# Patient Record
Sex: Female | Born: 1970
Health system: Southern US, Community
[De-identification: ages and names within clinical notes are randomized; demographics above are authoritative.]

## PROBLEM LIST (undated history)

## (undated) DIAGNOSIS — C50919 Malignant neoplasm of unspecified site of unspecified female breast: Secondary | ICD-10-CM

## (undated) DIAGNOSIS — F32A Depression, unspecified: Secondary | ICD-10-CM

## (undated) DIAGNOSIS — K589 Irritable bowel syndrome without diarrhea: Secondary | ICD-10-CM

## (undated) DIAGNOSIS — K219 Gastro-esophageal reflux disease without esophagitis: Secondary | ICD-10-CM

## (undated) DIAGNOSIS — F419 Anxiety disorder, unspecified: Secondary | ICD-10-CM

## (undated) DIAGNOSIS — T7840XA Allergy, unspecified, initial encounter: Secondary | ICD-10-CM

## (undated) DIAGNOSIS — Z86010 Personal history of colonic polyps: Secondary | ICD-10-CM

## (undated) DIAGNOSIS — G4730 Sleep apnea, unspecified: Secondary | ICD-10-CM

## (undated) DIAGNOSIS — H9190 Unspecified hearing loss, unspecified ear: Secondary | ICD-10-CM

## (undated) DIAGNOSIS — E669 Obesity, unspecified: Secondary | ICD-10-CM

## (undated) DIAGNOSIS — F329 Major depressive disorder, single episode, unspecified: Secondary | ICD-10-CM

## (undated) HISTORY — DX: Anxiety disorder, unspecified: F41.9

## (undated) HISTORY — DX: Allergy, unspecified, initial encounter: T78.40XA

## (undated) HISTORY — DX: Personal history of colonic polyps: Z86.010

## (undated) HISTORY — DX: Obesity, unspecified: E66.9

## (undated) HISTORY — DX: Malignant neoplasm of unspecified site of unspecified female breast: C50.919

## (undated) HISTORY — DX: Gastro-esophageal reflux disease without esophagitis: K21.9

## (undated) HISTORY — DX: Sleep apnea, unspecified: G47.30

## (undated) HISTORY — DX: Irritable bowel syndrome, unspecified: K58.9

## (undated) HISTORY — PX: BREAST SURGERY: SHX581

## (undated) HISTORY — DX: Unspecified hearing loss, unspecified ear: H91.90

## (undated) HISTORY — DX: Major depressive disorder, single episode, unspecified: F32.9

## (undated) HISTORY — DX: Depression, unspecified: F32.A

---

## 2000-10-06 ENCOUNTER — Other Ambulatory Visit: Admission: RE | Admit: 2000-10-06 | Discharge: 2000-10-06 | Payer: Self-pay | Admitting: Family Medicine

## 2001-10-08 ENCOUNTER — Other Ambulatory Visit: Admission: RE | Admit: 2001-10-08 | Discharge: 2001-10-08 | Payer: Self-pay | Admitting: Family Medicine

## 2002-02-14 ENCOUNTER — Inpatient Hospital Stay (HOSPITAL_COMMUNITY): Admission: AD | Admit: 2002-02-14 | Discharge: 2002-02-14 | Payer: Self-pay | Admitting: Obstetrics and Gynecology

## 2002-02-14 ENCOUNTER — Encounter: Payer: Self-pay | Admitting: Obstetrics and Gynecology

## 2002-02-17 ENCOUNTER — Encounter: Payer: Self-pay | Admitting: Obstetrics & Gynecology

## 2002-02-17 ENCOUNTER — Inpatient Hospital Stay (HOSPITAL_COMMUNITY): Admission: AD | Admit: 2002-02-17 | Discharge: 2002-02-17 | Payer: Self-pay | Admitting: Obstetrics & Gynecology

## 2002-03-25 HISTORY — PX: OTHER SURGICAL HISTORY: SHX169

## 2002-06-17 ENCOUNTER — Encounter (INDEPENDENT_AMBULATORY_CARE_PROVIDER_SITE_OTHER): Payer: Self-pay | Admitting: Specialist

## 2002-06-17 ENCOUNTER — Ambulatory Visit (HOSPITAL_COMMUNITY): Admission: RE | Admit: 2002-06-17 | Discharge: 2002-06-18 | Payer: Self-pay | Admitting: *Deleted

## 2002-11-09 ENCOUNTER — Other Ambulatory Visit: Admission: RE | Admit: 2002-11-09 | Discharge: 2002-11-09 | Payer: Self-pay | Admitting: Family Medicine

## 2003-01-07 ENCOUNTER — Other Ambulatory Visit: Admission: RE | Admit: 2003-01-07 | Discharge: 2003-01-07 | Payer: Self-pay | Admitting: Obstetrics and Gynecology

## 2003-08-29 ENCOUNTER — Inpatient Hospital Stay (HOSPITAL_COMMUNITY): Admission: AD | Admit: 2003-08-29 | Discharge: 2003-09-01 | Payer: Self-pay | Admitting: Obstetrics and Gynecology

## 2003-08-29 ENCOUNTER — Encounter (INDEPENDENT_AMBULATORY_CARE_PROVIDER_SITE_OTHER): Payer: Self-pay | Admitting: Specialist

## 2003-09-02 ENCOUNTER — Encounter: Admission: RE | Admit: 2003-09-02 | Discharge: 2003-10-02 | Payer: Self-pay | Admitting: Obstetrics and Gynecology

## 2003-11-02 ENCOUNTER — Encounter: Admission: RE | Admit: 2003-11-02 | Discharge: 2003-12-02 | Payer: Self-pay | Admitting: Obstetrics and Gynecology

## 2003-12-03 ENCOUNTER — Encounter: Admission: RE | Admit: 2003-12-03 | Discharge: 2004-01-02 | Payer: Self-pay | Admitting: Obstetrics and Gynecology

## 2004-02-02 ENCOUNTER — Encounter: Admission: RE | Admit: 2004-02-02 | Discharge: 2004-03-03 | Payer: Self-pay | Admitting: Obstetrics and Gynecology

## 2004-04-03 ENCOUNTER — Encounter: Admission: RE | Admit: 2004-04-03 | Discharge: 2004-05-03 | Payer: Self-pay | Admitting: Obstetrics and Gynecology

## 2004-05-04 ENCOUNTER — Encounter: Admission: RE | Admit: 2004-05-04 | Discharge: 2004-06-03 | Payer: Self-pay | Admitting: Obstetrics and Gynecology

## 2004-07-02 ENCOUNTER — Encounter: Admission: RE | Admit: 2004-07-02 | Discharge: 2004-08-01 | Payer: Self-pay | Admitting: Obstetrics and Gynecology

## 2006-01-31 ENCOUNTER — Ambulatory Visit: Payer: Self-pay | Admitting: Internal Medicine

## 2006-02-03 ENCOUNTER — Ambulatory Visit: Payer: Self-pay | Admitting: Internal Medicine

## 2006-04-08 ENCOUNTER — Encounter (INDEPENDENT_AMBULATORY_CARE_PROVIDER_SITE_OTHER): Payer: Self-pay | Admitting: Specialist

## 2006-04-08 ENCOUNTER — Ambulatory Visit (HOSPITAL_COMMUNITY): Admission: RE | Admit: 2006-04-08 | Discharge: 2006-04-08 | Payer: Self-pay | Admitting: General Surgery

## 2007-03-26 HISTORY — PX: CHOLECYSTECTOMY: SHX55

## 2007-05-10 ENCOUNTER — Emergency Department (HOSPITAL_COMMUNITY): Admission: EM | Admit: 2007-05-10 | Discharge: 2007-05-10 | Payer: Self-pay | Admitting: Family Medicine

## 2007-05-15 ENCOUNTER — Emergency Department (HOSPITAL_COMMUNITY): Admission: EM | Admit: 2007-05-15 | Discharge: 2007-05-15 | Payer: Self-pay | Admitting: Family Medicine

## 2007-06-08 ENCOUNTER — Encounter: Payer: Self-pay | Admitting: Cardiology

## 2007-06-08 LAB — CONVERTED CEMR LAB
BUN: 14 mg/dL (ref 6–23)
CO2: 30 meq/L (ref 19–32)
Calcium: 8.8 mg/dL (ref 8.4–10.5)
Chloride: 104 meq/L (ref 96–112)
Creatinine, Ser: 0.6 mg/dL (ref 0.4–1.2)
GFR calc Af Amer: 145 mL/min
GFR calc non Af Amer: 120 mL/min
Glucose, Bld: 105 mg/dL — ABNORMAL HIGH (ref 70–99)
Magnesium: 2.1 mg/dL (ref 1.5–2.5)
Potassium: 4.1 meq/L (ref 3.5–5.1)
Sodium: 139 meq/L (ref 135–145)
TSH: 1.71 microintl units/mL (ref 0.35–5.50)

## 2008-07-05 ENCOUNTER — Inpatient Hospital Stay (HOSPITAL_COMMUNITY): Admission: AD | Admit: 2008-07-05 | Discharge: 2008-07-05 | Payer: Self-pay | Admitting: Obstetrics

## 2008-10-12 ENCOUNTER — Encounter: Admission: RE | Admit: 2008-10-12 | Discharge: 2008-10-12 | Payer: Self-pay | Admitting: Obstetrics and Gynecology

## 2008-12-06 ENCOUNTER — Inpatient Hospital Stay (HOSPITAL_COMMUNITY): Admission: RE | Admit: 2008-12-06 | Discharge: 2008-12-09 | Payer: Self-pay | Admitting: Obstetrics and Gynecology

## 2008-12-10 ENCOUNTER — Encounter: Admission: RE | Admit: 2008-12-10 | Discharge: 2009-01-08 | Payer: Self-pay | Admitting: Obstetrics and Gynecology

## 2009-01-09 ENCOUNTER — Encounter: Admission: RE | Admit: 2009-01-09 | Discharge: 2009-02-08 | Payer: Self-pay | Admitting: Obstetrics and Gynecology

## 2009-02-09 ENCOUNTER — Encounter: Admission: RE | Admit: 2009-02-09 | Discharge: 2009-03-10 | Payer: Self-pay | Admitting: Obstetrics and Gynecology

## 2009-03-11 ENCOUNTER — Encounter: Admission: RE | Admit: 2009-03-11 | Discharge: 2009-03-23 | Payer: Self-pay | Admitting: Obstetrics and Gynecology

## 2009-04-11 ENCOUNTER — Encounter: Admission: RE | Admit: 2009-04-11 | Discharge: 2009-05-11 | Payer: Self-pay | Admitting: Obstetrics and Gynecology

## 2009-04-21 ENCOUNTER — Emergency Department (HOSPITAL_COMMUNITY): Admission: EM | Admit: 2009-04-21 | Discharge: 2009-04-21 | Payer: Self-pay | Admitting: Family Medicine

## 2009-05-12 ENCOUNTER — Encounter: Admission: RE | Admit: 2009-05-12 | Discharge: 2009-06-11 | Payer: Self-pay | Admitting: Obstetrics and Gynecology

## 2009-07-10 ENCOUNTER — Encounter: Admission: RE | Admit: 2009-07-10 | Discharge: 2009-08-09 | Payer: Self-pay | Admitting: Obstetrics and Gynecology

## 2009-08-10 ENCOUNTER — Encounter: Admission: RE | Admit: 2009-08-10 | Discharge: 2009-09-09 | Payer: Self-pay | Admitting: Obstetrics and Gynecology

## 2009-09-10 ENCOUNTER — Encounter: Admission: RE | Admit: 2009-09-10 | Discharge: 2009-10-10 | Payer: Self-pay | Admitting: Obstetrics and Gynecology

## 2009-10-11 ENCOUNTER — Encounter: Admission: RE | Admit: 2009-10-11 | Discharge: 2009-11-06 | Payer: Self-pay | Admitting: Obstetrics and Gynecology

## 2010-01-23 ENCOUNTER — Encounter: Payer: Self-pay | Admitting: Internal Medicine

## 2010-01-23 LAB — CONVERTED CEMR LAB
ALT: 15 units/L
AST: 15 units/L
Albumin: 4.3 g/dL
Alkaline Phosphatase: 66 units/L
BUN: 10 mg/dL
CO2: 25 meq/L
Calcium: 9.3 mg/dL
Chloride: 101 meq/L
Cholesterol: 147 mg/dL
Creatinine, Ser: 0.6 mg/dL
Glucose, Bld: 86 mg/dL
HDL: 41 mg/dL
Hemoglobin: 13.4 g/dL
LDL Cholesterol: 84 mg/dL
Platelets: 365 10*3/uL
Potassium: 4.4 meq/L
Sodium: 137 meq/L
TSH: 1.3 microintl units/mL
Total Bilirubin: 0.5 mg/dL
Total Protein: 7 g/dL
Triglyceride fasting, serum: 110 mg/dL
WBC: 8.4 10*3/uL

## 2010-02-26 ENCOUNTER — Telehealth: Payer: Self-pay | Admitting: Internal Medicine

## 2010-03-06 ENCOUNTER — Ambulatory Visit: Payer: Self-pay | Admitting: Internal Medicine

## 2010-03-06 DIAGNOSIS — K589 Irritable bowel syndrome without diarrhea: Secondary | ICD-10-CM | POA: Insufficient documentation

## 2010-03-06 DIAGNOSIS — R109 Unspecified abdominal pain: Secondary | ICD-10-CM | POA: Insufficient documentation

## 2010-03-06 DIAGNOSIS — E669 Obesity, unspecified: Secondary | ICD-10-CM | POA: Insufficient documentation

## 2010-03-14 ENCOUNTER — Ambulatory Visit: Payer: Self-pay | Admitting: Internal Medicine

## 2010-03-14 DIAGNOSIS — H9190 Unspecified hearing loss, unspecified ear: Secondary | ICD-10-CM | POA: Insufficient documentation

## 2010-04-12 ENCOUNTER — Encounter: Payer: Self-pay | Admitting: Internal Medicine

## 2010-04-18 ENCOUNTER — Encounter: Payer: Self-pay | Admitting: Internal Medicine

## 2010-04-24 NOTE — Progress Notes (Signed)
Summary: Triage  Phone Note Call from Patient Call back at [phone] Cell in Epic training this morning   Caller: Patient Call For: Dr. Leone Payor Reason for Call: Talk to Nurse Summary of Call: having upper abd pain radiating to her back...requesting sooner appt. than her appt on 04-13-10  Initial call taken by: Karna Christmas,  February 26, 2010 9:12 AM  Follow-up for Phone Call        Spoke with patient and she is concerned because she is having pain where her "gallbladder used to be." States she is under a lot of stress right now and would like to be seen sooner than her January appointment. Offered an appointment with the NP but she states she wants to be seen by the Doctor. Gave patient an appointment to see Dr. Leone Payor 03/06/10 at 10:45am. Selinda Michaels, RN Follow-up by: Darcey Nora RN, CGRN,  February 26, 2010 9:41 AM

## 2010-04-26 NOTE — Assessment & Plan Note (Signed)
Summary: new pt/umr/#/lb   Vital Signs:  Patient profile:   40 year old female Height:      64 inches (162.56 cm) Weight:      209.8 pounds (95.36 kg) O2 Sat:      95 % on Room air Temp:     98.7 degrees F (37.06 degrees C) oral Pulse rate:   81 / minute BP sitting:   112 / 72  (left arm) Cuff size:   large  Vitals Entered By: Orlan Leavens RMA (March 14, 2010 3:05 PM)  O2 Flow:  Room air CC: New patient Is Patient Diabetic? No Pain Assessment Patient in pain? no        Primary Care Provider:  Josephine Cables, MD   CC:  New patient.  History of Present Illness: new pt to me and our division, here to est care  1) obesity - long hx same, worse since pregnancy and diet/cooking change complicated by limited time for exercise due to stress/job, etc planning diet changes and increased exercise efforts -  has never tried rx med for appetite suppression but would consider trying same +gest DM and FH DM, no OSA, OA - no thyroid hx   2) IBS symptoms - seeing GI for same - has not yet tried hycosamine, currently symptoms improved  3) anxiety - occ panic attacks - symptoms worse when under stress never on rx meds or counseling for same planning to work on healthier balance of time mgmt for better control of symptoms   Preventive Screening-Counseling & Management  Alcohol-Tobacco     Alcohol drinks/day: <1     Alcohol type: wine     Alcohol Counseling: not indicated; use of alcohol is not excessive or problematic     Smoking Status: never     Tobacco Counseling: not indicated; no tobacco use  Caffeine-Diet-Exercise     Diet Counseling: to improve diet; diet is suboptimal     Does Patient Exercise: no     Exercise Counseling: to improve exercise regimen     Depression Counseling: not indicated; screening negative for depression  Safety-Violence-Falls     Seat Belt Counseling: not indicated; patient wears seat belts     Helmet Counseling: not indicated; patient wears  helmet when riding bicycle/motocycle     Firearm Counseling: not indicated; uses recommended firearm safety measures     Violence Counseling: not indicated; no violence risk noted     Fall Risk Counseling: not indicated; no significant falls noted  Clinical Review Panels:  Immunizations   Last Tetanus Booster:  Historical (03/26/2003)   Last Flu Vaccine:  Historical (01/23/2010)  Lipid Management   Cholesterol:  147 (01/23/2010)   LDL (bad choesterol):  84 (01/23/2010)   HDL (good cholesterol):  41.0 (01/23/2010)   Triglycerides:  110 (01/23/2010)  Diabetes Management   Creatinine:  0.6 (01/23/2010)   Last Flu Vaccine:  Historical (01/23/2010)  CBC   WBC:  8.4 (01/23/2010)   Hgb:  13.4 (01/23/2010)   Platelets:  365 (01/23/2010)  Complete Metabolic Panel   Glucose:  86 (01/23/2010)   Sodium:  137 (01/23/2010)   Potassium:  4.4 (01/23/2010)   Chloride:  101 (01/23/2010)   CO2:  25 (01/23/2010)   BUN:  10 (01/23/2010)   Creatinine:  0.6 (01/23/2010)   Albumin:  4.3 (01/23/2010)   Total Protein:  7.0 (01/23/2010)   Calcium:  9.3 (01/23/2010)   Total Bili:  0.5 (01/23/2010)   Alk Phos:  66 (01/23/2010)  SGPT (ALT):  15 (01/23/2010)   SGOT (AST):  15 (01/23/2010)   -  Date:  01/23/2010    Cholesterol: 147    LDL: 84    HDL: 41.0    Triglycerides: 110    TSH: 1.3    BG Random: 86    BUN: 10    Creatinine: 0.6    Sodium: 137    Potassium: 4.4    Chloride: 101    CO2 Total: 25    SGOT (AST): 15    SGPT (ALT): 15    T. Bilirubin: 0.5    Alk Phos: 66    Calcium: 9.3    Total Protein: 7.0    Albumin: 4.3    WBC: 8.4    HGB: 13.4    PLT: 365  Current Medications (verified): 1)  Tums 500 Mg Chew (Calcium Carbonate Antacid) .... 4-6 As Needed 2)  One-A-Day Womens Formula  Tabs (Multiple Vitamins-Calcium) .... One Tablet By Mouth Once Daily 3)  Gas-X 80 Mg Chew (Simethicone) .... As Needed 4)  Tylenol 325 Mg Tabs (Acetaminophen) .... As Needed 5)  Sudafed  30 Mg Tabs (Pseudoephedrine Hcl) .... As Needed 6)  Aleve 220 Mg Tabs (Naproxen Sodium) .... As Needed 7)  Hyoscyamine Sulfate 0.125 Mg Tabs (Hyoscyamine Sulfate) .... 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  Alcohol Use - yes: rare Daily Caffeine Use: coffee daily   Illicit Drug Use - no Does Patient Exercise:  no  Review of Systems       c/o hearing loss, gradual over past 6 months - otherwise, see HPI above. I have reviewed all other systems and they were negative.   Physical Exam  General:  overweight-appearing.  alert, well-developed, well-nourished, and cooperative to examination.    Head:  Normocephalic and atraumatic without obvious abnormalities. No apparent alopecia or balding. Eyes:  wears correcetive glasses - vision grossly intact; pupils equal, round and reactive to light.  conjunctiva and lids normal.    Ears:  normal pinnae bilaterally, without erythema, swelling, or tenderness to palpation. TMs clear, without effusion, or cerumen impaction. Hearing grossly normal bilaterally  Mouth:  teeth and gums in good repair; mucous membranes moist, without lesions or ulcers. oropharynx clear without exudate, no erythema.  Lungs:  normal respiratory effort, no intercostal retractions or use of accessory muscles; normal breath sounds bilaterally - no  crackles and no wheezes.    Heart:  normal rate, regular rhythm, no murmur, and no rub. BLE without edema. Psych:  Oriented X3, memory intact for recent and remote, normally interactive, good eye contact, not anxious appearing, not depressed appearing, and not agitated.      Impression & Recommendations:  Problem # 1:  OBESITY (ICD-278.00) discussed need for weight reduction with improved diet and exercise efforts - trial phenteramine ok - though advised tx may exac anxiety symptoms and to stop if psyc symptoms worse - rx done  Problem # 2:  IRRITABLE BOWEL SYNDROME (ICD-564.1) per GI - ok to try probiotic as needed - rec Align  Problem # 3:  HEARING LOSS (ICD-389.9) exam benign - send for hearing eval Orders: Audiology (Audio)  Complete Medication List: 1)  Tums 500 Mg Chew (Calcium carbonate antacid) .... 4-6 as needed 2)  One-a-day Womens Formula Tabs (Multiple vitamins-calcium) .... One tablet by mouth once daily 3)  Gas-x 80 Mg Chew (Simethicone) .... As needed 4)  Tylenol 325 Mg Tabs (Acetaminophen) .... As needed 5)  Sudafed 30 Mg Tabs (Pseudoephedrine hcl) .... As needed 6)  Aleve 220 Mg Tabs (Naproxen sodium) .... As needed 7)  Hyoscyamine Sulfate 0.125 Mg Tabs (Hyoscyamine sulfate) .... Take 1-2 tablet by mouth every 4 hours as needed for abdominal pain 8)  Caltrate 600+d Plus 600-400 Mg-unit Tabs (Calcium carbonate-vit d-min) .Marland Kitchen.. 1 by mouth once daily 9)  Phentermine Hcl 37.5 Mg Caps (Phentermine hcl) .Marland Kitchen.. 1 by mouth once daily for appetite suppression 10)  Align Caps (Probiotic product) .Marland Kitchen.. 1 by mouth once daily  Patient Instructions: 1)  it was good to see you today. 2)  medical history reviewed -  3)  add additional calcium as discussed - 4)  phentermine for appetite suppression - use as discussed - stop if worsening anxiety or panic attacks - will need office visit for weigh in and symptom/med review before refills will be provided - also max 3 months use (as  prescribed by me even if good weight loss results) 5)  we'll make referral to [facility] for hearing eval. Our office will contact you regarding this appointment once made.  6)  Please schedule a follow-up appointment annually for medical physical and labs, call sooner if problems.  Prescriptions: PHENTERMINE HCL 37.5 MG CAPS (PHENTERMINE HCL) 1 by mouth once daily for appetite suppression  #30 x 0   Entered and Authorized by:   Newt Lukes MD   Signed by:   Newt Lukes MD on 03/14/2010   Method used:   Print then Give to Patient   RxID:   [id number]    Orders Added: 1)  Audiology [Audio] 2)  New Patient Level III [30160]   Immunization History:  Tetanus/Td Immunization History:    Tetanus/Td:  historical (03/26/2003)  Influenza Immunization History:    Influenza:  historical (01/23/2010)   Immunization History:  Tetanus/Td Immunization History:    Tetanus/Td:  Historical (03/26/2003)  Influenza Immunization History:    Influenza:  Historical (01/23/2010)

## 2010-04-26 NOTE — Assessment & Plan Note (Signed)
Summary: Abdominal Pain/[facility]   History of Present Illness Visit Type: Follow-up Visit Primary GI MD: Stan Head MD [facility] Primary Provider: Josephine Cables, MD  Requesting Provider: na Chief Complaint: Upper abd pain  History of Present Illness:   40 yo ww with abdomnal pain problems in RUQ. Similar to prior pain attributed to gallbladder.  She has gained 20# in the last year. She had 2 c-sxns and says that abdominal wll is weak.  The pain is under band of bra (episodic and 2-4 hours after a meal, dull ache)and also pain in both upper quadrants/flanks (intermittent and no pattern or triggers)  She has chronic back pain she relates to siting and 65 month old child (picking up)  Stress will affect her and she may eat poorly and have loose stools at times. Increased dairy products may do this as well as nuts, greasy foods.   She did very well for years after cholecystectomy,  she noticed the abdominal pain occurring mostly over the last few months.`    GI Review of Systems    Reports abdominal pain.     Location of  Abdominal pain: upper abdomen.    Denies acid reflux, belching, bloating, chest pain, dysphagia with liquids, dysphagia with solids, heartburn, loss of appetite, nausea, vomiting, vomiting blood, weight loss, and  weight gain.        Denies anal fissure, black tarry stools, change in bowel habit, constipation, diarrhea, diverticulosis, fecal incontinence, heme positive stool, hemorrhoids, irritable bowel syndrome, jaundice, light color stool, liver problems, rectal bleeding, and  rectal pain.    Current Medications (verified): 1)  Tums 500 Mg Chew (Calcium Carbonate Antacid) .... 4-6 As Needed 2)  One-A-Day Womens Formula  Tabs (Multiple Vitamins-Calcium) .... One Tablet By Mouth Once Daily 3)  Gas-X 80 Mg Chew (Simethicone) .... As Needed 4)  Tylenol 325 Mg Tabs (Acetaminophen) .... As Needed 5)  Sudafed 30 Mg Tabs (Pseudoephedrine Hcl) .... As Needed 6)  Aleve 220  Mg Tabs (Naproxen Sodium) .... 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  Alcohol Use - yes: rare Daily Caffeine Use: coffee daily  Illicit Drug Use - no Smoking Status:  never Drug Use:  no  Review of Systems       The patient complains of allergy/sinus and back pain.         All other ROS negative except as per HPI.   Vital Signs:  Patient profile:   40 year old female Height:      64 inches Weight:      206 pounds BMI:     35.49 BSA:     1.98 Pulse rate:   88 / minute Pulse rhythm:   regular BP sitting:   128 / 76  (left arm) Cuff size:   regular  Vitals Entered By: Ok Anis CMA (March 06, 2010 11:23 AM)  Physical Exam  General:  obese.  NAD Eyes:  PERRLA, no icterus. Mouth:  No deformity or lesions, dentition normal. Neck:  Supple; no masses or thyromegaly. Lungs:  Clear throughout to auscultation. Heart:  Regular rate and rhythm; no murmurs, rubs,  or bruits. Abdomen:  obese, soft mildly tender overall without HSM or mass Extremities:  no edema Cervical Nodes:  No significant cervical or supraclavicular adenopathy.  Psych:  Alert and cooperative. Normal mood and affect.  CBC, CMET TSH and lipids ok 01/23/10  Impression & Recommendations:  Problem # 1:  ABDOMINAL PAIN-MULTIPLE SITES (ICD-789.09) likely IBS and weight related no worrisome features plan for weight loss, work on back/core muscles, anti-spasmodic - hyoscyamine if this fails to help then consider imaging/endoscopic evaluation her health screening labs were ok and will be scanned into chart (Labs 11/1)  Problem # 2:  IRRITABLE BOWEL SYNDROME (ICD-564.1) Assessment: New Overall, I  think her GI complaints are mostly compatible with this and some post-chole symptoms.  Problem # 3:  OBESITY (ICD-278.00) Assessment: New she is aware it is a problem and of need to lose weight preliminary discussions held possibly weight watchers though fiber can increase GI sxs try to do simple things now and follow-up with Dr. Felicity Coyer about this (will be seeing her soon to establish care).  Patient Instructions: 1)  You need to lose weight. Start by limiting portions, amounts. Avoid eating when not hungry. Limit desserts.Look for high fructose corn syrup on food labels and if in first 3 ingredients, avoid that food. Also try to eat whole grains, avoid "white foods" (e.g. white rice, white bread).   2)  Discuss further weight loss options with Dr. Felicity Coyer. 3)  Reduce caffeine (it aggravates IBS) 4)  Ask about a core muscle exercise program also, elping your back muscles should help overall. 5)  Please schedule a follow-up appointment as needed.  6)  Please pick up your medications at your pharmacy. Hyoscyamine 7)  The medication list was reviewed and reconciled.  All changed / newly prescribed medications were explained.  A complete medication list was provided to the patient / caregiver. Prescriptions: HYOSCYAMINE SULFATE 0.125 MG TABS (HYOSCYAMINE SULFATE) take 1-2 tablet by mouth every 4 hours as needed for abdominal pain  #60 x 1   Entered by:   Francee Piccolo CMA (AAMA)   Authorized by:   Iva Boop MD, [facility]   Signed by:   Francee Piccolo CMA (AAMA) on 03/06/2010   Method used:   [facility] to        [facility]  [address] [phone] (retail)       [address]       [place], [place]  [address]       Ph: [phone] or [phone]       Fax: [phone]   RxID:   [id number]

## 2010-05-09 ENCOUNTER — Telehealth: Payer: Self-pay | Admitting: Internal Medicine

## 2010-05-16 NOTE — Progress Notes (Signed)
Summary: ?dizziness  Phone Note Call from Patient Call back at [phone]   Caller: Patient Reason for Call: Talk to Doctor Summary of Call: Pt left message on triage stating that she is having episodes of dizziness and nausea when turning her head. Pt states that sxs started last Saturday when she was reading a book on a car ride and looked up-pt had dizziness that went away after about 10 minutes. Pt also had nausea and dizziness late Sunday night while laying in bed. Pt states that today she is having the same sxs after turning her head and also is having stiffness on her left side (neck and arm) and that her left toe is "not picking up"-please advise Initial call taken by: Ashley Johnson CMA (AAMA),  May 09, 2010 2:40 PM  Follow-up for Phone Call        overall, her symptoms sound like positional vertigo - would rec trial of meclizine for same - if pt agrees, may send erx; if symptoms not improved or getting worse, would need OV to formally eval symptoms - thanks Follow-up by: Valerie A Leschber MD,  May 09, 2010 4:28 PM  Additional Follow-up for Phone Call Additional follow up Details #1::        Notified pt with md response. [facility] Additional Follow-up by: Lucy Brand RMA,  May 09, 2010 4:39 PM    New/Updated Medications: MECLIZINE HCL 25 MG TABS (MECLIZINE HCL) 1 by mouth every 4 hours as needed for dizzy symptoms Prescriptions: MECLIZINE HCL 25 MG TABS (MECLIZINE HCL) 1 by mouth every 4 hours as needed for dizzy symptoms  #20 x 0   Entered by:   Lucy Brand RMA   Authorized by:   Valerie A Leschber MD   Signed by:   Lucy Brand RMA on 05/09/2010   Method used:   [facility]        [facility] [address] [id number]       [address]       [place], [place]  [address]       Ph: [phone] or [phone]       Fax: [phone]   RxID:   [id number] MECLIZINE HCL 25 MG TABS (MECLIZINE HCL) 1 by mouth every 4 hours as needed  for dizzy symptoms  #20 x 0   Entered and Authorized by:   Newt Lukes MD   Signed by:   Newt Lukes MD on 05/09/2010   Method used:   Historical   RxID:   [id number]

## 2010-05-23 ENCOUNTER — Inpatient Hospital Stay (INDEPENDENT_AMBULATORY_CARE_PROVIDER_SITE_OTHER)
Admission: RE | Admit: 2010-05-23 | Discharge: 2010-05-23 | Disposition: A | Discharge status: Home / Self Care | Payer: 59 | Source: Ambulatory Visit | Attending: Emergency Medicine | Admitting: Emergency Medicine

## 2010-05-23 ENCOUNTER — Emergency Department (HOSPITAL_COMMUNITY): Payer: 59

## 2010-05-23 ENCOUNTER — Emergency Department (HOSPITAL_COMMUNITY)
Admission: EM | Admit: 2010-05-23 | Discharge: 2010-05-23 | Disposition: A | Discharge status: Home / Self Care | Payer: 59 | Attending: Emergency Medicine | Admitting: Emergency Medicine

## 2010-05-23 ENCOUNTER — Telehealth: Payer: Self-pay | Admitting: Internal Medicine

## 2010-05-23 DIAGNOSIS — R209 Unspecified disturbances of skin sensation: Secondary | ICD-10-CM | POA: Insufficient documentation

## 2010-05-23 DIAGNOSIS — Z79899 Other long term (current) drug therapy: Secondary | ICD-10-CM | POA: Insufficient documentation

## 2010-05-23 DIAGNOSIS — M7989 Other specified soft tissue disorders: Secondary | ICD-10-CM | POA: Insufficient documentation

## 2010-05-23 DIAGNOSIS — M6281 Muscle weakness (generalized): Secondary | ICD-10-CM | POA: Insufficient documentation

## 2010-05-23 DIAGNOSIS — R279 Unspecified lack of coordination: Secondary | ICD-10-CM

## 2010-05-23 DIAGNOSIS — R51 Headache: Secondary | ICD-10-CM | POA: Insufficient documentation

## 2010-05-23 DIAGNOSIS — M25649 Stiffness of unspecified hand, not elsewhere classified: Secondary | ICD-10-CM | POA: Insufficient documentation

## 2010-05-23 LAB — DIFFERENTIAL
Basophils Relative: 0 % (ref 0–1)
Eosinophils Absolute: 0.3 10*3/uL (ref 0.0–0.7)
Eosinophils Relative: 3 % (ref 0–5)
Lymphs Abs: 3.6 10*3/uL (ref 0.7–4.0)
Monocytes Relative: 7 % (ref 3–12)
Neutrophils Relative %: 55 % (ref 43–77)

## 2010-05-23 LAB — BASIC METABOLIC PANEL
BUN: 6 mg/dL (ref 6–23)
Calcium: 8.8 mg/dL (ref 8.4–10.5)
Chloride: 104 mEq/L (ref 96–112)
Creatinine, Ser: 0.53 mg/dL (ref 0.4–1.2)
GFR calc non Af Amer: >60 mL/min (ref >60)

## 2010-05-23 LAB — CBC
MCH: 31.6 pg (ref 26.0–34.0)
MCHC: 33.6 g/dL (ref 30.0–36.0)
MCV: 94.2 fL (ref 78.0–100.0)
Platelets: 315 10*3/uL (ref 150–400)
RDW: 12.4 % (ref 11.5–15.5)

## 2010-05-23 LAB — URINALYSIS, ROUTINE W REFLEX MICROSCOPIC
Bilirubin Urine: NEGATIVE
Ketones, ur: 15 mg/dL — AB
Nitrite: NEGATIVE
pH: 6.5 (ref 5.0–8.0)

## 2010-05-23 LAB — POCT PREGNANCY, URINE: Preg Test, Ur: NEGATIVE

## 2010-05-24 ENCOUNTER — Ambulatory Visit (INDEPENDENT_AMBULATORY_CARE_PROVIDER_SITE_OTHER): Payer: 59 | Admitting: Internal Medicine

## 2010-05-24 ENCOUNTER — Encounter: Payer: Self-pay | Admitting: Internal Medicine

## 2010-05-24 ENCOUNTER — Other Ambulatory Visit: Payer: Self-pay | Admitting: Internal Medicine

## 2010-05-24 ENCOUNTER — Other Ambulatory Visit: Payer: 59

## 2010-05-24 DIAGNOSIS — R109 Unspecified abdominal pain: Secondary | ICD-10-CM

## 2010-05-24 DIAGNOSIS — R209 Unspecified disturbances of skin sensation: Secondary | ICD-10-CM

## 2010-05-24 LAB — CONVERTED CEMR LAB
BUN: 6 mg/dL
Chloride: 104 meq/L
Creatinine, Ser: 0.53 mg/dL
HCT: 38.7 %
Hemoglobin: 13 g/dL
MCV: 94.2 fL
Platelets: 315 10*3/uL

## 2010-05-24 LAB — B12 AND FOLATE PANEL: Vitamin B-12: 258 pg/mL (ref 211–911)

## 2010-05-31 NOTE — Assessment & Plan Note (Signed)
Summary: continued back pain & weakness/SD   Vital Signs:  Patient profile:   40 year old female Weight:      202.4 pounds (92 kg) O2 Sat:      97 % on Room air Temp:     98.8 degrees F (37.11 degrees C) oral Pulse rate:   87 / minute BP sitting:   98 / 74  (left arm) Cuff size:   large  Vitals Entered By: Orlan Leavens RMA (May 24, 2010 9:21 AM)  O2 Flow:  Room air CC: Ongoing back pain & weakness Is Patient Diabetic? No Pain Assessment Patient in pain? yes     Location: back Type: aching   Primary Care Provider:  Josephine Cables, MD   CC:  Ongoing back pain & weakness.  History of Present Illness: c/o intermittent weakness - L side > R body, LE>UE ongoing for months, worse in past 2 weeks - trips on left toes while walking, B hand and toe numbness constantly present ?precipitated exac by recent URI and residual fatigue no fever, no falls but feels off balance no neck pain or HA seen in ER last PM for same - normal labs and MRI brain - reviewed today  also continued abd pain and back pain 2 hours after eating no n/v/bowel change ?pancreatitis  reviewed other med issues: obesity - long hx same, worse since pregnancy and diet/cooking change complicated by limited time for exercise due to stress/job, etc planning diet changes and increased exercise efforts -  has never tried rx med for appetite suppression but would consider trying same +gest DM and FH DM, no OSA, OA - no thyroid hx   IBS symptoms - seeing GI for same - has not yet tried hycosamine, currently symptoms improved  anxiety - occ panic attacks - symptoms worse when under stress never on rx meds or counseling for same planning to work on healthier balance of time mgmt for better control of symptoms   Clinical Review Panels:  Prevention   Last Mammogram:  done @ [facility]  Findings: Asymmetric density posteriorly in the lateral aspect of the left breast appears to efface on addtional images and  is likely related to summation artifact.  Impression:  BI-RADS 3 Probably benign findings. Plan to repeat in 6 months (04/18/2010)  Lipid Management   Cholesterol:  147 (01/23/2010)   LDL (bad choesterol):  84 (01/23/2010)   HDL (good cholesterol):  41.0 (01/23/2010)   Triglycerides:  110 (01/23/2010)  CBC   WBC:  10.1 (05/24/2010)   RBC:  4.11 (05/24/2010)   Hgb:  13.0 (05/24/2010)   Hct:  38.7 (05/24/2010)   Platelets:  315 (05/24/2010)   MCV  94.2 (05/24/2010)   RDW  12.4 (05/24/2010)  Complete Metabolic Panel   Glucose:  88 (05/24/2010)   Sodium:  139 (05/24/2010)   Potassium:  3.7 (05/24/2010)   Chloride:  104 (05/24/2010)   CO2:  28 (05/24/2010)   BUN:  6 (05/24/2010)   Creatinine:  0.53 (05/24/2010)   Albumin:  4.3 (01/23/2010)   Total Protein:  7.0 (01/23/2010)   Calcium:  8.8 (05/24/2010)   Total Bili:  0.5 (01/23/2010)   Alk Phos:  66 (01/23/2010)   SGPT (ALT):  15 (01/23/2010)   SGOT (AST):  15 (01/23/2010)   -  Date:  05/24/2010    BG Random: 88    BUN: 6    Creatinine: 0.53    Sodium: 139    Potassium: 3.7  Chloride: 104    CO2 Total: 28    Calcium: 8.8    WBC: 10.1    HGB: 13.0    HCT: 38.7    RBC: 4.11    PLT: 315    MCV: 94.2    RDW: 12.4  Current Medications (verified): 1)  Tums 500 Mg Chew (Calcium Carbonate Antacid) .... 4-6 As Needed 2)  One-A-Day Womens Formula  Tabs (Multiple Vitamins-Calcium) .... One Tablet By Mouth Once Daily 3)  Gas-X 80 Mg Chew (Simethicone) .... As Needed 4)  Tylenol 325 Mg Tabs (Acetaminophen) .... As Needed 5)  Sudafed 30 Mg Tabs (Pseudoephedrine Hcl) .... As Needed 6)  Aleve 220 Mg Tabs (Naproxen Sodium) .... As Needed 7)  Hyoscyamine Sulfate 0.125 Mg Tabs (Hyoscyamine Sulfate) .... Take 1-2 Tablet By Mouth Every 4 Hours As Needed For Abdominal Pain 8)  Caltrate 600+d Plus 600-400 Mg-Unit Tabs (Calcium Carbonate-Vit D-Min) .Marland Kitchen.. 1 By Mouth Once Daily 9)  Phentermine Hcl 37.5 Mg Caps (Phentermine Hcl)  .Marland Kitchen.. 1 By Mouth Once Daily For Appetite Suppression 10)  Align  Caps (Probiotic Product) .Marland Kitchen.. 1 By Mouth Once Daily 11)  Meclizine Hcl 25 Mg Tabs (Meclizine Hcl) .Marland Kitchen.. 1 By Mouth Every 4 Hours As Needed For Dizzy Symptoms  Allergies (verified): 1)  Iodine 2)  Sulfa  Past History:  Past Medical History: Allergies GERD  Obesity gestestional diabetes hx - 2006 IBS  Past Surgical History: C-Section x 2 ENT Surgery 2004  Cholecystectomy   Review of Systems       The patient complains of difficulty walking.  The patient denies fever and headaches.    Physical Exam  General:  overweight-appearing.  alert, well-developed, well-nourished, and cooperative to examination.    Lungs:  normal respiratory effort, no intercostal retractions or use of accessory muscles; normal breath sounds bilaterally - no crackles and no wheezes.    Heart:  normal rate, regular rhythm, no murmur, and no rub. BLE without edema. Msk:  full range of motion of cervical and lumbar spine. Nontender to palpation. Deep tendon reflexes symmetrically intact. Sensation diminished distal toes B and B hands (glove pattern). Full strength to manual muscle testing in all major groups including symetric hand grips. Able to heel and toe walk without difficulty and ambulates with a normal gait.    Impression & Recommendations:  Problem # 1:  NUMBNESS (ICD-782.0) numbness and subjective affects hands and feet, L>R side - strength normal on exam today MRI brain reviewed fromER visit last PM 05/23/10 -- normal r/o metabolic or nutritional neuropathy with labs now if labs normal, arrange for PNCS and EMG to eval if peripheral compressive neuropathy or poss radiculopathy causing symptoms  suspect UE= CTS  Orders: TLB-TSH (Thyroid Stimulating Hormone) (84443-TSH) TLB-B12 + Folate Pnl (16109_60454-U98/JXB)  Problem # 2:  ABDOMINAL PAIN-MULTIPLE SITES (ICD-789.09)  Her updated medication list for this problem includes:     Tylenol 325 Mg Tabs (Acetaminophen) .Marland Kitchen... As needed    Aleve 220 Mg Tabs (Naproxen sodium) .Marland Kitchen... As needed  Orders: TLB-Lipase (83690-LIPASE)  likely IBS and weight related no worrisome features but check lipase to r/o pancreatits  per prior GI eval: plan for weight loss, work on back/core muscles, anti-spasmodic - hyoscyamine if this fails to help then consider imaging/endoscopic evaluation her health screening labs were ok  (Labs 01/23/10)  Complete Medication List: 1)  Tums 500 Mg Chew (Calcium carbonate antacid) .... 4-6 as needed 2)  One-a-day Womens Formula Tabs (Multiple vitamins-calcium) .... One tablet by  mouth once daily 3)  Gas-x 80 Mg Chew (Simethicone) .... As needed 4)  Tylenol 325 Mg Tabs (Acetaminophen) .... As needed 5)  Sudafed 30 Mg Tabs (Pseudoephedrine hcl) .... As needed 6)  Aleve 220 Mg Tabs (Naproxen sodium) .... As needed 7)  Hyoscyamine Sulfate 0.125 Mg Tabs (Hyoscyamine sulfate) .... Take 1-2 tablet by mouth every 4 hours as needed for abdominal pain 8)  Caltrate 600+d Plus 600-400 Mg-unit Tabs (Calcium carbonate-vit d-min) .Marland Kitchen.. 1 by mouth once daily 9)  Phentermine Hcl 37.5 Mg Caps (Phentermine hcl) .Marland Kitchen.. 1 by mouth once daily for appetite suppression 10)  Align Caps (Probiotic product) .Marland Kitchen.. 1 by mouth once daily 11)  Meclizine Hcl 25 Mg Tabs (Meclizine hcl) .Marland Kitchen.. 1 by mouth every 4 hours as needed for dizzy symptoms  Patient Instructions: 1)  it was good to see you today. 2)  ER eval and tests reviewed -  3)  test(s) ordered today - your results will be called to you after review - if normal, will plan nerve conduction studies as discussed 4)  Please schedule a follow-up appointment in 4-6 months to review, call sooner if problems.    Orders Added: 1)  TLB-Lipase [83690-LIPASE] 2)  TLB-TSH (Thyroid Stimulating Hormone) [84443-TSH] 3)  TLB-B12 + Folate Pnl [[id number]-B12/FOL] 4)  Est. Patient Level IV [73710]      MRI Brain  Procedure date:   05/24/2010  Findings:      Exam Type: MRI Head without contrast Impression: Normal noncontrast MRI appearance of the brain for age

## 2010-05-31 NOTE — Progress Notes (Signed)
Summary: Donna Robbins AM  Phone Note Call from Patient Call back at [phone]   Summary of Call: Spoke w/pt. She is c/o severe back pain in her upper back - describes it as going around to chest equally both sides. Pain is not new, it has been off and on for mth or so. No SOB or CP. Aleve has given pt some relief.  Also very worried about weakness in her arms and legs. Takes considerable effort to lift her feet to walk - symptoms off and on x mth or so. No bowel changes. BP yesterday durig these symptoms was 110/70, cbg 85. Pt is very worried that this may be neurological. She has not taken meclizine. She has also not taken GI med prescribed b/c it makes her dizzy.   Advised pt to monitor symptoms and call office with any changes. Scheduled her for office visit tomorrow for eval and told her we would call back with any further advisement from MD Initial call taken by: Lamar Sprinkles, CMA,  May 23, 2010 12:58 PM  Follow-up for Phone Call        will see at ov  Follow-up by: Newt Lukes MD,  May 23, 2010 5:03 PM

## 2010-06-29 LAB — BASIC METABOLIC PANEL
Calcium: 8.6 mg/dL (ref 8.4–10.5)
Creatinine, Ser: 0.5 mg/dL (ref 0.4–1.2)
GFR calc Af Amer: >60 mL/min (ref >60)
GFR calc non Af Amer: >60 mL/min (ref >60)

## 2010-06-29 LAB — CBC
MCHC: 34.3 g/dL (ref 30.0–36.0)
Platelets: 234 10*3/uL (ref 150–400)
Platelets: 290 10*3/uL (ref 150–400)
RBC: 3.89 MIL/uL (ref 3.87–5.11)
RDW: 13.5 % (ref 11.5–15.5)
RDW: 13.5 % (ref 11.5–15.5)

## 2010-06-29 LAB — RPR: RPR Ser Ql: NONREACTIVE

## 2010-06-29 LAB — CCBB MATERNAL DONOR DRAW

## 2010-06-29 LAB — GLUCOSE, CAPILLARY: Glucose-Capillary: 70 mg/dL (ref 70–99)

## 2010-07-04 LAB — URINALYSIS, ROUTINE W REFLEX MICROSCOPIC
Bilirubin Urine: NEGATIVE
Nitrite: NEGATIVE
Protein, ur: NEGATIVE mg/dL
Specific Gravity, Urine: <1.005 — ABNORMAL LOW (ref 1.005–1.030)
Urobilinogen, UA: 0.2 mg/dL (ref 0.0–1.0)

## 2010-07-04 LAB — WET PREP, GENITAL
Trich, Wet Prep: NONE SEEN
Yeast Wet Prep HPF POC: NONE SEEN

## 2010-07-19 ENCOUNTER — Encounter: Payer: Self-pay | Admitting: Internal Medicine

## 2010-07-20 ENCOUNTER — Encounter: Payer: Self-pay | Admitting: Internal Medicine

## 2010-07-20 ENCOUNTER — Ambulatory Visit (INDEPENDENT_AMBULATORY_CARE_PROVIDER_SITE_OTHER): Payer: 59 | Admitting: Internal Medicine

## 2010-07-20 ENCOUNTER — Telehealth: Payer: Self-pay | Admitting: Internal Medicine

## 2010-07-20 VITALS — BP 112/82 | HR 88 | Temp 98.2°F | Ht 64.0 in | Wt 198.8 lb

## 2010-07-20 DIAGNOSIS — F419 Anxiety disorder, unspecified: Secondary | ICD-10-CM | POA: Insufficient documentation

## 2010-07-20 DIAGNOSIS — F411 Generalized anxiety disorder: Secondary | ICD-10-CM

## 2010-07-20 MED ORDER — SERTRALINE HCL 50 MG PO TABS: 50.0000 mg | ORAL_TABLET | Freq: Every day | ORAL | Status: DC

## 2010-07-20 MED ORDER — CLONAZEPAM 0.5 MG PO TABS: 0.5000 mg | ORAL_TABLET | Freq: Two times a day (BID) | ORAL | Status: DC | PRN

## 2010-07-20 NOTE — Progress Notes (Signed)
  Subjective:    Patient ID: Donna Robbins, female    DOB: August 23, 1970, 40 y.o.   MRN: [id number]  HPI Here for anxiety - increasing symptoms causing difficulty functioning at work/home Onset change symptoms 4 weeks ago, rapidly becoming worse Precipitated by stress at work and family (dad with cancer dx) and own medical fears ("scary skin spots" - eval by derm later today) Hx same - never on medications or counseling for same  Describes as inability to balance the irrational side by the rational side of her mind +cloudy and light head, tunnel feeling -  Denies SI/HI   reviewed other med issues: obesity - long hx same, worse since pregnancy and diet/cooking change complicated by limited time for exercise due to stress/job, etc planning diet changes and increased exercise efforts -  has never tried rx med for appetite suppression but would consider trying same +gest DM and FH DM, no OSA, OA - no thyroid hx   IBS symptoms - seeing GI for same - has not yet tried hycosamine, currently symptoms improved   Past Medical History  Diagnosis Date  . OBESITY   . HEARING LOSS   . Irritable bowel syndrome   . Allergy   . Anxiety      Review of Systems  Respiratory: Negative for shortness of breath.   Cardiovascular: Negative for chest pain.  Neurological: Positive for dizziness. Negative for syncope and headaches.  Psychiatric/Behavioral: Positive for dysphoric mood and decreased concentration. Negative for behavioral problems and agitation.       Objective:   Physical Exam  Constitutional: She is oriented to person, place, and time. She appears well-developed and well-nourished.       overweight  Cardiovascular: Normal rate, regular rhythm and normal heart sounds.   Pulmonary/Chest: Effort normal and breath sounds normal. No respiratory distress.  Neurological: She is alert and oriented to person, place, and time. No cranial nerve deficit.  Psychiatric: Judgment normal.     Flat mood and affect, but explains irrational fears and perseveration of thoughts   Lab Results  Component Value Date   WBC 10.1 05/24/2010   HGB 13.0 05/24/2010   HCT 38.7 05/24/2010   PLT 315 05/24/2010   CHOL 147 01/23/2010   HDL 41.0 01/23/2010   ALT 15 01/23/2010   AST 15 01/23/2010   NA 139 05/24/2010   K 3.7 05/24/2010   CL 104 05/24/2010   CREATININE 0.53 05/24/2010   BUN 6 05/24/2010   CO2 28 05/24/2010   TSH 1.15 05/24/2010        Assessment & Plan:  See problem list. Medications and labs reviewed today.

## 2010-07-20 NOTE — Patient Instructions (Signed)
It was good to see you today. Sertraline and clonazepam for anxiety symptoms - Your prescription(s) have been submitted to your pharmacy. Please take as directed and contact our office if you believe you are having problem(s) with the medication(s). Keep appt with EAP as discussed for counseling Please schedule followup in 6 weeks, call sooner if problems.

## 2010-07-20 NOTE — Assessment & Plan Note (Signed)
Hx same, never on meds -  exac by work and family stressors - Will start SSRI as well as prn BZ - explained indication and use and possible SE or each and pt understands + agrees to same Counseling also recommended - pt has appt with EAP for same FU 6 weeks, sooner if problems  Verified no SI/HI, pt will call if this changes

## 2010-07-20 NOTE — Telephone Encounter (Signed)
I spoke with the patient she had a dark stool this am.  She started on a new women's vitamin with iron last night.  She denies any complaints at this time.  Advised most likely from the iron she took.  She is asked to continue to monitor  And call us back if having continued problems.

## 2010-07-24 ENCOUNTER — Other Ambulatory Visit: Payer: Self-pay | Admitting: Dermatology

## 2010-07-26 ENCOUNTER — Telehealth: Payer: Self-pay

## 2010-07-26 NOTE — Telephone Encounter (Signed)
Pt called stating she started Zoloft Tuesday night qhs and she woke in the middle of the night with palpitations, and a burning sensation on one side of her face. Pt is concerned that she may be allergic to medication and is requesting advisement from MD.

## 2010-07-26 NOTE — Telephone Encounter (Signed)
prob not allergic if no rash, swelling, itching or sob  Ok to try again, as these are symptoms that are usually not assoc with side effect from the zoloft

## 2010-07-27 NOTE — Telephone Encounter (Signed)
Left message on machine for pt to return my call  

## 2010-07-30 NOTE — Telephone Encounter (Signed)
Pt advised and states that she stopped taking medication because it was causing nausea and extreme sleepiness and fatigue. Pt will make an appt to discuss other sxs and alternative medication. Appt scheduled

## 2010-08-01 ENCOUNTER — Ambulatory Visit (INDEPENDENT_AMBULATORY_CARE_PROVIDER_SITE_OTHER): Payer: 59 | Admitting: Internal Medicine

## 2010-08-01 ENCOUNTER — Encounter: Payer: Self-pay | Admitting: Internal Medicine

## 2010-08-01 VITALS — BP 112/68 | HR 87 | Temp 98.4°F | Ht 64.0 in

## 2010-08-01 DIAGNOSIS — F419 Anxiety disorder, unspecified: Secondary | ICD-10-CM

## 2010-08-01 DIAGNOSIS — F411 Generalized anxiety disorder: Secondary | ICD-10-CM

## 2010-08-01 MED ORDER — CITALOPRAM HYDROBROMIDE 10 MG PO TABS: 10.0000 mg | ORAL_TABLET | Freq: Two times a day (BID) | ORAL | Status: DC

## 2010-08-01 NOTE — Progress Notes (Signed)
  Subjective:    Patient ID: Donna Robbins, female    DOB: 08/28/1970, 40 y.o.   MRN: [id number]  HPI  Here for anxiety - increasing symptoms causing difficulty functioning at work/home Onset change symptoms 06/2010, rapidly becoming worse Precipitated by stress at work and family (dad with cancer dx) and own medical fears  Hx same - never on medications or counseling for same  Describes as inability to balance the irrational side with the rational side of her mind +cloudy and light head, tunnel feeling -  Denies SI/HI Started ssri (sertraline) 2 wks ago but did not tolerate SE (malaise/fatigue) - using BZ prn Fine tremor - worse with fatigue and end of day -  reviewed other med issues: obesity - long hx same, worse since pregnancy and diet/cooking change complicated by limited time for exercise due to stress/job, etc planning diet changes and increased exercise efforts -  has never tried rx med for appetite suppression but would consider trying same +gest DM and FH DM, no OSA, OA - no thyroid hx   IBS symptoms - seeing GI for same - has not yet tried hycosamine, currently symptoms improved   Past Medical History  Diagnosis Date  . OBESITY   . HEARING LOSS   . Irritable bowel syndrome   . Allergy   . Anxiety      Review of Systems  Respiratory: Negative for shortness of breath.   Cardiovascular: Negative for chest pain.  Neurological: Positive for dizziness. Negative for syncope and headaches.  Psychiatric/Behavioral: Positive for dysphoric mood and decreased concentration. Negative for behavioral problems and agitation.       Objective:   Physical Exam  Constitutional: She is oriented to person, place, and time. She appears well-developed and well-nourished.       overweight  Cardiovascular: Normal rate, regular rhythm and normal heart sounds.   Pulmonary/Chest: Effort normal and breath sounds normal. No respiratory distress.  Neurological: She is alert and  oriented to person, place, and time. No cranial nerve deficit.  Psychiatric: Judgment normal.       Flat mood and affect, but explains irrational fears and perseveration of thoughts   Lab Results  Component Value Date   WBC 10.1 05/24/2010   HGB 13.0 05/24/2010   HCT 38.7 05/24/2010   PLT 315 05/24/2010   CHOL 147 01/23/2010   HDL 41.0 01/23/2010   ALT 15 01/23/2010   AST 15 01/23/2010   NA 139 05/24/2010   K 3.7 05/24/2010   CL 104 05/24/2010   CREATININE 0.53 05/24/2010   BUN 6 05/24/2010   CO2 28 05/24/2010   TSH 1.15 05/24/2010        Assessment & Plan:  See problem list. Medications and labs reviewed today. Time spent with pt today 30 minutes, greater than 50% time spent counseling patient on anxiety and medication review. Also review of prior records including MRI and neuro studies (EMG/NCS)

## 2010-08-01 NOTE — Assessment & Plan Note (Signed)
Hx same, never on meds -  exac by work and family stressors - Started SSRI (sertraline) as well as prn BZ 06/2010 - intol of sertraline but willing to try another - citalopram erx done explained indication and use and possible SE - pt understands + agrees to same Counseling also recommended - pt following with EAP for same FU 6 weeks, sooner if problems  Verified no SI/HI, pt will call if this changes

## 2010-08-01 NOTE — Patient Instructions (Signed)
It was good to see you today. Let me know if we can complete your FMLA as discussed Start citalopram - Your prescription(s) have been submitted to your pharmacy. Please take as directed and contact our office if you believe you are having problem(s) with the medication(s). Please schedule followup in 6 weeks, call sooner if problems.

## 2010-08-07 ENCOUNTER — Telehealth: Payer: Self-pay | Admitting: Internal Medicine

## 2010-08-07 NOTE — Telephone Encounter (Signed)
Increasing heartburn, CP and night sweats.  Zantac is not helping.  She will come in and see Amy Esterwood PA in the am at 8:30

## 2010-08-08 ENCOUNTER — Encounter: Payer: Self-pay | Admitting: Physician Assistant

## 2010-08-08 ENCOUNTER — Ambulatory Visit (INDEPENDENT_AMBULATORY_CARE_PROVIDER_SITE_OTHER): Payer: 59 | Admitting: Physician Assistant

## 2010-08-08 VITALS — BP 104/72 | HR 64 | Ht 64.0 in | Wt 196.8 lb

## 2010-08-08 DIAGNOSIS — K219 Gastro-esophageal reflux disease without esophagitis: Secondary | ICD-10-CM | POA: Insufficient documentation

## 2010-08-08 DIAGNOSIS — Z0279 Encounter for issue of other medical certificate: Secondary | ICD-10-CM

## 2010-08-08 MED ORDER — ESOMEPRAZOLE MAGNESIUM 40 MG PO CPDR: DELAYED_RELEASE_CAPSULE | ORAL | Status: DC

## 2010-08-08 NOTE — Patient Instructions (Signed)
We have given you samples of Nexium 40 Mg. Take 1 cap 30 min before dinner. Let us know how the samples are working. If they help we can call in a prescription for you. We have given you a Reflux diet and a Low Gas Diet, brochures. Take the Nexium instead of the Xantac.

## 2010-08-08 NOTE — Progress Notes (Signed)
  Subjective:    Patient ID: Donna Robbins, female    DOB: October 18, 1970, 40 y.o.   MRN: [id number]  HPI Donna Robbins is a pleasant 40 year old female known to Dr. Leone Payor who has been seen in the past for IBS symptoms. She is status post remote cholecystectomy. She comes in today with new complaint of heartburn type symptoms over the past couple of months. She says her symptoms have not been progressive better not really improving either. She has placed herself on Zantac 150 twice daily over the past month and thinks that this has been somewhat helpful. She describes frequent upper abdominal discomfort under her ribs bilaterally which seems to be exacerbated by eating and is bandlike with some radiation around into her back. She says this is generally worse during the day particularly he aggravated by salad,spicy foods etc. She has been watching her diet in trying to eat smaller more frequent meals. Now over the past couple of months she has developed frequent note night sweats which she feels is related to the onset of menopause. Along with this has noted  Evening. and nighttime heartburn. Again this seems to be aggravated by eating acidic foods or eating late. She is not aware of having nocturnal reflux. She has no complaints of dysphagia or odynophagia. She says she just gets discomfort in her lower chest and fullness.  She has had no nausea or vomiting. She does take Aleve periodically, though not daily.  She is being treated for anxiety per Dr. Felicity Coyer  and has been taking Celexa and Klonopin, and wonders if these may be contributing to some of her new GI symptoms as well. She does feel that the anti anxiety meds have helped her IBS.    Review of Systems  Constitutional: Negative.   HENT: Negative.   Eyes: Negative.   Respiratory: Negative.   Cardiovascular: Negative.   Genitourinary: Negative.   Musculoskeletal: Positive for back pain.  Skin: Negative.   Neurological: Negative.     Hematological: Negative.   Psychiatric/Behavioral: The patient is nervous/anxious.        Objective:   Physical Exam Well-developed white female in no acute distress, pleasant, alert and oriented x3  HEENT;   normocephalic EOMI PERRLA sclera anicteric Neck; Supple no JVD , Cardiovascular; regular rate and rhythm with S1-S2 no murmur or gallop, Pulmonary;  clear bilaterally, Abdomen; soft; nontende;r nondistended bowel sounds active no palpable mass or hepatosplenomegaly, Rectal; not done, Skin; warm and dry benign, Psych; mood and affect normal and appropriate.        Assessment & Plan:  #89 41-year-old female with 2 month history of evening and nighttime heartburn consistent with GERD. No worrisome features.  Plan; Have discussed antireflux regimen, and antireflux diet, in addition she will reviewed a low gas diet. Trial of Nexium 40 mg one half hour before dinner, she was given samples today and if this is helpful will provide a prescription. This will be in place of the Zantac 150 twice daily Discussed minimizing NSAIDs, as much as possible. Followup with Dr. Leone Payor on a when necessary basis.  #2 IBS  #3 Anxiety.

## 2010-08-08 NOTE — Progress Notes (Signed)
Agree with the assessment and plans for this 40 year old

## 2010-08-10 NOTE — Discharge Summary (Signed)
Donna Robbins, Donna Robbins                      ACCOUNT NO.:  [id number]   MEDICAL RECORD NO.:  [id number]                   PATIENT TYPE:  INP   LOCATION:  9144                                 FACILITY:  [facility]   PHYSICIAN:  Lenoard Aden, M.D.             DATE OF BIRTH:  1970/07/02   DATE OF ADMISSION:  08/29/2003  DATE OF DISCHARGE:  09/01/2003                                 DISCHARGE SUMMARY   REASON FOR ADMISSION:  Labor.   PERTINENT FINDINGS:  Patient underwent uncomplicated primary C-section on  September 28, 2003 for arrested labor, tolerate regular diet well.  Postoperatively, hemoglobin stable, ambulated without difficulty, tolerated  regular diet, as noted.  Discharged to home postop day #3.   DISCHARGE MEDICATIONS:  Prenatal vitamins, Percocet, Tylenol given upon  discharge.   FOLLOWUP:  Follow up in the office in 1 week for incision check.   CONDITION ON DISCHARGE:  Good.   COMPLICATIONS:  No complications.   DISCHARGE INSTRUCTIONS:  Discharge teaching done.   DISCHARGE DIAGNOSIS:  Arrested labor, for primary cesarean section.                                               Lenoard Aden, M.D.    RJT/MEDQ  D:  10/09/2003  T:  10/10/2003  Job:  [id number]

## 2010-08-10 NOTE — H&P (Signed)
Donna Robbins, Donna Robbins                      ACCOUNT NO.:  [id number]   MEDICAL RECORD NO.:  [id number]                   PATIENT TYPE:  INP   LOCATION:  9163                                 FACILITY:  [facility]   PHYSICIAN:  Lenoard Aden, M.D.             DATE OF BIRTH:  [birth year]   DATE OF ADMISSION:  08/29/2003  DATE OF DISCHARGE:                                HISTORY & PHYSICAL   CHIEF COMPLAINT:  Spontaneous rupture of membranes with thick meconium on  August 29, 2003, at 0400.   HISTORY OF PRESENT ILLNESS:  The patient is a 40 year old white female, G2,  P0, EDD August 29, 2003, who presents with spontaneous rupture of membranes.   She has allergies to SULFA.  She also has allergies to BETADINE.   Medications are prenatal vitamins.   She has a history of a spontaneous miscarriage in November 2003, history of  abnormal Pap smear with normal follow-up, history of borderline anemia,  history of varicose veins, tonsillectomy, and ENT surgery for sleep apnea.   She has a family history of heart disease, lung cancer, adult-onset  diabetes, and Gilbert's syndrome.   SOCIAL HISTORY:  Noncontributory.   PRENATAL LABORATORY DATA:  Blood type O positive.  Rubella immune.  Hepatitis negative.  GBS is positive.  Triple screen was normal.  One-hour  glucose test was normal.   PHYSICAL EXAMINATION:  GENERAL:  The patient is a well-developed, well-  nourished white female in no acute distress.  HEENT:  Normal.  CHEST:  Lungs clear.  CARDIAC:  Regular rate and rhythm.  ABDOMEN:  Soft, gravid, and nontender.  Estimated fetal weight of 9 pounds.  PELVIC:  Cervix is 2 cm, 70%, vertex, -2 to -3.  EXTREMITIES:  No cords.  NEUROLOGIC:  Nonfocal.   IMPRESSION:  1. A 40-week intrauterine pregnancy.  2. Meconium with spontaneous rupture of membranes.  3. Presumed large for gestational age.  4. Group B strep positive.   PLAN:  1. Admit.  2. Amnioinfusion.  3. Group B strep prophylaxis.  4. Anticipate guarded attempts at vaginal delivery.  Watch fetal heart tones     closely.                                               Lenoard Aden, M.D.    RJT/MEDQ  D:  08/29/2003  T:  08/29/2003  Job:  [id number]

## 2010-08-10 NOTE — Op Note (Signed)
Donna Robbins, Donna Robbins          ACCOUNT NO.:  [id number]   MEDICAL RECORD NO.:  [id number]          PATIENT TYPE:  AMB   LOCATION:  SDS                          FACILITY:  [facility]   PHYSICIAN:  Gabrielle Dare. Janee Morn, M.D.DATE OF BIRTH:  1970/11/25   DATE OF PROCEDURE:  04/08/2006  DATE OF DISCHARGE:                               OPERATIVE REPORT   PREOPERATIVE DIAGNOSIS:  Symptomatic cholelithiasis.   POSTOPERATIVE DIAGNOSIS:  Symptomatic cholelithiasis.   PROCEDURE:  Laparoscopic cholecystectomy with intraoperative  cholangiogram.   SURGEON:  Gabrielle Dare. Janee Morn, M.D.   ASSISTANT:  Ardeth Sportsman, MD.   ANESTHESIA:  General.   HISTORY OF PRESENT ILLNESS:  The patient is a 40 year old white female,  whom I evaluated in the office for episodic right upper quadrant pain.  Workup included ultrasound, which demonstrated multiple gallstones, and  she presents today for elective cholecystectomy.   PROCEDURE IN DETAIL:  Informed consent was obtained.  The patient was  identified and she received intravenous antibiotics.  She was brought to  the operating room.  General anesthesia was administered by the  anesthesia staff.  Her abdomen was prepped and draped in sterile  fashion.  No Betadine-containing products were used throughout the case  due to her allergy.  The infraumbilical area was infiltrated with 0.25%  Marcaine with epinephrine.  An infraumbilical skin incision was made.  Subcutaneous tissues were dissected down, revealing the anterior fascia.  This was divided sharply and the peritoneal cavity was entered under  direct vision without difficulty.  A 0 Vicryl pursestring suture was  placed around the fascial opening.  The Hasson trocar was inserted into  the abdomen and the abdomen was insufflated with carbon dioxide in the  standard fashion.  Under direct vision, an 11-mm epigastric and two 5-mm  lateral ports were placed.  Marcaine 0.25% with epinephrine was used at  all  port sites.  Laparoscopic exploration revealed some adhesions to the  gallbladder, especially around the infundibulum.  The dome of the  gallbladder was retracted superomedially, and the infundibulum was  retracted inferolaterally.  These filmy omental adhesions were gradually  swept away.  Some involved the area near the duodenum and some involved  the omentum.  The duodenum was protected throughout.  These adhesions  swept away easily.  Dissection then began laterally and progressed  medially, easily identifying the cystic duct.  This was  circumferentially dissected.  Further dissection also revealed the  cystic artery.  Dissection continued until a large window was made  between the cystic duct, the infundibulum and the liver.  Once this was  accomplished, with excellent visualization, a clip was placed on the  infundibulocystic duct junction.  A small nick was made in the cystic  duct and a Reddick cholangiogram catheter was inserted.  Intraoperative  cholangiogram was obtained.  This demonstrated no filling defects in the  common bile duct and good flow of contrast into the duodenum.  There was  decent length of cystic duct.  The cholangiogram was completed and the  catheter was removed.  Three clips were placed proximally on the cystic  duct and it  was divided.  Two clips were placed proximally on the cystic  artery, and 1 clip was placed distally, and it was divided as well.  The  gallbladder was taken off the liver bed with the Bovie cautery.  During  this dissection, a small posterior branch of the cystic artery was  encountered.  This was clipped once proximally and divided distally with  cautery.  Gallbladder was taken off the liver bed and placed in an  EndoCatch bag.  Cautery was used to get excellent hemostasis.  The clips  were inspected and remained in good position.  The abdomen was copiously  irrigated.  We used almost 2 L of saline until the irrigation fluid  returned  clear.  Hemostasis was insured. The liver bed was dry.  The  clips remained in good position.  The gallbladder inside the EndoCatch  bag was removed from the abdomen via the infraumbilical port site.  The  infraumbilical fascia was then closed under direct vision by tying the 0  Vicryl pursestring and getting excellent closure.  The remainder of the  irrigation fluid was evacuated and it was clear.  The liver bed was  rechecked and it was dry.  The ports were then removed under direct  vision.  The pneumoperitoneum was released.  The incisions were  copiously irrigated.  Some additional local anesthetic was injected, and  the skin of each was closed with a running 4-0 Vicryl subcuticular  stitch.  Sponge, needle and instrument counts were all correct.  Benzoin, Steri-Strips and sterile dressings were applied.  The patient  tolerated the procedure well and without apparent complication and was  taken to the recovery room in stable condition.      Gabrielle Dare Janee Morn, M.D.  Electronically Signed     BET/MEDQ  D:  04/08/2006  T:  04/08/2006  Job:  [id number]   cc:   Iva Boop, MD,FACG  Maxie Better, M.D.  Ardeth Sportsman, MD

## 2010-08-10 NOTE — Assessment & Plan Note (Signed)
[facility]                           GASTROENTEROLOGY OFFICE NOTE   Donna Robbins, Donna Robbins                   MRN:          [id number]  DATE:01/31/2006                            DOB:          05-Jun-1970    CHIEF COMPLAINT:  Epigastric pain, question gallbladder.   HISTORY:  A 40 year old white woman, site Production designer, theatre/television/film at [employer], who  has had a couple of spells of epigastric pain and upper abdominal pain  radiating to the back somewhat recently. On November 2 for about 5 minutes,  she had that and on November 8 for about 20 minutes. Fairly intense pain  particularly the one yesterday and still has some mild heartburn or  discomfort in the epigastric area at this time. She tells me that it is very  similar to what her mom suffered with before her gallbladder was removed.  She had eaten fatty spicy foods prior to this. She had not had chronic  recurrent heartburn or abdominal problems. Sometimes she has irritable bowel  problems with occasional over-the-counter medications or diet control. She  has not had fever or jaundice.   MEDICATIONS:  Birth control pills.   DRUG ALLERGIES:  SULFA.   PAST MEDICAL HISTORY:  Childbirth x1, allergies. ENT surgery 2004, cesarean  section 2005.   FAMILY HISTORY:  Mother has diabetes and gallbladder problems. No colon  cancer.   SOCIAL HISTORY:  Married, one son, rare alcohol, no tobacco or drugs.   REVIEW OF SYSTEMS:  See medical history form.   PHYSICAL EXAMINATION:  GENERAL:  Reveals a well-developed, obese, white  woman in no acute distress.  VITAL SIGNS:  Weight 222 pounds, blood pressure 108/68, pulse 88.  HEENT:  Eyes anicteric.  NECK:  Supple.  CHEST:  Clear.  HEART:  S1, S2, no murmurs, rubs or gallops.  ABDOMEN:  Soft, nontender, no organomegaly or mass.  EXTREMITIES:  No edema.  SKIN:  No rash.  NEUROLOGIC:  She is alert and oriented x3.   ASSESSMENT:  Episodic epigastric and other  upper abdominal pain. Gallstones  certainly could cause this.   PLAN:  Check LFTs, amylase, lipase, ultrasound. If she has gallstones, a  surgical consult would be appropriate. If she does not, a HIDA scan with  ejection fraction would be reasonable. I have  given her samples of Protonix to take twice daily over the weekend as  adjunctive therapy and though I do not really think she has an ulcer or  reflux problems, I do not think that is going to hurt her.     Iva Boop, MD,FACG  Electronically Signed    CEG/MedQ  DD: 01/31/2006  DT: 02/01/2006  Job #: [id number]

## 2010-08-27 ENCOUNTER — Other Ambulatory Visit: Payer: Self-pay | Admitting: Internal Medicine

## 2010-08-27 MED ORDER — ESOMEPRAZOLE MAGNESIUM 40 MG PO CPDR: DELAYED_RELEASE_CAPSULE | ORAL | Status: DC

## 2010-08-27 NOTE — Telephone Encounter (Signed)
Rx faxed to [facility] outpatient pharmact @ [phone] per VO Dr. Felicity Coyer

## 2010-08-27 NOTE — Telephone Encounter (Signed)
Refilled Nexium 40 mg #30 with 11

## 2010-08-27 NOTE — Telephone Encounter (Signed)
Request for Clonazepam 0.5mg  [last filled/print 07/20/2010 #30x0]

## 2010-08-29 ENCOUNTER — Ambulatory Visit (INDEPENDENT_AMBULATORY_CARE_PROVIDER_SITE_OTHER): Payer: 59 | Admitting: Family Medicine

## 2010-08-29 ENCOUNTER — Encounter: Payer: Self-pay | Admitting: Family Medicine

## 2010-08-29 DIAGNOSIS — M79609 Pain in unspecified limb: Secondary | ICD-10-CM

## 2010-08-29 DIAGNOSIS — M79606 Pain in leg, unspecified: Secondary | ICD-10-CM | POA: Insufficient documentation

## 2010-08-29 DIAGNOSIS — R269 Unspecified abnormalities of gait and mobility: Secondary | ICD-10-CM | POA: Insufficient documentation

## 2010-08-29 NOTE — Progress Notes (Signed)
  Subjective:    Patient ID: Donna Robbins, female    DOB: 1970-05-01, 40 y.o.   MRN: [id number]  HPI 40 year old female referred by Dr. Margaretha Sheffield here for gait evaluation and possible orthotics. She has had a history of chronic intermittent right knee pain anteriorly and laterally on and off for several months to years. She was diagnosed with patellofemoral pain syndrome by Dr. Margaretha Sheffield and was told to do quad strengthening. She tells me today that she's also had intermittent lateral hip thigh and lower leg pain as well extending into her ankle. She admits that she does walk on the sides of her feet, but overall her feet don't bother her too bad. She does admit though that she wears out the sides her shoes faster on the outside than the inside. She is not a big runner. She now wears Dansk[employer] to work. She thinks she could wear tennis shoes if she needed to.  Past medical history: Positive for year-old bowel syndrome, acid reflux, anxiety, depression Past surgical history: Positive for cholecystectomy and history of C-sections Social history: No smoking or drinking. She is married. 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 appearance: Overweight female in no distress Psych: Normal affect Neuro: Alert and oriented Neck: Supple ENT: Moist because membranes Lungs: No labored breathing Abdomen: Soft Hips: Full range of motion without pain. Significant weakness with hip abduction on the right side, and mild weakness on left hip abduction. Only mild tightness with Ober's test bilaterally. Knees: Full range of motion bilaterally. No effusion. Right knee with mild crepitus. Ligaments are intact. McMurray's is negative. No  joint line tenderness. Does have an increased Q angle bilaterally. Feet: Fairly neutral feet. Minimal midfoot breakdown standing still. When walking, she actually supinates a fair amount.       Assessment & Plan:  Multiple areas of muscular complaints including hip pain knee pain thigh pain and ankle pain in this female who does supinate somewhat when she walks. - Discussed possible custom orthotic, but I think in her I would prefer to try the temporary inserts first. Thus today we gave her women's size 6-7 sports inserts with some lateral fifth ray post to help with supination. -I also gave her hip strengthening, IT band stretches, and quad strengthening exercises to do as well. -Plan a followup in 3-4 weeks to see how she's doing, at that point we can consider custom orthotics if she really wants them.

## 2010-08-31 ENCOUNTER — Ambulatory Visit: Payer: 59 | Admitting: Internal Medicine

## 2010-09-21 ENCOUNTER — Encounter: Payer: Self-pay | Admitting: Internal Medicine

## 2010-09-21 ENCOUNTER — Ambulatory Visit (INDEPENDENT_AMBULATORY_CARE_PROVIDER_SITE_OTHER): Payer: 59 | Admitting: Internal Medicine

## 2010-09-21 DIAGNOSIS — K219 Gastro-esophageal reflux disease without esophagitis: Secondary | ICD-10-CM

## 2010-09-21 DIAGNOSIS — F411 Generalized anxiety disorder: Secondary | ICD-10-CM

## 2010-09-21 DIAGNOSIS — F419 Anxiety disorder, unspecified: Secondary | ICD-10-CM

## 2010-09-21 NOTE — Progress Notes (Signed)
  Subjective:    Patient ID: Donna Robbins, female    DOB: 08/24/1970, 40 y.o.   MRN: [id number]  HPI  Here for follow up anxiety -  increasing symptoms causing difficulty functioning at work/home Onset change symptoms 06/2010, rapidly becoming worse Precipitated by stress at work and family (dad with cancer dx - now on chemo)  Hx same - but never on medications or counseling until late 06/2010 Prior inability to balance the irrational side with the rational side of her mind; +cloudy and light head, tunnel feeling -  All now improved Denies SI/HI Started ssri (sertraline)  06/2010 ago but did not tolerate SE (malaise/fatigue) -  Changed to citalopram 07/25/10 - doing well -using BZ prn associated with fine tremor, worse with fatigue and end of day - improving  Also reviewed other med issues:  GERD - improved on Nexium  obesity - long hx same, worse since pregnancy and diet/cooking change complicated by limited time for exercise due to stress/job, etc planning diet changes and increased exercise efforts -  has never tried rx med for appetite suppression but would consider trying same +gest DM and FH DM, no OSA, OA - no thyroid hx   IBS symptoms - seeing GI for same - has not yet tried hycosamine, currently symptoms improved with tx of GERD/PPI   Past Medical History  Diagnosis Date  . OBESITY   . HEARING LOSS   . Irritable bowel syndrome   . Allergy   . Anxiety      Review of Systems  Respiratory: Negative for shortness of breath.   Cardiovascular: Negative for chest pain.  Neurological: Negative for dizziness, syncope and headaches.  Psychiatric/Behavioral: Negative for behavioral problems, dysphoric mood, decreased concentration and agitation.       Objective:   Physical Exam  Constitutional: She is oriented to person, place, and time. She appears well-developed and well-nourished.       overweight  Cardiovascular: Normal rate, regular rhythm and normal heart  sounds.   Pulmonary/Chest: Effort normal and breath sounds normal. No respiratory distress.  Neurological: She is alert and oriented to person, place, and time. No cranial nerve deficit.  Psychiatric: She has a normal mood and affect. Her behavior is normal. Judgment and thought content normal.       brighter mood and affect than 07/2010 and 06/2010 OVs   Lab Results  Component Value Date   WBC 10.1 05/24/2010   HGB 13.0 05/24/2010   HCT 38.7 05/24/2010   PLT 315 05/24/2010   CHOL 147 01/23/2010   HDL 41.0 01/23/2010   ALT 15 01/23/2010   AST 15 01/23/2010   NA 139 05/24/2010   K 3.7 05/24/2010   CL 104 05/24/2010   CREATININE 0.53 05/24/2010   BUN 6 05/24/2010   CO2 28 05/24/2010   TSH 1.15 05/24/2010        Assessment & Plan:  See problem list. Medications and labs reviewed today. Time spent with pt today 25 minutes, greater than 50% time spent counseling patient on anxiety and medication review. Also review of prior records

## 2010-09-21 NOTE — Assessment & Plan Note (Signed)
Doing well on nexium - continue same

## 2010-09-21 NOTE — Patient Instructions (Signed)
It was good to see you today. Continue citalopram - Medications reviewed, no changes at this time. Please schedule followup in 3-4 months, call sooner if problems.

## 2010-09-21 NOTE — Assessment & Plan Note (Signed)
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

## 2010-10-18 ENCOUNTER — Encounter: Payer: Self-pay | Admitting: Internal Medicine

## 2010-11-16 ENCOUNTER — Other Ambulatory Visit: Payer: Self-pay

## 2010-11-16 ENCOUNTER — Other Ambulatory Visit: Payer: Self-pay | Admitting: Internal Medicine

## 2010-11-16 NOTE — Telephone Encounter (Signed)
A user error has taken place: encounter opened in error, closed for administrative reasons.

## 2010-12-14 LAB — POCT URINALYSIS DIP (DEVICE)
Bilirubin Urine: NEGATIVE
Hgb urine dipstick: NEGATIVE
Ketones, ur: NEGATIVE
Protein, ur: NEGATIVE
Specific Gravity, Urine: 1.015
pH: 7

## 2011-01-25 ENCOUNTER — Ambulatory Visit (INDEPENDENT_AMBULATORY_CARE_PROVIDER_SITE_OTHER): Payer: 59 | Admitting: Internal Medicine

## 2011-01-25 ENCOUNTER — Encounter: Payer: Self-pay | Admitting: Internal Medicine

## 2011-01-25 VITALS — BP 110/82 | HR 74 | Temp 98.2°F | Ht 64.0 in | Wt 220.4 lb

## 2011-01-25 DIAGNOSIS — F411 Generalized anxiety disorder: Secondary | ICD-10-CM

## 2011-01-25 DIAGNOSIS — E669 Obesity, unspecified: Secondary | ICD-10-CM

## 2011-01-25 DIAGNOSIS — F419 Anxiety disorder, unspecified: Secondary | ICD-10-CM

## 2011-01-25 MED ORDER — CLONAZEPAM 0.5 MG PO TABS: 0.5000 mg | ORAL_TABLET | Freq: Two times a day (BID) | ORAL | Status: DC | PRN

## 2011-01-25 MED ORDER — CITALOPRAM HYDROBROMIDE 10 MG PO TABS: 10.0000 mg | ORAL_TABLET | Freq: Two times a day (BID) | ORAL | Status: DC

## 2011-01-25 NOTE — Assessment & Plan Note (Signed)
Weight gain reviewed Wt Readings from Last 3 Encounters:  01/25/11 220 lb 6.4 oz (99.973 kg)  09/21/10 201 lb 12.8 oz (91.536 kg)  08/08/10 196 lb 12.8 oz (89.268 kg)   Planning regular gym activity at [employer] and attention to stress eating Could consider prescription appetite suppression, but do not wish to exacerbate anxiety tendencies

## 2011-01-25 NOTE — Assessment & Plan Note (Signed)
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

## 2011-01-25 NOTE — Patient Instructions (Signed)
It was good to see you today. Continue citalopram and klonopin as needed - Refill on medication(s) as discussed today. Medications reviewed, no changes at this time. Please schedule followup in 3-4 months to review anxiety and medications, call sooner if problems.

## 2011-01-25 NOTE — Progress Notes (Signed)
  Subjective:    Patient ID: Donna Robbins, female    DOB: 1971-02-03, 40 y.o.   MRN: [id number]  HPI  Here for follow up - reviewed chronic medical issues:  anxiety -  Onset current symptoms 06/2010  precipitated by stress (work, loss of job 6/12 and dad with cancer dx spring 2012)  Hx same - but never on medications or counseling until late 06/2010 Describes as "inability to balance the irrational side with the rational side of the mind" +cloudy and light head, tunnel feeling -  Also associated with physical exhaustion - sleeps 8-10h/night, and still hard to get out of bed in AM Denies SI/HI - Started ssri (sertraline)  06/2010 ago but did not tolerate SE (increase fatigue) -  Changed to citalopram 07/25/10 - doing well -using BZ prn Still associated with fine tremor, worse with fatigue and end of day -  GERD - improved on Nexium  obesity - long hx same, worse since pregnancy and diet/cooking change complicated by limited time for exercise due to stress/job, etc planning diet changes and increased exercise efforts -  has never tried rx med for appetite suppression but would consider trying same +gest DM and FH DM, no OSA, OA - no thyroid hx   IBS symptoms - seeing GI for same - has not yet tried hycosamine, currently symptoms improved with tx of GERD/PPI   Past Medical History  Diagnosis Date  . OBESITY   . HEARING LOSS   . Irritable bowel syndrome   . Allergy   . Anxiety      Review of Systems  Respiratory: Negative for shortness of breath.   Cardiovascular: Negative for chest pain.  Neurological: Negative for dizziness, syncope and headaches.  Psychiatric/Behavioral: Negative for behavioral problems, dysphoric mood, decreased concentration and agitation.       Objective:   Physical Exam Wt Readings from Last 3 Encounters:  01/25/11 220 lb 6.4 oz (99.973 kg)  09/21/10 201 lb 12.8 oz (91.536 kg)  08/08/10 196 lb 12.8 oz (89.268 kg)   BP 110/82  Pulse 74   Temp(Src) 98.2 F (36.8 C) (Oral)  Ht 5\' 4"  (1.626 m)  Wt 220 lb 6.4 oz (99.973 kg)  BMI 37.83 kg/m2  SpO2 99%  Constitutional: She is overweight; appears well-developed and well-nourished. No distress.  Cardiovascular: Normal rate, regular rhythm and normal heart sounds.  No murmur heard. No BLE edema. Pulmonary/Chest: Effort normal and breath sounds normal. No respiratory distress. She has no wheezes. Psychiatric: She has a normal mood and affect. Her behavior is normal. Judgment and thought content normal.    Lab Results  Component Value Date   WBC 10.1 05/24/2010   HGB 13.0 05/24/2010   HCT 38.7 05/24/2010   PLT 315 05/24/2010   CHOL 147 01/23/2010   HDL 41.0 01/23/2010   ALT 15 01/23/2010   AST 15 01/23/2010   NA 139 05/24/2010   K 3.7 05/24/2010   CL 104 05/24/2010   CREATININE 0.53 05/24/2010   BUN 6 05/24/2010   CO2 28 05/24/2010   TSH 1.15 05/24/2010        Assessment & Plan:  See problem list. Medications and labs reviewed today. Time spent with pt today 25 minutes, greater than 50% time spent counseling patient on anxiety and medication review. Also review of prior records

## 2011-05-08 ENCOUNTER — Encounter: Payer: Self-pay | Admitting: Internal Medicine

## 2011-07-13 ENCOUNTER — Encounter: Payer: Self-pay | Admitting: Family Medicine

## 2011-07-13 ENCOUNTER — Ambulatory Visit (INDEPENDENT_AMBULATORY_CARE_PROVIDER_SITE_OTHER): Payer: 59 | Admitting: Family Medicine

## 2011-07-13 VITALS — BP 102/68 | Temp 98.2°F | Wt 235.0 lb

## 2011-07-13 DIAGNOSIS — R109 Unspecified abdominal pain: Secondary | ICD-10-CM

## 2011-07-13 LAB — POCT URINALYSIS DIPSTICK
Leukocytes, UA: NEGATIVE
Protein, UA: NEGATIVE
Spec Grav, UA: <=1.005
Urobilinogen, UA: 0.2
pH, UA: 5

## 2011-07-13 MED ORDER — CYCLOBENZAPRINE HCL 10 MG PO TABS: 10.0000 mg | ORAL_TABLET | Freq: Every evening | ORAL | Status: AC | PRN

## 2011-07-13 NOTE — Patient Instructions (Signed)
Back Pain, Adult Low back pain is very common. About 1 in 5 people have back pain.The cause of low back pain is rarely dangerous. The pain often gets better over time.About half of people with a sudden onset of back pain feel better in just 2 weeks. About 8 in 10 people feel better by 6 weeks.  CAUSES Some common causes of back pain include:  Strain of the muscles or ligaments supporting the spine.   Wear and tear (degeneration) of the spinal discs.   Arthritis.   Direct injury to the back.  DIAGNOSIS Most of the time, the direct cause of low back pain is not known.However, back pain can be treated effectively even when the exact cause of the pain is unknown.Answering your caregiver's questions about your overall health and symptoms is one of the most accurate ways to make sure the cause of your pain is not dangerous. If your caregiver needs more information, he or she may order lab work or imaging tests (X-rays or MRIs).However, even if imaging tests show changes in your back, this usually does not require surgery. HOME CARE INSTRUCTIONS For many people, back pain returns.Since low back pain is rarely dangerous, it is often a condition that people can learn to manageon their own.   Remain active. It is stressful on the back to sit or stand in one place. Do not sit, drive, or stand in one place for more than 30 minutes at a time. Take short walks on level surfaces as soon as pain allows.Try to increase the length of time you walk each day.   Do not stay in bed.Resting more than 1 or 2 days can delay your recovery.   Do not avoid exercise or work.Your body is made to move.It is not dangerous to be active, even though your back may hurt.Your back will likely heal faster if you return to being active before your pain is gone.   Pay attention to your body when you bend and lift. Many people have less discomfortwhen lifting if they bend their knees, keep the load close to their  bodies,and avoid twisting. Often, the most comfortable positions are those that put less stress on your recovering back.   Find a comfortable position to sleep. Use a firm mattress and lie on your side with your knees slightly bent. If you lie on your back, put a pillow under your knees.   Only take over-the-counter or prescription medicines as directed by your caregiver. Over-the-counter medicines to reduce pain and inflammation are often the most helpful.Your caregiver may prescribe muscle relaxant drugs.These medicines help dull your pain so you can more quickly return to your normal activities and healthy exercise.   Put ice on the injured area.   Put ice in a plastic bag.   Place a towel between your skin and the bag.   Leave the ice on for 15 to 20 minutes, 3 to 4 times a day for the first 2 to 3 days. After that, ice and heat may be alternated to reduce pain and spasms.   Ask your caregiver about trying back exercises and gentle massage. This may be of some benefit.   Avoid feeling anxious or stressed.Stress increases muscle tension and can worsen back pain.It is important to recognize when you are anxious or stressed and learn ways to manage it.Exercise is a great option.  SEEK MEDICAL CARE IF:  You have pain that is not relieved with rest or medicine.   You have   pain that does not improve in 1 week.   You have new symptoms.   You are generally not feeling well.  SEEK IMMEDIATE MEDICAL CARE IF:   You have pain that radiates from your back into your legs.   You develop new bowel or bladder control problems.   You have unusual weakness or numbness in your arms or legs.   You develop nausea or vomiting.   You develop abdominal pain.   You feel faint.  Document Released: 03/11/2005 Document Revised: 02/28/2011 Document Reviewed: 07/30/2010 [employer] Patient Information 2012 [employer]. 

## 2011-07-13 NOTE — Progress Notes (Signed)
Subjective:    Patient ID: Donna Robbins, female    DOB: [birth year], 41 y.o.   MRN: [id number]  HPI Abdominal pain - Having right flank pain. Very tender to put right where her ribs end.  Worse with twisting.  Not aggrevating by riding in the car.  Pain is intermittant, started 3 days ago. No known trauma but does lift her 41 year old. No dysuria  Or hematuria.  No fever.  No nausea or vomiting.  Normal BMS.  No blood in the stool. No known trauma to the ribs. No SOB. Pain is mild. Aleve does help.    Review of Systems BP 102/68  Temp(Src) 98.2 F (36.8 C) (Oral)  Wt 235 lb (106.595 kg)    Allergies  Allergen Reactions  . Iodine     REACTION: Itching  . Sulfonamide Derivatives     REACTION: Itching Rash    Past Medical History  Diagnosis Date  . OBESITY   . HEARING LOSS   . Irritable bowel syndrome   . Allergy   . Anxiety     Past Surgical History  Procedure Date  . Cesarean section     x's 2  . Ent surgery 2004  . Cholecystectomy     History   Social History  . Marital Status: Married    Spouse Name: N/A    Number of Children: 2  . Years of Education: N/A   Occupational History  . CLINIC SITE MANAGER    Social History Main Topics  . Smoking status: Never Smoker   . Smokeless tobacco: Never Used  . Alcohol Use: Yes     Rarely  . Drug Use: No  . Sexually Active: Not on file   Other Topics Concern  . Not on file   Social History Narrative  . No narrative on file    Family History  Problem Relation Age of Onset  . Arthritis Maternal Grandmother     grandfather  . Diabetes Mother     parent  . Hyperlipidemia Other     parents  . Lung cancer Maternal Grandmother     Grandfather  . Arthritis      Both parents  . Cancer Father     Angio sarcoma    Outpatient Encounter Prescriptions as of 07/13/2011  Medication Sig Dispense Refill  . acetaminophen (TYLENOL) 325 MG tablet Take 650 mg by mouth every 6 (six) hours as needed.        .  calcium carbonate (TUMS - DOSED IN MG ELEMENTAL CALCIUM) 500 MG chewable tablet Chew 2 tablets by mouth daily.       . Calcium Carbonate-Vitamin D (CALTRATE 600+D) 600-400 MG-UNIT per tablet Take 1 tablet by mouth daily.        . citalopram (CELEXA) 10 MG tablet Take 1 tablet (10 mg total) by mouth 2 (two) times daily.  60 tablet  3  . clonazePAM (KLONOPIN) 0.5 MG tablet Take 1 tablet (0.5 mg total) by mouth 2 (two) times daily as needed for anxiety.  30 tablet  1  . esomeprazole (NEXIUM) 40 MG capsule Take 30 min before dinner.  30 capsule  11  . Magnesium Chloride-Calcium (SLOW MAGNESIUM/CALCIUM PO) Take by mouth daily.        . Multiple Vitamins-Minerals (WOMENS MULTIVITAMIN PLUS PO) Take by mouth daily.        . naproxen sodium (ANAPROX) 220 MG tablet Take 220 mg by mouth as needed.       Marland Kitchen  pseudoephedrine (SUDAFED) 30 MG tablet Take 30 mg by mouth as needed.        . simethicone (GAS-X) 80 MG chewable tablet Chew 80 mg by mouth every 6 (six) hours as needed.        . cyclobenzaprine (FLEXERIL) 10 MG tablet Take 1 tablet (10 mg total) by mouth at bedtime as needed for muscle spasms.  15 tablet  0          Objective:   Physical Exam  Constitutional: She is oriented to person, place, and time. She appears well-developed and well-nourished.  HENT:  Head: Normocephalic and atraumatic.  Cardiovascular: Normal rate, regular rhythm and normal heart sounds.   Pulmonary/Chest: Effort normal and breath sounds normal.  Abdominal: Soft. She exhibits no distension and no mass. There is no tenderness. There is no rebound and no guarding.  Musculoskeletal:       Lumbar back pain with normal flexion, extesnoin, normal rotation. She is tender over the right flank. Non tender over the spine.   Neurological: She is alert and oriented to person, place, and time.  Skin: Skin is warm and dry.  Psychiatric: She has a normal mood and affect. Her behavior is normal.          Assessment & Plan:  Right  flank pain- Likely MSK.  Discussed using an NSAID. She declined a muscle relaxer today. Discussed gentle stretches and given H.O. Call if runs a fever, gets more intense, notices hematuria or just not better. Call if any SOB. UA is neg for blood or infection.

## 2011-09-16 ENCOUNTER — Other Ambulatory Visit: Payer: Self-pay | Admitting: Internal Medicine

## 2011-12-24 LAB — HM COLONOSCOPY: HM Colonoscopy: NEGATIVE

## 2011-12-24 LAB — HM PAP SMEAR: HM Pap smear: NEGATIVE

## 2012-04-13 ENCOUNTER — Other Ambulatory Visit (INDEPENDENT_AMBULATORY_CARE_PROVIDER_SITE_OTHER): Payer: 59

## 2012-04-13 ENCOUNTER — Ambulatory Visit (INDEPENDENT_AMBULATORY_CARE_PROVIDER_SITE_OTHER)
Admission: RE | Admit: 2012-04-13 | Discharge: 2012-04-13 | Disposition: A | Discharge status: Home / Self Care | Payer: 59 | Source: Ambulatory Visit | Attending: Internal Medicine | Admitting: Internal Medicine

## 2012-04-13 ENCOUNTER — Encounter: Payer: Self-pay | Admitting: Internal Medicine

## 2012-04-13 ENCOUNTER — Ambulatory Visit (INDEPENDENT_AMBULATORY_CARE_PROVIDER_SITE_OTHER): Payer: 59 | Admitting: Internal Medicine

## 2012-04-13 VITALS — BP 100/68 | HR 78 | Temp 98.2°F | Resp 16 | Ht 64.0 in | Wt 227.0 lb

## 2012-04-13 DIAGNOSIS — Z0000 Encounter for general adult medical examination without abnormal findings: Secondary | ICD-10-CM

## 2012-04-13 DIAGNOSIS — M5431 Sciatica, right side: Secondary | ICD-10-CM

## 2012-04-13 DIAGNOSIS — M5430 Sciatica, unspecified side: Secondary | ICD-10-CM

## 2012-04-13 DIAGNOSIS — F3289 Other specified depressive episodes: Secondary | ICD-10-CM

## 2012-04-13 DIAGNOSIS — F329 Major depressive disorder, single episode, unspecified: Secondary | ICD-10-CM

## 2012-04-13 DIAGNOSIS — F32A Depression, unspecified: Secondary | ICD-10-CM

## 2012-04-13 DIAGNOSIS — F3340 Major depressive disorder, recurrent, in remission, unspecified: Secondary | ICD-10-CM | POA: Insufficient documentation

## 2012-04-13 LAB — BASIC METABOLIC PANEL
BUN: 11 mg/dL (ref 6–23)
CO2: 29 mEq/L (ref 19–32)
Chloride: 101 mEq/L (ref 96–112)
Creatinine, Ser: 0.6 mg/dL (ref 0.4–1.2)
Glucose, Bld: 91 mg/dL (ref 70–99)

## 2012-04-13 LAB — URINALYSIS, ROUTINE W REFLEX MICROSCOPIC
Bilirubin Urine: NEGATIVE
Hgb urine dipstick: NEGATIVE
Nitrite: NEGATIVE
Urobilinogen, UA: 0.2 (ref 0.0–1.0)

## 2012-04-13 LAB — CBC WITH DIFFERENTIAL/PLATELET
Basophils Absolute: 0.2 10*3/uL — ABNORMAL HIGH (ref 0.0–0.1)
Eosinophils Absolute: 0.2 10*3/uL (ref 0.0–0.7)
HCT: 35.9 % — ABNORMAL LOW (ref 36.0–46.0)
Lymphs Abs: 2.2 10*3/uL (ref 0.7–4.0)
MCHC: 32.3 g/dL (ref 30.0–36.0)
MCV: 86.8 fl (ref 78.0–100.0)
Monocytes Absolute: 0.4 10*3/uL (ref 0.1–1.0)
Neutrophils Relative %: 62.4 % (ref 43.0–77.0)
Platelets: 383 10*3/uL (ref 150.0–400.0)
RDW: 15.1 % — ABNORMAL HIGH (ref 11.5–14.6)
WBC: 8.2 10*3/uL (ref 4.5–10.5)

## 2012-04-13 LAB — HEPATIC FUNCTION PANEL
Alkaline Phosphatase: 65 U/L (ref 39–117)
Bilirubin, Direct: 0 mg/dL (ref 0.0–0.3)
Total Bilirubin: 0.4 mg/dL (ref 0.3–1.2)
Total Protein: 7.7 g/dL (ref 6.0–8.3)

## 2012-04-13 LAB — LIPID PANEL
Cholesterol: 145 mg/dL (ref 0–200)
LDL Cholesterol: 92 mg/dL (ref 0–99)

## 2012-04-13 MED ORDER — BUPROPION HCL ER (XL) 150 MG PO TB24: 150.0000 mg | ORAL_TABLET | Freq: Every day | ORAL | Status: DC

## 2012-04-13 MED ORDER — CLONAZEPAM 0.5 MG PO TABS: 0.2500 mg | ORAL_TABLET | Freq: Two times a day (BID) | ORAL | Status: DC | PRN

## 2012-04-13 NOTE — Progress Notes (Signed)
Subjective:    Patient ID: Donna Robbins, female    DOB: 06-26-1970, 42 y.o.   MRN: [id number]  HPI See CC - several concerns R hip pain - located laterally Pain radiates from buttock down to calf - Pain worse with sitting on soft, unsupported seat - Not exacerbated with walking, lying on R side or direct palpation. No weakness, numbness or falls - no injry or MVA recalled Onset >6 mo ago, initially intermittent but no present majority days of the week. Improved with exercise, stretching and OTC NSAIDs  Also complains of increase in depression symptoms - Precipitated by stressors: elderly/aging parents, work stress and 30 yo son with anxiety Stopped celexa as rx'd for anxiety in 2012 due to weight gain side effects Using BZ low dose bid prn - ?other med Verifies no SI/HI  Also patient is here today for annual physical.   Past Medical History  Diagnosis Date  . OBESITY   . HEARING LOSS   . Irritable bowel syndrome   . Allergy   . Anxiety    Family History  Problem Relation Age of Onset  . Arthritis Maternal Grandmother     grandfather  . Diabetes Mother     parent  . Hyperlipidemia Other     parents  . Lung cancer Maternal Grandmother     Grandfather  . Arthritis      Both parents  . Cancer Father     Angio sarcoma   History  Substance Use Topics  . Smoking status: Never Smoker   . Smokeless tobacco: Never Used  . Alcohol Use: Yes     Comment: Rarely     Review of Systems Constitutional: Negative for fever - ongoing unexplained weight gain.  Respiratory: Negative for cough and shortness of breath.   Cardiovascular: Negative for chest pain or palpitations.  Gastrointestinal: Negative for abdominal pain, no bowel changes.  Musculoskeletal: Negative for gait problem or joint swelling.  Skin: Negative for rash.  Neurological: Negative for dizziness or headache.  No other specific complaints in a complete review of systems (except as listed in HPI  above).     Objective:   Physical Exam BP 100/68  Pulse 78  Temp 98.2 F (36.8 C) (Oral)  Resp 16  Ht 5\' 4"  (1.626 m)  Wt 227 lb (102.967 kg)  BMI 38.96 kg/m2  SpO2 97%  LMP 04/06/2012 Wt Readings from Last 3 Encounters:  04/13/12 227 lb (102.967 kg)  07/13/11 235 lb (106.595 kg)  01/25/11 220 lb 6.4 oz (99.973 kg)   Constitutional: She is overweight, but appears well-developed and well-nourished. No distress.  HENT: Head: Normocephalic and atraumatic. Ears: B TMs ok, no erythema or effusion; Nose: Nose normal. Mouth/Throat: Oropharynx is clear and moist. No oropharyngeal exudate.  Eyes: Conjunctivae and EOM are normal. Pupils are equal, round, and reactive to light. No scleral icterus.  Neck: Normal range of motion. Neck supple. No JVD present. No thyromegaly present.  Cardiovascular: Normal rate, regular rhythm and normal heart sounds.  No murmur heard. No BLE edema. Pulmonary/Chest: Effort normal and breath sounds normal. No respiratory distress. She has no wheezes.  Abdominal: Soft. Bowel sounds are normal. She exhibits no distension. There is no tenderness. no masses Musculoskeletal: R hip normal range of motion, no joint effusions. Nontender over groin palp and greater troch pain. No gross deformities. Back: full range of motion of thoracic and lumbar spine. Non tender to palpation. Negative straight leg raise. DTR's are symmetrically intact. Sensation intact  in all dermatomes of the lower extremities. Full strength to manual muscle testing. patient is able to heel toe walk without difficulty and ambulates with minimally antalgic gait.  Neurological: She is alert and oriented to person, place, and time. No cranial nerve deficit. Coordination normal.  Skin: Skin is warm and dry. No rash noted. No erythema.  Psychiatric: She has a dysphoric mood and occ tearful affect. Her behavior is normal. Judgment and thought content normal.   Lab Results  Component Value Date   WBC 10.1  05/24/2010   HGB 13.0 05/24/2010   HCT 38.7 05/24/2010   PLT 315 05/24/2010   GLUCOSE 88 05/24/2010   CHOL 147 01/23/2010   HDL 41.0 01/23/2010   LDLCALC 84 01/23/2010   ALT 15 01/23/2010   AST 15 01/23/2010   NA 139 05/24/2010   K 3.7 05/24/2010   CL 104 05/24/2010   CREATININE 0.53 05/24/2010   BUN 6 05/24/2010   CO2 28 05/24/2010   TSH 1.15 05/24/2010       Assessment & Plan:  CPX/v70.0 - Patient has been counseled on age-appropriate routine health concerns for screening and prevention. These are reviewed and up-to-date. Immunizations are up-to-date or declined. Labs ordered and reviewed.  R hip pain, lateral -hx/exam consistent with sciatica - ongoing > 6 months - previously intermittent, now constant - MSkel of R hip benign - suspect symptoms from DDD with RLE radiation (sciatica) - check DG lumbar - no acute flare, recommended OTC NSAIDs, core exercise (declines need for PT) and call if worse or acute increase in symptoms for other tx as needed. Also reviewed importance of weight loss/control for mgmt of back symptoms

## 2012-04-13 NOTE — Patient Instructions (Signed)
It was good to see you today. We have reviewed your prior records including labs and tests today Test(s) ordered today - labs and xray back. Your results will be released to MyChart (or called to you) after review, usually within 72hours after test completion. If any changes need to be made, you will be notified at that same time. Health Maintenance reviewed - all recommended immunizations and age-appropriate screenings are up-to-date. Medications reviewed - start welbutrin xl 150mg  daily in addition to clonazepam as needed - Your prescription(s) have been submitted to your pharmacy. Please take as directed and contact our office if you believe you are having problem(s) with the medication(s). no other medication changes at this time. Please schedule followup in 4-6 weeks for mood recheck, call sooner if problems.  Health Maintenance, Females A healthy lifestyle and preventative care can promote health and wellness.  Maintain regular health, dental, and eye exams.   Eat a healthy diet. Foods like vegetables, fruits, whole grains, low-fat dairy products, and lean protein foods contain the nutrients you need without too many calories. Decrease your intake of foods high in solid fats, added sugars, and salt. Get information about a proper diet from your caregiver, if necessary.   Regular physical exercise is one of the most important things you can do for your health. Most adults should get at least 150 minutes of moderate-intensity exercise (any activity that increases your heart rate and causes you to sweat) each week. In addition, most adults need muscle-strengthening exercises on 2 or more days a week.     Maintain a healthy weight. The body mass index (BMI) is a screening tool to identify possible weight problems. It provides an estimate of body fat based on height and weight. Your caregiver can help determine your BMI, and can help you achieve or maintain a healthy weight. For adults 20 years and  older:   A BMI below 18.5 is considered underweight.   A BMI of 18.5 to 24.9 is normal.   A BMI of 25 to 29.9 is considered overweight.   A BMI of 30 and above is considered obese.   Maintain normal blood lipids and cholesterol by exercising and minimizing your intake of saturated fat. Eat a balanced diet with plenty of fruits and vegetables. Blood tests for lipids and cholesterol should begin at age 74 and be repeated every 5 years. If your lipid or cholesterol levels are high, you are over 50, or you are a high risk for heart disease, you may need your cholesterol levels checked more frequently. Ongoing high lipid and cholesterol levels should be treated with medicines if diet and exercise are not effective.   If you smoke, find out from your caregiver how to quit. If you do not use tobacco, do not start.   If you are pregnant, do not drink alcohol. If you are breastfeeding, be very cautious about drinking alcohol. If you are not pregnant and choose to drink alcohol, do not exceed 1 drink per day. One drink is considered to be 12 ounces (355 mL) of beer, 5 ounces (148 mL) of wine, or 1.5 ounces (44 mL) of liquor.   Avoid use of street drugs. Do not share needles with anyone. Ask for help if you need support or instructions about stopping the use of drugs.   High blood pressure causes heart disease and increases the risk of stroke. Blood pressure should be checked at least every 1 to 2 years. Ongoing high blood pressure should be  treated with medicines, if weight loss and exercise are not effective.   If you are 40 to 43 years old, ask your caregiver if you should take aspirin to prevent strokes.   Diabetes screening involves taking a blood sample to check your fasting blood sugar level. This should be done once every 3 years, after age 34, if you are within normal weight and without risk factors for diabetes. Testing should be considered at a younger age or be carried out more frequently if  you are overweight and have at least 1 risk factor for diabetes.   Breast cancer screening is essential preventative care for women. You should practice "breast self-awareness." This means understanding the normal appearance and feel of your breasts and may include breast self-examination. Any changes detected, no matter how small, should be reported to a caregiver. Women in their 90s and 30s should have a clinical breast exam (CBE) by a caregiver as part of a regular health exam every 1 to 3 years. After age 53, women should have a CBE every year. Starting at age 34, women should consider having a mammogram (breast X-ray) every year. Women who have a family history of breast cancer should talk to their caregiver about genetic screening. Women at a high risk of breast cancer should talk to their caregiver about having an MRI and a mammogram every year.   The Pap test is a screening test for cervical cancer. Women should have a Pap test starting at age 67. Between ages 58 and 69, Pap tests should be repeated every 2 years. Beginning at age 63, you should have a Pap test every 3 years as long as the past 3 Pap tests have been normal. If you had a hysterectomy for a problem that was not cancer or a condition that could lead to cancer, then you no longer need Pap tests. If you are between ages 39 and 74, and you have had normal Pap tests going back 10 years, you no longer need Pap tests. If you have had past treatment for cervical cancer or a condition that could lead to cancer, you need Pap tests and screening for cancer for at least 20 years after your treatment. If Pap tests have been discontinued, risk factors (such as a new sexual partner) need to be reassessed to determine if screening should be resumed. Some women have medical problems that increase the chance of getting cervical cancer. In these cases, your caregiver may recommend more frequent screening and Pap tests.   The human papillomavirus (HPV) test  is an additional test that may be used for cervical cancer screening. The HPV test looks for the virus that can cause the cell changes on the cervix. The cells collected during the Pap test can be tested for HPV. The HPV test could be used to screen women aged 19 years and older, and should be used in women of any age who have unclear Pap test results. After the age of 86, women should have HPV testing at the same frequency as a Pap test.   Colorectal cancer can be detected and often prevented. Most routine colorectal cancer screening begins at the age of 60 and continues through age 90. However, your caregiver may recommend screening at an earlier age if you have risk factors for colon cancer. On a yearly basis, your caregiver may provide home test kits to check for hidden blood in the stool. Use of a small camera at the end of a tube, to  directly examine the colon (sigmoidoscopy or colonoscopy), can detect the earliest forms of colorectal cancer. Talk to your caregiver about this at age 54, when routine screening begins. Direct examination of the colon should be repeated every 5 to 10 years through age 40, unless early forms of pre-cancerous polyps or small growths are found.   Hepatitis C blood testing is recommended for all people born from [birth year] through 1966 and any individual with known risks for hepatitis C.   Practice safe sex. Use condoms and avoid high-risk sexual practices to reduce the spread of sexually transmitted infections (STIs). Sexually active women aged 30 and younger should be checked for Chlamydia, which is a common sexually transmitted infection. Older women with new or multiple partners should also be tested for Chlamydia. Testing for other STIs is recommended if you are sexually active and at increased risk.   Osteoporosis is a disease in which the bones lose minerals and strength with aging. This can result in serious bone fractures. The risk of osteoporosis can be identified using a  bone density scan. Women ages 69 and over and women at risk for fractures or osteoporosis should discuss screening with their caregivers. Ask your caregiver whether you should be taking a calcium supplement or vitamin D to reduce the rate of osteoporosis.   Menopause can be associated with physical symptoms and risks. Hormone replacement therapy is available to decrease symptoms and risks. You should talk to your caregiver about whether hormone replacement therapy is right for you.   Use sunscreen with a sun protection factor (SPF) of 30 or greater. Apply sunscreen liberally and repeatedly throughout the day. You should seek shade when your shadow is shorter than you. Protect yourself by wearing long sleeves, pants, a wide-brimmed hat, and sunglasses year round, whenever you are outdoors.   Notify your caregiver of new moles or changes in moles, especially if there is a change in shape or color. Also notify your caregiver if a mole is larger than the size of a pencil eraser.   Stay current with your immunizations.  Document Released: 09/24/2010 Document Revised: 06/03/2011 Document Reviewed: 09/24/2010 [facility] Patient Information 2013 [place], [place].   Exercise to Lose Weight Exercise and a healthy diet may help you lose weight. Your doctor may suggest specific exercises. EXERCISE IDEAS AND TIPS  Choose low-cost things you enjoy doing, such as walking, bicycling, or exercising to workout videos.   Take stairs instead of the elevator.   Walk during your lunch break.   Park your car further away from work or school.   Go to a gym or an exercise class.   Start with 5 to 10 minutes of exercise each day. Build up to 30 minutes of exercise 4 to 6 days a week.   Wear shoes with good support and comfortable clothes.   Stretch before and after working out.   Work out until you breathe harder and your heart beats faster.   Drink extra water when you exercise.   Do not do so much that you  hurt yourself, feel dizzy, or get very short of breath.  Exercises that burn about 150 calories:  Running 1  miles in 15 minutes.   Playing volleyball for 45 to 60 minutes.   Washing and waxing a car for 45 to 60 minutes.   Playing touch football for 45 minutes.   Walking 1  miles in 35 minutes.   Pushing a stroller 1  miles in 30 minutes.   Playing  basketball for 30 minutes.   Raking leaves for 30 minutes.   Bicycling 5 miles in 30 minutes.   Walking 2 miles in 30 minutes.   Dancing for 30 minutes.   Shoveling snow for 15 minutes.   Swimming laps for 20 minutes.   Walking up stairs for 15 minutes.   Bicycling 4 miles in 15 minutes.   Gardening for 30 to 45 minutes.   Jumping rope for 15 minutes.   Washing windows or floors for 45 to 60 minutes.  Document Released: 04/13/2010 Document Revised: 06/03/2011 Document Reviewed: 04/13/2010 [facility] Patient Information 2013 [place], [place].   Back Exercises Back exercises help treat and prevent back injuries. The goal is to increase your strength in your belly (abdominal) and back muscles. These exercises can also help with flexibility. Start these exercises when told by your doctor. HOME CARE Back exercises include: Pelvic Tilt.  Lie on your back with your knees bent. Tilt your pelvis until the lower part of your back is against the floor. Hold this position 5 to 10 sec. Repeat this exercise 5 to 10 times.  Knee to Chest.  Pull 1 knee up against your chest and hold for 20 to 30 seconds. Repeat this with the other knee. This may be done with the other leg straight or bent, whichever feels better. Then, pull both knees up against your chest.  Sit-Ups or Curl-Ups.  Bend your knees 90 degrees. Start with tilting your pelvis, and do a partial, slow sit-up. Only lift your upper half 30 to 45 degrees off the floor. Take at least 2 to 3 seonds for each sit-up. Do not do sit-ups with your knees out straight. If partial sit-ups  are difficult, simply do the above but with only tightening your belly (abdominal) muscles and holding it as told.  Hip-Lift.  Lie on your back with your knees flexed 90 degrees. Push down with your feet and shoulders as you raise your hips 2 inches off the floor. Hold for 10 seconds, repeat 5 to 10 times.  Back Arches.  Lie on your stomach. Prop yourself up on bent elbows. Slowly press on your hands, causing an arch in your low back. Repeat 3 to 5 times.  Shoulder-Lifts.  Lie face down with arms beside your body. Keep hips and belly pressed to floor as you slowly lift your head and shoulders off the floor.  Do not overdo your exercises. Be careful in the beginning. Exercises may cause you some mild back discomfort. If the pain lasts for more than 15 minutes, stop the exercises until you see your doctor. Improvement with exercise for back problems is slow.   Document Released: 04/13/2010 Document Revised: 06/03/2011 Document Reviewed: 01/10/2011 [facility] Patient Information 2013 [place], [place].

## 2012-04-13 NOTE — Assessment & Plan Note (Signed)
Long hx anxiety - previously on celexa - stopped same early 2013 due to weight gain side effects Now increasing depression symptoms - counseled on same - support offered Pt willing to resume alt med therapy - Wellbutrin chosen for least weight gain side effects profile Ok to continue BZ prn with hopes of decreased needed use once symptoms better controlled - refill today we reviewed potential risk/benefit and possible side effects - pt understands and agrees to same  follow up 4-6 weeks for med titration or alternate med selection if needed, pt to call sooner if problems

## 2012-05-12 ENCOUNTER — Encounter: Payer: Self-pay | Admitting: Internal Medicine

## 2012-05-21 ENCOUNTER — Telehealth: Payer: Self-pay | Admitting: *Deleted

## 2012-05-21 ENCOUNTER — Encounter: Payer: Self-pay | Admitting: Internal Medicine

## 2012-05-21 ENCOUNTER — Telehealth: Payer: Self-pay | Admitting: Internal Medicine

## 2012-05-21 DIAGNOSIS — M549 Dorsalgia, unspecified: Secondary | ICD-10-CM

## 2012-05-21 NOTE — Telephone Encounter (Signed)
Left msg on vm stating yes she would like the referral to see Ortho. She saw Dr. August Saucer in the past @ [facility]...Raechel Chute

## 2012-05-21 NOTE — Telephone Encounter (Signed)
Caller: Donna Robbins/Patient; Phone: [phone]; Reason for Call: 2 issues 1.  She has been on Welbutrin for a month and she is satisfied and feels it is working for purpose intended.  The only thing is she is not sleeping well at times she will sometimes wake up in a sweat and sometimes feel heart is pounding She has no actual chest pain and is fine during the day.  She was wondering if this is adjustment issue on the Welbutrin she just started month 2.  Again she said she is very happy with how Welbutrin is working 2.  She had xray with [place] d/t back pain.  She said pain has gotten worse and goes from hip to foot.  Describes as constant ache, worse if she sits.  No numbness.  She is wondering if she should come back her for this or if you want her to follow up with her ortho doctor

## 2012-05-21 NOTE — Telephone Encounter (Signed)
Called pt no answer LMOM md response...lmb 

## 2012-05-21 NOTE — Telephone Encounter (Signed)
1) given wellbutrin more time to work and let us know if continued nighttime awakening and heart pounding (ie, OV to review) 2) I would recommended ortho eval for her back- let me know if referral needed

## 2012-05-22 NOTE — Telephone Encounter (Signed)
Pt was made aware will received call back from [facility] ([place] once referral has been set-up...Raechel Chute

## 2012-05-22 NOTE — Telephone Encounter (Signed)
ordered

## 2012-06-08 ENCOUNTER — Telehealth: Payer: Self-pay | Admitting: Internal Medicine

## 2012-06-08 DIAGNOSIS — M545 Low back pain, unspecified: Secondary | ICD-10-CM

## 2012-06-08 NOTE — Telephone Encounter (Signed)
Pt would like another referral for her back and legs.  She cancelled the other appt. Because that was not the doctor where her films were.  She had given [place] the wrong doctor's name.  She has seen Dr. Margaretha Sheffield, but he is no longer at the same place.  Her films are at [employer] on [facility].  She wants to go to the one Dr. Felicity Coyer thinks will be best.  She will come here for another appt if needed.

## 2012-06-08 NOTE — Telephone Encounter (Signed)
Referral to [facility] as per request - Dr Charlett Blake [facility] will call re: same

## 2012-06-11 ENCOUNTER — Encounter: Payer: Self-pay | Admitting: Internal Medicine

## 2012-06-15 ENCOUNTER — Ambulatory Visit: Payer: 59 | Admitting: Internal Medicine

## 2012-06-17 ENCOUNTER — Encounter: Payer: Self-pay | Admitting: Internal Medicine

## 2012-06-17 ENCOUNTER — Ambulatory Visit (INDEPENDENT_AMBULATORY_CARE_PROVIDER_SITE_OTHER): Payer: 59 | Admitting: Internal Medicine

## 2012-06-17 VITALS — BP 120/82 | HR 77 | Temp 98.1°F | Wt 208.4 lb

## 2012-06-17 DIAGNOSIS — K4090 Unilateral inguinal hernia, without obstruction or gangrene, not specified as recurrent: Secondary | ICD-10-CM

## 2012-06-17 DIAGNOSIS — F32A Depression, unspecified: Secondary | ICD-10-CM

## 2012-06-17 DIAGNOSIS — R0789 Other chest pain: Secondary | ICD-10-CM

## 2012-06-17 DIAGNOSIS — F329 Major depressive disorder, single episode, unspecified: Secondary | ICD-10-CM

## 2012-06-17 DIAGNOSIS — G5700 Lesion of sciatic nerve, unspecified lower limb: Secondary | ICD-10-CM

## 2012-06-17 DIAGNOSIS — F3289 Other specified depressive episodes: Secondary | ICD-10-CM

## 2012-06-17 DIAGNOSIS — R071 Chest pain on breathing: Secondary | ICD-10-CM

## 2012-06-17 NOTE — Progress Notes (Signed)
  Subjective:    Patient ID: Donna Robbins, female    DOB: 09-22-1970, 42 y.o.   MRN: [id number]  HPI  complains of continued leg pain: R hip pain - located laterally and posterior - now also affecting L leg occ Worst sitting -office visit scheduled tomorrow with orthopedics to evaluate same  Also concerned about small lump in right groin panty line  Reports depression symptoms improved on Wellbutrin  Past Medical History  Diagnosis Date  . OBESITY   . HEARING LOSS   . Irritable bowel syndrome   . Allergy   . Anxiety     Review of Systems  Respiratory: Negative for cough and shortness of breath.   Cardiovascular: Negative for chest pain or palpitations.      Objective:   Physical Exam  BP 120/82  Pulse 77  Temp(Src) 98.1 F (36.7 C) (Oral)  Wt 208 lb 6.4 oz (94.53 kg)  BMI 35.75 kg/m2  SpO2 97% Wt Readings from Last 3 Encounters:  06/17/12 208 lb 6.4 oz (94.53 kg)  04/13/12 227 lb (102.967 kg)  07/13/11 235 lb (106.595 kg)   Constitutional: She appears well-developed and well-nourished. No distress.   Neck: Normal range of motion. Neck supple. No JVD present. No thyromegaly present.  Cardiovascular: Normal rate, regular rhythm and normal heart sounds.  No murmur heard. No BLE edema. Tender to palpation over costochondral margin and xiphoid process Pulmonary/Chest: Effort normal and breath sounds normal. No respiratory distress. She has no wheezes.  Abdominal: Soft. Bowel sounds are normal. She exhibits no distension. There is no tenderness. no masses - small firm mass in R inguinal region consistent with hernia vs lipoma. Musculoskeletal: R hip normal range of motion, no joint effusions. Nontender over groin palp and greater troch pain. No gross deformities. Back: full range of motion of thoracic and lumbar spine. Non tender to palpation. Negative straight leg raise. DTR's are symmetrically intact. Sensation intact in all dermatomes of the lower extremities. Full  strength to manual muscle testing. patient is able to heel toe walk without difficulty and ambulates with minimally antalgic gait. tender to palpation along ischial tuberosity and piriformis insertion. Good strength B hip extensors and flexors but pain with external stretch of piriformis Psychiatric: She has a normal mood and affect. Her behavior is normal. Judgment and thought content normal.   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 Education reassurance provided. Patient to call if any red flags arise  Costochondritis, recent viral URI and reviewed - anti-inflammatories recommended, symptoms have improved since onset. Patient to call if worse

## 2012-06-17 NOTE — Assessment & Plan Note (Signed)
Long hx anxiety - previously on celexa - stopped same early 2013 due to weight gain side effects Because of increasing symptoms, began Wellbutrin 03/2012- chosen for least weight gain side effects profile Reports symptoms have improved and weight gain trend has reversed Ok to continue BZ prn with hopes of decreased needed use once symptoms better controlled - The current medical regimen is effective;  continue present plan and medications.

## 2012-06-17 NOTE — Patient Instructions (Signed)
It was good to see you today. ibuprofen 600mg  3x/day for 5 days to help chest pain follow up ortho as planned - consider pyriformis syndrome and PT for same Monitor the groin lump and call if this becomes hard, swollen, larger or other problems, - otherwise, this is of no clinical concern Medications reviewed, no other changes at this time.

## 2012-06-30 ENCOUNTER — Encounter: Payer: Self-pay | Admitting: Internal Medicine

## 2012-07-02 ENCOUNTER — Encounter: Payer: Self-pay | Admitting: Internal Medicine

## 2012-07-14 ENCOUNTER — Encounter: Payer: Self-pay | Admitting: Internal Medicine

## 2012-07-14 MED ORDER — PANTOPRAZOLE SODIUM 40 MG PO TBEC: 40.0000 mg | DELAYED_RELEASE_TABLET | Freq: Every day | ORAL | Status: DC

## 2012-07-15 ENCOUNTER — Encounter: Payer: Self-pay | Admitting: Internal Medicine

## 2012-07-16 ENCOUNTER — Encounter: Payer: Self-pay | Admitting: Internal Medicine

## 2012-07-21 ENCOUNTER — Encounter: Payer: Self-pay | Admitting: Internal Medicine

## 2012-07-22 ENCOUNTER — Encounter: Payer: Self-pay | Admitting: Internal Medicine

## 2012-08-03 ENCOUNTER — Other Ambulatory Visit: Payer: Self-pay | Admitting: Internal Medicine

## 2012-08-04 ENCOUNTER — Encounter: Payer: Self-pay | Admitting: Internal Medicine

## 2012-08-04 ENCOUNTER — Ambulatory Visit (INDEPENDENT_AMBULATORY_CARE_PROVIDER_SITE_OTHER): Payer: 59 | Admitting: Internal Medicine

## 2012-08-04 VITALS — BP 112/92 | HR 86 | Temp 98.3°F | Wt 199.8 lb

## 2012-08-04 DIAGNOSIS — M5010 Cervical disc disorder with radiculopathy, unspecified cervical region: Secondary | ICD-10-CM

## 2012-08-04 DIAGNOSIS — K219 Gastro-esophageal reflux disease without esophagitis: Secondary | ICD-10-CM

## 2012-08-04 DIAGNOSIS — M5412 Radiculopathy, cervical region: Secondary | ICD-10-CM

## 2012-08-04 MED ORDER — ESOMEPRAZOLE MAGNESIUM 40 MG PO CPDR: 40.0000 mg | DELAYED_RELEASE_CAPSULE | Freq: Every day | ORAL | Status: DC

## 2012-08-04 NOTE — Patient Instructions (Signed)
It was good to see you today. We have reviewed your prior records including labs and tests today Change protonix to nexium - Your prescription(s) have been submitted to your pharmacy. Please take as directed and contact our office if you believe you are having problem(s) with the medication(s). Continue tylenol as ongoing, NSAIDs if uncontrolled Consider MRI neck if arm/neck symptoms unimproved or worse

## 2012-08-04 NOTE — Assessment & Plan Note (Signed)
Long history of same, prior GI evaluation reviewed Changed from Nexium to Protonix early 2014 for formulary cost savings Change back to Nexium for symptom relief given prior effectiveness and currently uncontrolled symptoms Patient will call if unimproved or worse Reviewed recent labs, exam today unremarkable

## 2012-08-04 NOTE — Progress Notes (Signed)
  Subjective:    Patient ID: Donna Robbins, female    DOB: 01/18/1971, 42 y.o.   MRN: [id number]  Gastrophageal Reflux She complains of abdominal pain, chest pain and heartburn. She reports no choking, no coughing, no dysphagia, no early satiety, no globus sensation, no hoarse voice, no sore throat or no wheezing. This is a recurrent problem. The problem occurs frequently. The problem has been waxing and waning. The heartburn duration is more than one hour. The heartburn is located in the substernum. The heartburn is of moderate intensity. The symptoms are aggravated by stress, tight clothes, lying down and medications. She has tried a PPI for the symptoms.    Past Medical History  Diagnosis Date  . OBESITY   . HEARING LOSS   . Irritable bowel syndrome   . Allergy   . Anxiety   . GERD (gastroesophageal reflux disease)     Review of Systems  HENT: Negative for sore throat and hoarse voice.   Respiratory: Negative for cough, choking and wheezing.   Cardiovascular: Positive for chest pain.  Gastrointestinal: Positive for heartburn and abdominal pain. Negative for dysphagia.       Objective:   Physical Exam  BP 112/92  Pulse 86  Temp(Src) 98.3 F (36.8 C) (Oral)  Wt 199 lb 12.8 oz (90.629 kg)  BMI 34.28 kg/m2  SpO2 98% Wt Readings from Last 3 Encounters:  08/04/12 199 lb 12.8 oz (90.629 kg)  06/17/12 208 lb 6.4 oz (94.53 kg)  04/13/12 227 lb (102.967 kg)   Constitutional: She appears well-developed and well-nourished. No distress.   Neck: Normal range of motion. Neck supple. No JVD present. No thyromegaly present.  Cardiovascular: Normal rate, regular rhythm and normal heart sounds.  No murmur heard. No BLE edema. nontender to palpation over sternum and anterior chest Pulmonary/Chest: Effort normal and breath sounds normal. No respiratory distress. She has no wheezes.  Abdominal: Soft. Bowel sounds are normal. She exhibits no distension. There is no tenderness. no masses  - small firm mass in R inguinal region consistent with lipoma. Psychiatric: She has a min anxious (rarely tearful) mood and affect. Her behavior is appropriate. Judgment and thought content normal.   Lab Results  Component Value Date   WBC 8.2 04/13/2012   HGB 11.6* 04/13/2012   HCT 35.9* 04/13/2012   PLT 383.0 04/13/2012   GLUCOSE 91 04/13/2012   CHOL 145 04/13/2012   TRIG 78.0 04/13/2012   HDL 37.00* 04/13/2012   LDLCALC 92 04/13/2012   ALT 14 04/13/2012   AST 16 04/13/2012   NA 137 04/13/2012   K 4.0 04/13/2012   CL 101 04/13/2012   CREATININE 0.6 04/13/2012   BUN 11 04/13/2012   CO2 29 04/13/2012   TSH 0.75 04/13/2012       Assessment & Plan:   See problem list. Medications and labs reviewed today.  Cervical radiculopathy symptoms -  Describes left greater than right side arm, neck and head "numbness and weakness" symptoms with use and overextension of upper extremities/shoulder - Musculoskeletal and neuro exam benign today Denies recent injury Advised to continue anti-inflammatories Emotional support provided for family stressors (dad with terminal illness) Patient will call if worse symptoms despite conservative care for evaluation to include MRI neck as needed

## 2012-08-24 ENCOUNTER — Encounter: Payer: Self-pay | Admitting: Internal Medicine

## 2012-08-25 ENCOUNTER — Encounter: Payer: Self-pay | Admitting: Internal Medicine

## 2012-08-31 ENCOUNTER — Other Ambulatory Visit: Payer: Self-pay | Admitting: Internal Medicine

## 2012-09-11 ENCOUNTER — Encounter: Payer: Self-pay | Admitting: Internal Medicine

## 2012-09-16 ENCOUNTER — Encounter: Payer: Self-pay | Admitting: Internal Medicine

## 2012-09-19 ENCOUNTER — Encounter: Payer: Self-pay | Admitting: Internal Medicine

## 2012-11-17 ENCOUNTER — Encounter: Payer: Self-pay | Admitting: Internal Medicine

## 2012-11-17 LAB — HM MAMMOGRAPHY

## 2013-01-28 ENCOUNTER — Other Ambulatory Visit: Payer: Self-pay

## 2013-02-16 ENCOUNTER — Encounter: Payer: Self-pay | Admitting: Internal Medicine

## 2013-02-16 ENCOUNTER — Ambulatory Visit (INDEPENDENT_AMBULATORY_CARE_PROVIDER_SITE_OTHER): Payer: 59 | Admitting: Internal Medicine

## 2013-02-16 VITALS — BP 110/90 | HR 93 | Ht 64.0 in | Wt 198.0 lb

## 2013-02-16 DIAGNOSIS — R002 Palpitations: Secondary | ICD-10-CM

## 2013-02-16 NOTE — Assessment & Plan Note (Signed)
This is a new problem for the patient. The etiology is uncertain though I suspect she is having either premature atrial contractions or premature ventricular contractions. I would doubt atrial fibrillation. I've recommended watchful waiting. If her symptoms recur, we would have her wear a cardiac monitor.  I've encouraged the patient to reduce her caffeine intake, alcohol intake, get plenty of rest.

## 2013-02-16 NOTE — Progress Notes (Signed)
HPI Donna Robbins is referred today for evaluation of palpitations. 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 She has been under increased stress lately and had a decrease in sleep.  Today while at work, she developed palpitations lasting over an hour. Her coworkers checked her pulse and noted that it was irregular. There was not an EKG in her office. She has not had syncope or sustained heart racing. She denies chest pain or shortness of breath. Allergies  Allergen Reactions  . Iodine     REACTION: Itching  . Sulfonamide Derivatives     REACTION: Itching Rash     Current Outpatient Prescriptions  Medication Sig Dispense Refill  . acetaminophen (TYLENOL) 325 MG tablet Take 650 mg by mouth every 6 (six) hours as needed.        Marland Kitchen buPROPion (WELLBUTRIN XL) 150 MG 24 hr tablet TAKE 1 TABLET BY MOUTH DAILY.  30 tablet  8  . clonazePAM (KLONOPIN) 0.5 MG tablet TAKE 1/2-1 TABLET BY MOUTH 2 TIMES DAILY AS NEEDED FOR ANXIETY  30 tablet  1  . esomeprazole (NEXIUM) 40 MG capsule Take 1 capsule (40 mg total) by mouth daily.  90 capsule  3  . naproxen sodium (ANAPROX) 220 MG tablet Take 220 mg by mouth as needed.       . pseudoephedrine (SUDAFED) 30 MG tablet Take 30 mg by mouth as needed.         No current facility-administered medications for this visit.     Past Medical History  Diagnosis Date  . OBESITY   . HEARING LOSS   . Irritable bowel syndrome   . Allergy   . Anxiety   . GERD (gastroesophageal reflux disease)     ROS:   All systems reviewed and negative except as noted in the HPI.   Past Surgical History  Procedure Laterality Date  . Cesarean section      x's 2  . Ent surgery  2004  . Cholecystectomy       Family History  Problem Relation Age of Onset  . Arthritis Maternal Grandmother     grandfather  . Diabetes Mother     parent  . Hyperlipidemia Other     parents  . Lung cancer  Maternal Grandmother     Grandfather  . Arthritis      Both parents  . Cancer Father     Angio sarcoma     History   Social History  . Marital Status: Married    Spouse Name: N/A    Number of Children: 2  . Years of Education: N/A   Occupational History  . CLINIC SITE MANAGER    Social History Main Topics  . Smoking status: Never Smoker   . Smokeless tobacco: Never Used  . Alcohol Use: Yes     Comment: Rarely  . Drug Use: No  . Sexual Activity: Not on file   Other Topics Concern  . Not on file   Social History Narrative  . No narrative on file     BP 110/90  Pulse 93  Ht 5\' 4"  (1.626 m)  Wt 198 lb (89.812 kg)  BMI 33.97 kg/m2  Physical Exam:  Well appearing 42 year old woman, NAD  HEENT: Unremarkable Neck:  6 cm JVD, no thyromegally Back:  No CVA tenderness Lungs:  Clear with no wheezes, rales, or rhonchi. HEART:  Regular rate rhythm, no murmurs,  no rubs, no clicks Abd:  soft, obese, positive bowel sounds, no organomegally, no rebound, no guarding Ext:  2 plus pulses, no edema, no cyanosis, no clubbing Skin:  No rashes no nodules Neuro:  CN II through XII intact, motor grossly intact  EKG - normal sinus rhythm with voltage criteria for left ventricular hypertrophy.   Assess/Plan:

## 2013-02-22 ENCOUNTER — Other Ambulatory Visit: Payer: Self-pay | Admitting: Internal Medicine

## 2013-02-23 ENCOUNTER — Encounter: Payer: Self-pay | Admitting: Internal Medicine

## 2013-05-14 ENCOUNTER — Ambulatory Visit (INDEPENDENT_AMBULATORY_CARE_PROVIDER_SITE_OTHER): Payer: Self-pay | Admitting: *Deleted

## 2013-05-14 DIAGNOSIS — I781 Nevus, non-neoplastic: Secondary | ICD-10-CM

## 2013-05-14 NOTE — Progress Notes (Signed)
X=.3% Sotradecol administered with a 27g butterfly.  Patient received a total of 6cc.  Cutaneous Laser:pulsed mode  810j/cm2  416ms delay  58ms duration  0.5 spot Total pulses: 416, total energy: 661.23  Total time: :05  Treated all areas of concern with a combo of sclero and CL. Tol well. Anticipate good results.  Photos: no  Compression stockings applied: yes

## 2013-06-16 ENCOUNTER — Ambulatory Visit (INDEPENDENT_AMBULATORY_CARE_PROVIDER_SITE_OTHER): Payer: 59

## 2013-06-16 VITALS — BP 126/79 | HR 84 | Resp 15 | Ht 64.0 in | Wt 195.0 lb

## 2013-06-16 DIAGNOSIS — M67472 Ganglion, left ankle and foot: Secondary | ICD-10-CM

## 2013-06-16 DIAGNOSIS — M778 Other enthesopathies, not elsewhere classified: Secondary | ICD-10-CM

## 2013-06-16 DIAGNOSIS — M6740 Ganglion, unspecified site: Secondary | ICD-10-CM

## 2013-06-16 DIAGNOSIS — M7750 Other enthesopathy of unspecified foot: Secondary | ICD-10-CM

## 2013-06-16 DIAGNOSIS — M79609 Pain in unspecified limb: Secondary | ICD-10-CM

## 2013-06-16 DIAGNOSIS — M779 Enthesopathy, unspecified: Secondary | ICD-10-CM

## 2013-06-16 NOTE — Patient Instructions (Signed)
Ganglion Cyst A ganglion cyst is a noncancerous, fluid-filled lump that occurs near joints or tendons. The ganglion cyst grows out of a joint or the lining of a tendon. It most often develops in the hand or wrist but can also develop in the shoulder, elbow, hip, knee, ankle, or foot. The round or oval ganglion can be pea sized or larger than a grape. Increased activity may enlarge the size of the cyst because more fluid starts to build up.  CAUSES  It is not completely known what causes a ganglion cyst to grow. However, it may be related to:  Inflammation or irritation around the joint.  An injury.  Repetitive movements or overuse.  Arthritis. SYMPTOMS  A lump most often appears in the hand or wrist, but can occur in other areas of the body. Generally, the lump is painless without other symptoms. However, sometimes pain can be felt during activity or when pressure is applied to the lump. The lump may even be tender to the touch. Tingling, pain, numbness, or muscle weakness can occur if the ganglion cyst presses on a nerve. Your grip may be weak and you may have less movement in your joints.  DIAGNOSIS  Ganglion cysts are most often diagnosed based on a physical exam, noting where the cyst is and how it looks. Your caregiver will feel the lump and may shine a light alongside it. If it is a ganglion, a light often shines through it. Your caregiver may order an X-ray, ultrasound, or MRI to rule out other conditions. TREATMENT  Ganglions usually go away on their own without treatment. If pain or other symptoms are involved, treatment may be needed. Treatment is also needed if the ganglion limits your movement or if it gets infected. Treatment options include:  Wearing a wrist or finger brace or splint.  Taking anti-inflammatory medicine.  Draining fluid from the lump with a needle (aspiration).  Injecting a steroid into the joint.  Surgery to remove the ganglion cyst and its stalk that is  attached to the joint or tendon. However, ganglion cysts can grow back. HOME CARE INSTRUCTIONS   Do not press on the ganglion, poke it with a needle, or hit it with a heavy object. You may rub the lump gently and often. Sometimes fluid moves out of the cyst.  Only take medicines as directed by your caregiver.  Wear your brace or splint as directed by your caregiver. SEEK MEDICAL CARE IF:   Your ganglion becomes larger or more painful.  You have increased redness, red streaks, or swelling.  You have pus coming from the lump.  You have weakness or numbness in the affected area. MAKE SURE YOU:   Understand these instructions.  Will watch your condition.  Will get help right away if you are not doing well or get worse. Document Released: 03/08/2000 Document Revised: 12/04/2011 Document Reviewed: 05/05/2007 [facility] Patient Information 2014 [place]. ICE INSTRUCTIONS  Apply ice or cold pack to the affected area at least 3 times a day for 10-15 minutes each time.  You should also use ice after prolonged activity or vigorous exercise.  Do not apply ice longer than 20 minutes at one time.  Always keep a cloth between your skin and the ice pack to prevent burns.  Being consistent and following these instructions will help control your symptoms.  We suggest you purchase a gel ice pack because they are reusable and do bit leak.  Some of them are designed to wrap  around the area.  Use the method that works best for you.  Here are some other suggestions for icing.   Use a frozen bag of peas or corn-inexpensive and molds well to your body, usually stays frozen for 10 to 20 minutes.  Wet a towel with cold water and squeeze out the excess until it's damp.  Place in a bag in the freezer for 20 minutes. Then remove and use.

## 2013-06-16 NOTE — Progress Notes (Signed)
   Subjective:    Patient ID: [name], female    DOB: 1971/03/06, 43 y.o.   MRN: [id number]  HPI N cyst        L left dorsal midfoot        D 02/2014 and 2 weeks ago        O about 5 years        C painful and lump        A shoes rub        T none  Pt states feels like she's walking on lumps beneath the 2nd toes, for about 3 months.  Pt states she has tried no treatment.    Review of Systems  Constitutional: Positive for activity change.  HENT: Positive for sinus pressure.   Eyes: Negative.   Respiratory: Negative.   Cardiovascular: Negative.   Gastrointestinal: Negative.   Endocrine: Negative.   Genitourinary: Negative.   Musculoskeletal: Negative.   Skin: Negative.   Allergic/Immunologic: Negative.   Neurological: Negative.   Hematological: Negative.   Psychiatric/Behavioral: Negative.        Objective:   Physical Exam Okay objective findings follows pedal pulses palpable DP postal for PT plus one over 4 bilateral Refill timed 3-4 seconds skin temperature warm turgor normal no edema rubor pallor or varicosities noted neurologically epicritic and proprioceptive sensations intact and symmetric bilateral there is normal plantar response DTRs not listed neurologically skin color pigment and hair growth are normal orthopedic biomechanical exam reveals pain tenderness on palpation of Lisfranc for fifth metatarsal base and cuboid patient is recurring ganglion cyst over the dorsal mid tarsus area of the left foot is not there now was there muscularly red inflamed is gone away again is aggravated when she walks on some uneven ground with good walking or treadmill as well patient does have some mild promontory changes and some subluxation Lisfranc's which may be contributing to the recurrence of the ganglion cyst which is currently not palpable. 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 and mild last arthropathy for fifth  metatarsal base and cuboid no signs of fracture or other osseous abnormalities noted neurologically skin color pigment and hair growth are normal nails normal trophic       Assessment & Plan:  Assessment this time does have operatory changes with capsulitis a Lisfranc joint with possibly associated recurrence of the ganglion cyst at times the ganglion cyst is stable at this time orthotics skin is carried out this time to help with the stability of the midfoot and prevent a recurrence of the capsulitis and Lisfranc's arthropathy as well as recurrence of the ganglion cyst if possible. We'll contact patient reappointed with the next 23 for orthotic fitting and dispensing at that time in the interim we'll monitor patient advised there is no other osseous abnormalities no cysts tumors the ganglion cyst is likely associated with recurrent trauma injury to the mid tarsus and Lisfranc joint.  Harriet Masson DPM

## 2013-07-05 ENCOUNTER — Other Ambulatory Visit: Payer: Self-pay

## 2013-07-05 ENCOUNTER — Encounter: Payer: Self-pay | Admitting: Internal Medicine

## 2013-07-05 MED ORDER — BUPROPION HCL ER (XL) 150 MG PO TB24: 150.0000 mg | ORAL_TABLET | Freq: Every day | ORAL | Status: DC

## 2013-07-05 MED ORDER — ESOMEPRAZOLE MAGNESIUM 40 MG PO CPDR: 40.0000 mg | DELAYED_RELEASE_CAPSULE | Freq: Every day | ORAL | Status: DC

## 2013-07-12 ENCOUNTER — Encounter: Payer: Self-pay | Admitting: Internal Medicine

## 2013-08-13 ENCOUNTER — Other Ambulatory Visit (INDEPENDENT_AMBULATORY_CARE_PROVIDER_SITE_OTHER): Payer: 59

## 2013-08-13 ENCOUNTER — Encounter: Payer: Self-pay | Admitting: Internal Medicine

## 2013-08-13 ENCOUNTER — Encounter: Payer: Self-pay | Admitting: *Deleted

## 2013-08-13 ENCOUNTER — Ambulatory Visit (INDEPENDENT_AMBULATORY_CARE_PROVIDER_SITE_OTHER)
Admission: RE | Admit: 2013-08-13 | Discharge: 2013-08-13 | Disposition: A | Discharge status: Home / Self Care | Payer: 59 | Source: Ambulatory Visit | Attending: Internal Medicine | Admitting: Internal Medicine

## 2013-08-13 ENCOUNTER — Ambulatory Visit (INDEPENDENT_AMBULATORY_CARE_PROVIDER_SITE_OTHER): Payer: 59 | Admitting: Internal Medicine

## 2013-08-13 VITALS — BP 112/84 | HR 69 | Temp 98.5°F | Ht 63.0 in | Wt 205.2 lb

## 2013-08-13 DIAGNOSIS — Z23 Encounter for immunization: Secondary | ICD-10-CM

## 2013-08-13 DIAGNOSIS — F419 Anxiety disorder, unspecified: Secondary | ICD-10-CM

## 2013-08-13 DIAGNOSIS — R209 Unspecified disturbances of skin sensation: Secondary | ICD-10-CM

## 2013-08-13 DIAGNOSIS — R202 Paresthesia of skin: Secondary | ICD-10-CM

## 2013-08-13 DIAGNOSIS — R200 Anesthesia of skin: Secondary | ICD-10-CM

## 2013-08-13 DIAGNOSIS — Z0000 Encounter for general adult medical examination without abnormal findings: Secondary | ICD-10-CM

## 2013-08-13 DIAGNOSIS — E669 Obesity, unspecified: Secondary | ICD-10-CM

## 2013-08-13 DIAGNOSIS — F411 Generalized anxiety disorder: Secondary | ICD-10-CM

## 2013-08-13 LAB — URINALYSIS, ROUTINE W REFLEX MICROSCOPIC
Bilirubin Urine: NEGATIVE
HGB URINE DIPSTICK: NEGATIVE
Ketones, ur: NEGATIVE
Leukocytes, UA: NEGATIVE
Nitrite: NEGATIVE
RBC / HPF: NONE SEEN (ref 0–?)
Specific Gravity, Urine: <=1.005 — AB (ref 1.000–1.030)
Total Protein, Urine: NEGATIVE
URINE GLUCOSE: NEGATIVE
UROBILINOGEN UA: 0.2 (ref 0.0–1.0)
WBC, UA: NONE SEEN (ref 0–?)
pH: 7 (ref 5.0–8.0)

## 2013-08-13 LAB — HEPATIC FUNCTION PANEL
ALT: 16 U/L (ref 0–35)
AST: 18 U/L (ref 0–37)
Albumin: 3.8 g/dL (ref 3.5–5.2)
Alkaline Phosphatase: 51 U/L (ref 39–117)
Bilirubin, Direct: 0 mg/dL (ref 0.0–0.3)
TOTAL PROTEIN: 7.2 g/dL (ref 6.0–8.3)
Total Bilirubin: 0.5 mg/dL (ref 0.2–1.2)

## 2013-08-13 LAB — CBC WITH DIFFERENTIAL/PLATELET
BASOS PCT: 0.4 % (ref 0.0–3.0)
Basophils Absolute: 0 10*3/uL (ref 0.0–0.1)
Eosinophils Absolute: 0.2 10*3/uL (ref 0.0–0.7)
Eosinophils Relative: 1.9 % (ref 0.0–5.0)
HEMATOCRIT: 38.9 % (ref 36.0–46.0)
HEMOGLOBIN: 13.1 g/dL (ref 12.0–15.0)
Lymphocytes Relative: 31 % (ref 12.0–46.0)
Lymphs Abs: 2.7 10*3/uL (ref 0.7–4.0)
MCHC: 33.6 g/dL (ref 30.0–36.0)
MCV: 94 fl (ref 78.0–100.0)
MONO ABS: 0.5 10*3/uL (ref 0.1–1.0)
Monocytes Relative: 6.3 % (ref 3.0–12.0)
NEUTROS ABS: 5.2 10*3/uL (ref 1.4–7.7)
NEUTROS PCT: 60.4 % (ref 43.0–77.0)
Platelets: 320 10*3/uL (ref 150.0–400.0)
RBC: 4.14 Mil/uL (ref 3.87–5.11)
RDW: 13.8 % (ref 11.5–15.5)
WBC: 8.6 10*3/uL (ref 4.0–10.5)

## 2013-08-13 LAB — LIPID PANEL
CHOL/HDL RATIO: 3
Cholesterol: 162 mg/dL (ref 0–200)
HDL: 47.8 mg/dL (ref >39.00)
LDL Cholesterol: 101 mg/dL — ABNORMAL HIGH (ref 0–99)
Triglycerides: 67 mg/dL (ref 0.0–149.0)
VLDL: 13.4 mg/dL (ref 0.0–40.0)

## 2013-08-13 LAB — BASIC METABOLIC PANEL
BUN: 14 mg/dL (ref 6–23)
CO2: 27 meq/L (ref 19–32)
CREATININE: 0.7 mg/dL (ref 0.4–1.2)
Calcium: 8.8 mg/dL (ref 8.4–10.5)
Chloride: 102 mEq/L (ref 96–112)
GFR: 103.44 mL/min (ref >60.00)
Glucose, Bld: 79 mg/dL (ref 70–99)
POTASSIUM: 4.2 meq/L (ref 3.5–5.1)
Sodium: 137 mEq/L (ref 135–145)

## 2013-08-13 LAB — TSH: TSH: 0.87 u[IU]/mL (ref 0.35–4.50)

## 2013-08-13 MED ORDER — CLONAZEPAM 0.5 MG PO TABS: 0.2500 mg | ORAL_TABLET | Freq: Two times a day (BID) | ORAL | Status: DC | PRN

## 2013-08-13 MED ORDER — ESOMEPRAZOLE MAGNESIUM 40 MG PO CPDR: 40.0000 mg | DELAYED_RELEASE_CAPSULE | Freq: Every day | ORAL | Status: DC

## 2013-08-13 NOTE — Progress Notes (Signed)
Pre visit review using our clinic review tool, if applicable. No additional management support is needed unless otherwise documented below in the visit note. 

## 2013-08-13 NOTE — Progress Notes (Signed)
Subjective:    Patient ID: Donna Robbins, female    DOB: 1970-07-18, 43 y.o.   MRN: [id number]  HPI  patient is here today for annual physical. Patient feels well in general.  Also reviewed chronic medical issues and interval medical events  Past Medical History  Diagnosis Date  . OBESITY   . HEARING LOSS   . Irritable bowel syndrome   . Allergy   . Anxiety   . GERD (gastroesophageal reflux disease)    Family History  Problem Relation Age of Onset  . Arthritis Maternal Grandmother     grandfather  . Diabetes Mother   . Hyperlipidemia Mother   . Lung cancer Maternal Grandmother   . Arthritis    . Cancer Father     Angio sarcoma  . Hyperlipidemia Father   . Lung cancer Maternal Grandfather    History  Substance Use Topics  . Smoking status: Never Smoker   . Smokeless tobacco: Never Used  . Alcohol Use: Yes     Comment: Rarely    Review of Systems  Constitutional: Positive for fatigue. Negative for unexpected weight change.  Respiratory: Negative for cough, shortness of breath and wheezing.   Cardiovascular: Negative for chest pain, palpitations and leg swelling.  Gastrointestinal: Negative for nausea, abdominal pain and diarrhea.  Neurological: Positive for weakness (a/w numbness in arms and legs u(pon rising after lying supine - last 5 min). Negative for dizziness, light-headedness and headaches.  Psychiatric/Behavioral: Negative for dysphoric mood. The patient is not nervous/anxious.   All other systems reviewed and are negative.      Objective:   Physical Exam  BP 112/84  Pulse 69  Temp(Src) 98.5 F (36.9 C) (Oral)  Ht 5\' 3"  (1.6 m)  Wt 205 lb 3.2 oz (93.078 kg)  BMI 36.36 kg/m2  SpO2 98% Wt Readings from Last 3 Encounters:  08/13/13 205 lb 3.2 oz (93.078 kg)  06/16/13 195 lb (88.451 kg)  02/16/13 198 lb (89.812 kg)   Constitutional: She is overweight, but appears well-developed and well-nourished. No distress.  HENT: Head: Normocephalic  and atraumatic. Ears: B TMs ok, no erythema or effusion; Nose: Nose normal. Mouth/Throat: Oropharynx is clear and moist. No oropharyngeal exudate.  Eyes: Conjunctivae and EOM are normal. Pupils are equal, round, and reactive to light. No scleral icterus.  Neck: Normal range of motion. Neck supple. No JVD present. No thyromegaly present.  Cardiovascular: Normal rate, regular rhythm and normal heart sounds.  No murmur heard. No BLE edema. Pulmonary/Chest: Effort normal and breath sounds normal. No respiratory distress. She has no wheezes.  Abdominal: Soft. Bowel sounds are normal. She exhibits no distension. There is no tenderness. no masses Musculoskeletal: Normal range of motion, no joint effusions. No gross deformities Neurological: She is alert and oriented to person, place, and time. No cranial nerve deficit. Coordination, balance, strength, speech and gait are normal.  Skin: Skin is warm and dry. No rash noted. No erythema.  Psychiatric: She has a normal mood and affect. Her behavior is normal. Judgment and thought content normal.    Lab Results  Component Value Date   WBC 8.2 04/13/2012   HGB 11.6* 04/13/2012   HCT 35.9* 04/13/2012   PLT 383.0 04/13/2012   GLUCOSE 91 04/13/2012   CHOL 145 04/13/2012   TRIG 78.0 04/13/2012   HDL 37.00* 04/13/2012   LDLCALC 92 04/13/2012   ALT 14 04/13/2012   AST 16 04/13/2012   NA 137 04/13/2012   K 4.0 04/13/2012  CL 101 04/13/2012   CREATININE 0.6 04/13/2012   BUN 11 04/13/2012   CO2 29 04/13/2012   TSH 0.75 04/13/2012    Dg Lumbar Spine 2-3 Views  04/13/2012   *RADIOLOGY REPORT*  Clinical Data: Low back pain.  LUMBAR SPINE - 2-3 VIEW  Comparison: None.  Findings: Normal alignment.  Disc spaces are maintained.  Early spurring anteriorly at L2-3 and L3-4.  No fracture.  No subluxation.  SI joints are symmetric and unremarkable.  Prior cholecystectomy.  IMPRESSION: No acute bony abnormality.   Original Report Authenticated By: Rolm Baptise, M.D.         Assessment & Plan:   CPX/v70.0 - Patient has been counseled on age-appropriate routine health concerns for screening and prevention. These are reviewed and up-to-date. Immunizations are up-to-date or declined. Labs ordered and reviewed.  Numbness associated with weakness, temporary, related to position change, especially twisting neck or laying supine  -no abnormal findings on her exam today, the history concerning for cervical stenosis. Check plain film and consider MRI C-spine. Education reassurance provided today  Problem List Items Addressed This Visit   Anxiety     Hx same, exac by work change ([employer] job eliminated 08/2010, now [employer] 10/2010) and family stressors (dad -cancer)- Started SSRI (sertraline) as well as prn BZ 06/2010 - intol of sertraline Changed to citalopram 08/01/10 - Then change to Wellbutrin in 2013 - doing well The current medical regimen is effective;  continue present plan and medications.      OBESITY      Weight gain reviewed Wt Readings from Last 3 Encounters:  08/13/13 205 lb 3.2 oz (93.078 kg)  06/16/13 195 lb (88.451 kg)  02/16/13 198 lb (89.812 kg)   The patient is asked to make an attempt to improve diet and exercise patterns to aid in medical management of this problem.      Other Visit Diagnoses   Routine general medical examination at a health care facility    -  Primary    Relevant Orders       Basic metabolic panel       CBC with Differential       Hepatic function panel       Lipid panel       TSH       Urinalysis, Routine w reflex microscopic    Numbness and tingling of both upper extremities while sleeping        Relevant Orders       DG Cervical Spine 2 or 3 views       MR Cervical Spine Wo Contrast

## 2013-08-13 NOTE — Patient Instructions (Addendum)
It was good to see you today.  We have reviewed your prior records including labs and tests today  Health Maintenance reviewed - Tdap updated today - all other recommended immunizations and age-appropriate screenings are up-to-date.  Test(s) ordered today. Your results will be released to MyChart (or called to you) after review, usually within 72hours after test completion. If any changes need to be made, you will be notified at that same time.  we'll make referral for MRI neck to evaluate numbness/weakness symptoms. Our office will contact you regarding appointment(s) once made.  Medications reviewed and updated, no changes recommended at this time. Refill on medication(s) as discussed today.  Work on lifestyle changes as discussed (low fat, low carb, increased protein diet; improved exercise efforts; weight loss) to control sugar, blood pressure and cholesterol levels and/or reduce risk of developing other medical problems. Look into [email or web address] or other type of food journal to assist you in this process.  Please schedule followup in 12 months for annual exam and labs, call sooner if problems.  Health Maintenance, Female A healthy lifestyle and preventative care can promote health and wellness.  Maintain regular health, dental, and eye exams.  Eat a healthy diet. Foods like vegetables, fruits, whole grains, low-fat dairy products, and lean protein foods contain the nutrients you need without too many calories. Decrease your intake of foods high in solid fats, added sugars, and salt. Get information about a proper diet from your caregiver, if necessary.  Regular physical exercise is one of the most important things you can do for your health. Most adults should get at least 150 minutes of moderate-intensity exercise (any activity that increases your heart rate and causes you to sweat) each week. In addition, most adults need muscle-strengthening exercises on 2 or more days a week.    Maintain a healthy weight. The body mass index (BMI) is a screening tool to identify possible weight problems. It provides an estimate of body fat based on height and weight. Your caregiver can help determine your BMI, and can help you achieve or maintain a healthy weight. For adults 20 years and older:  A BMI below 18.5 is considered underweight.  A BMI of 18.5 to 24.9 is normal.  A BMI of 25 to 29.9 is considered overweight.  A BMI of 30 and above is considered obese.  Maintain normal blood lipids and cholesterol by exercising and minimizing your intake of saturated fat. Eat a balanced diet with plenty of fruits and vegetables. Blood tests for lipids and cholesterol should begin at age 52 and be repeated every 5 years. If your lipid or cholesterol levels are high, you are over 50, or you are a high risk for heart disease, you may need your cholesterol levels checked more frequently.Ongoing high lipid and cholesterol levels should be treated with medicines if diet and exercise are not effective.  If you smoke, find out from your caregiver how to quit. If you do not use tobacco, do not start.  Lung cancer screening is recommended for adults aged 68 80 years who are at high risk for developing lung cancer because of a history of smoking. Yearly low-dose computed tomography (CT) is recommended for people who have at least a 30-pack-year history of smoking and are a current smoker or have quit within the past 15 years. A pack year of smoking is smoking an average of 1 pack of cigarettes a day for 1 year (for example: 1 pack a day for 30 years or 2  packs a day for 15 years). Yearly screening should continue until the smoker has stopped smoking for at least 15 years. Yearly screening should also be stopped for people who develop a health problem that would prevent them from having lung cancer treatment.  If you are pregnant, do not drink alcohol. If you are breastfeeding, be very cautious about  drinking alcohol. If you are not pregnant and choose to drink alcohol, do not exceed 1 drink per day. One drink is considered to be 12 ounces (355 mL) of beer, 5 ounces (148 mL) of wine, or 1.5 ounces (44 mL) of liquor.  Avoid use of street drugs. Do not share needles with anyone. Ask for help if you need support or instructions about stopping the use of drugs.  High blood pressure causes heart disease and increases the risk of stroke. Blood pressure should be checked at least every 1 to 2 years. Ongoing high blood pressure should be treated with medicines, if weight loss and exercise are not effective.  If you are 71 to 44 years old, ask your caregiver if you should take aspirin to prevent strokes.  Diabetes screening involves taking a blood sample to check your fasting blood sugar level. This should be done once every 3 years, after age 32, if you are within normal weight and without risk factors for diabetes. Testing should be considered at a younger age or be carried out more frequently if you are overweight and have at least 1 risk factor for diabetes.  Breast cancer screening is essential preventative care for women. You should practice "breast self-awareness." This means understanding the normal appearance and feel of your breasts and may include breast self-examination. Any changes detected, no matter how small, should be reported to a caregiver. Women in their 89s and 30s should have a clinical breast exam (CBE) by a caregiver as part of a regular health exam every 1 to 3 years. After age 60, women should have a CBE every year. Starting at age 75, women should consider having a mammogram (breast X-ray) every year. Women who have a family history of breast cancer should talk to their caregiver about genetic screening. Women at a high risk of breast cancer should talk to their caregiver about having an MRI and a mammogram every year.  Breast cancer gene (BRCA)-related cancer risk assessment is  recommended for women who have family members with BRCA-related cancers. BRCA-related cancers include breast, ovarian, tubal, and peritoneal cancers. Having family members with these cancers may be associated with an increased risk for harmful changes (mutations) in the breast cancer genes BRCA1 and BRCA2. Results of the assessment will determine the need for genetic counseling and BRCA1 and BRCA2 testing.  The Pap test is a screening test for cervical cancer. Women should have a Pap test starting at age 66. Between ages 44 and 64, Pap tests should be repeated every 2 years. Beginning at age 96, you should have a Pap test every 3 years as long as the past 3 Pap tests have been normal. If you had a hysterectomy for a problem that was not cancer or a condition that could lead to cancer, then you no longer need Pap tests. If you are between ages 10 and 62, and you have had normal Pap tests going back 10 years, you no longer need Pap tests. If you have had past treatment for cervical cancer or a condition that could lead to cancer, you need Pap tests and screening for cancer for  at least 20 years after your treatment. If Pap tests have been discontinued, risk factors (such as a new sexual partner) need to be reassessed to determine if screening should be resumed. Some women have medical problems that increase the chance of getting cervical cancer. In these cases, your caregiver may recommend more frequent screening and Pap tests.  The human papillomavirus (HPV) test is an additional test that may be used for cervical cancer screening. The HPV test looks for the virus that can cause the cell changes on the cervix. The cells collected during the Pap test can be tested for HPV. The HPV test could be used to screen women aged 81 years and older, and should be used in women of any age who have unclear Pap test results. After the age of 43, women should have HPV testing at the same frequency as a Pap test.  Colorectal  cancer can be detected and often prevented. Most routine colorectal cancer screening begins at the age of 58 and continues through age 31. However, your caregiver may recommend screening at an earlier age if you have risk factors for colon cancer. On a yearly basis, your caregiver may provide home test kits to check for hidden blood in the stool. Use of a small camera at the end of a tube, to directly examine the colon (sigmoidoscopy or colonoscopy), can detect the earliest forms of colorectal cancer. Talk to your caregiver about this at age 69, when routine screening begins. Direct examination of the colon should be repeated every 5 to 10 years through age 54, unless early forms of pre-cancerous polyps or small growths are found.  Hepatitis C blood testing is recommended for all people born from [birth year] through 1966 and any individual with known risks for hepatitis C.  Practice safe sex. Use condoms and avoid high-risk sexual practices to reduce the spread of sexually transmitted infections (STIs). Sexually active women aged 46 and younger should be checked for Chlamydia, which is a common sexually transmitted infection. Older women with new or multiple partners should also be tested for Chlamydia. Testing for other STIs is recommended if you are sexually active and at increased risk.  Osteoporosis is a disease in which the bones lose minerals and strength with aging. This can result in serious bone fractures. The risk of osteoporosis can be identified using a bone density scan. Women ages 38 and over and women at risk for fractures or osteoporosis should discuss screening with their caregivers. Ask your caregiver whether you should be taking a calcium supplement or vitamin D to reduce the rate of osteoporosis.  Menopause can be associated with physical symptoms and risks. Hormone replacement therapy is available to decrease symptoms and risks. You should talk to your caregiver about whether hormone  replacement therapy is right for you.  Use sunscreen. Apply sunscreen liberally and repeatedly throughout the day. You should seek shade when your shadow is shorter than you. Protect yourself by wearing long sleeves, pants, a wide-brimmed hat, and sunglasses year round, whenever you are outdoors.  Notify your caregiver of new moles or changes in moles, especially if there is a change in shape or color. Also notify your caregiver if a mole is larger than the size of a pencil eraser.  Stay current with your immunizations. Document Released: 09/24/2010 Document Revised: 07/06/2012 Document Reviewed: 09/24/2010 [facility] Patient Information 2014 [place].

## 2013-08-13 NOTE — Assessment & Plan Note (Signed)
Hx same, exac by work change ([employer] job eliminated 08/2010, now [employer] 10/2010) and family stressors (dad -cancer)- Started SSRI (sertraline) as well as prn BZ 06/2010 - intol of sertraline Changed to citalopram 08/01/10 - Then change to Wellbutrin in 2013 - doing well The current medical regimen is effective;  continue present plan and medications.

## 2013-08-13 NOTE — Assessment & Plan Note (Signed)
Weight gain reviewed Wt Readings from Last 3 Encounters:  08/13/13 205 lb 3.2 oz (93.078 kg)  06/16/13 195 lb (88.451 kg)  02/16/13 198 lb (89.812 kg)   The patient is asked to make an attempt to improve diet and exercise patterns to aid in medical management of this problem.

## 2013-08-27 ENCOUNTER — Ambulatory Visit
Admission: RE | Admit: 2013-08-27 | Discharge: 2013-08-27 | Disposition: A | Discharge status: Home / Self Care | Payer: 59 | Source: Ambulatory Visit | Attending: Internal Medicine | Admitting: Internal Medicine

## 2013-08-27 DIAGNOSIS — R200 Anesthesia of skin: Secondary | ICD-10-CM

## 2013-08-27 DIAGNOSIS — R202 Paresthesia of skin: Principal | ICD-10-CM

## 2013-09-01 ENCOUNTER — Encounter: Payer: Self-pay | Admitting: Internal Medicine

## 2013-09-21 ENCOUNTER — Other Ambulatory Visit: Payer: Self-pay | Admitting: Internal Medicine

## 2013-10-11 ENCOUNTER — Ambulatory Visit (INDEPENDENT_AMBULATORY_CARE_PROVIDER_SITE_OTHER): Payer: 59 | Admitting: *Deleted

## 2013-10-11 DIAGNOSIS — M779 Enthesopathy, unspecified: Principal | ICD-10-CM

## 2013-10-11 DIAGNOSIS — M778 Other enthesopathies, not elsewhere classified: Secondary | ICD-10-CM

## 2013-10-11 DIAGNOSIS — M7750 Other enthesopathy of unspecified foot: Secondary | ICD-10-CM

## 2013-10-11 NOTE — Progress Notes (Signed)
   Subjective:    Patient ID: Donna Robbins, female    DOB: 1970-09-26, 43 y.o.   MRN: [id number]  HPI  PUO AND GIVEN INSTRUCTION.  Review of Systems     Objective:   Physical Exam        Assessment & Plan:

## 2013-10-11 NOTE — Patient Instructions (Signed)

## 2013-11-23 ENCOUNTER — Ambulatory Visit: Payer: 59

## 2014-04-18 ENCOUNTER — Other Ambulatory Visit: Payer: Self-pay | Admitting: Internal Medicine

## 2014-04-20 NOTE — Telephone Encounter (Signed)
Faxed script back to [facility]...Johny Chess

## 2014-04-20 NOTE — Telephone Encounter (Signed)
Please advise on this [facility] refill. Thanks

## 2014-04-20 NOTE — Telephone Encounter (Signed)
Has not filled in some time. Will allow #15 no refills. Have printed and signed.

## 2014-05-27 ENCOUNTER — Other Ambulatory Visit: Payer: Self-pay | Admitting: Dermatology

## 2014-05-27 ENCOUNTER — Encounter: Payer: Self-pay | Admitting: Internal Medicine

## 2014-08-18 ENCOUNTER — Encounter: Payer: Self-pay | Admitting: Internal Medicine

## 2014-08-31 ENCOUNTER — Encounter: Payer: Self-pay | Admitting: Internal Medicine

## 2014-09-19 ENCOUNTER — Other Ambulatory Visit: Payer: Self-pay | Admitting: Internal Medicine

## 2014-09-19 MED ORDER — CLONAZEPAM 0.5 MG PO TABS: 0.2500 mg | ORAL_TABLET | Freq: Two times a day (BID) | ORAL | Status: DC | PRN

## 2014-11-07 ENCOUNTER — Ambulatory Visit: Payer: 59 | Admitting: Internal Medicine

## 2014-11-09 ENCOUNTER — Other Ambulatory Visit: Payer: Self-pay | Admitting: Internal Medicine

## 2014-11-17 ENCOUNTER — Telehealth: Payer: Self-pay | Admitting: Internal Medicine

## 2014-11-17 NOTE — Telephone Encounter (Signed)
Sent pt my chart message with advisement.

## 2014-11-17 NOTE — Telephone Encounter (Signed)
Pt left a message on mychart appt request "As a [employer] employee, my flu shot was done in October of 2015 at my office. It will be done at my office, again, in October of 2016. I think that's [facility] preference. Do you need information from the shot to make a note in my chart?"  Please advise

## 2014-11-24 ENCOUNTER — Ambulatory Visit (INDEPENDENT_AMBULATORY_CARE_PROVIDER_SITE_OTHER): Payer: 59 | Admitting: Internal Medicine

## 2014-11-24 ENCOUNTER — Other Ambulatory Visit (INDEPENDENT_AMBULATORY_CARE_PROVIDER_SITE_OTHER): Payer: 59

## 2014-11-24 ENCOUNTER — Encounter: Payer: Self-pay | Admitting: Internal Medicine

## 2014-11-24 VITALS — BP 102/78 | HR 63 | Temp 98.0°F | Resp 12 | Ht 63.0 in | Wt 158.8 lb

## 2014-11-24 DIAGNOSIS — Z23 Encounter for immunization: Secondary | ICD-10-CM | POA: Diagnosis not present

## 2014-11-24 DIAGNOSIS — E663 Overweight: Secondary | ICD-10-CM

## 2014-11-24 DIAGNOSIS — Z0000 Encounter for general adult medical examination without abnormal findings: Secondary | ICD-10-CM | POA: Diagnosis not present

## 2014-11-24 DIAGNOSIS — F3340 Major depressive disorder, recurrent, in remission, unspecified: Secondary | ICD-10-CM

## 2014-11-24 LAB — COMPREHENSIVE METABOLIC PANEL
ALBUMIN: 4.1 g/dL (ref 3.5–5.2)
ALK PHOS: 49 U/L (ref 39–117)
ALT: 11 U/L (ref 0–35)
AST: 14 U/L (ref 0–37)
BUN: 12 mg/dL (ref 6–23)
CHLORIDE: 105 meq/L (ref 96–112)
CO2: 29 mEq/L (ref 19–32)
Calcium: 9.2 mg/dL (ref 8.4–10.5)
Creatinine, Ser: 0.59 mg/dL (ref 0.40–1.20)
GFR: 117.05 mL/min (ref >60.00)
Glucose, Bld: 75 mg/dL (ref 70–99)
POTASSIUM: 4.6 meq/L (ref 3.5–5.1)
Sodium: 140 mEq/L (ref 135–145)
Total Bilirubin: 0.4 mg/dL (ref 0.2–1.2)
Total Protein: 7.1 g/dL (ref 6.0–8.3)

## 2014-11-24 LAB — CBC
HCT: 39.3 % (ref 36.0–46.0)
Hemoglobin: 12.9 g/dL (ref 12.0–15.0)
MCHC: 32.8 g/dL (ref 30.0–36.0)
MCV: 94.4 fl (ref 78.0–100.0)
Platelets: 299 10*3/uL (ref 150.0–400.0)
RBC: 4.16 Mil/uL (ref 3.87–5.11)
RDW: 13.4 % (ref 11.5–15.5)
WBC: 7.2 10*3/uL (ref 4.0–10.5)

## 2014-11-24 LAB — LIPID PANEL
CHOLESTEROL: 147 mg/dL (ref 0–200)
HDL: 53 mg/dL (ref >39.00)
LDL CALC: 86 mg/dL (ref 0–99)
NonHDL: 93.78
Total CHOL/HDL Ratio: 3
Triglycerides: 40 mg/dL (ref 0.0–149.0)
VLDL: 8 mg/dL (ref 0.0–40.0)

## 2014-11-24 LAB — HEMOGLOBIN A1C: HEMOGLOBIN A1C: 5.2 % (ref 4.6–6.5)

## 2014-11-24 NOTE — Progress Notes (Signed)
   Subjective:    Patient ID: Donna Robbins, female    DOB: 1970/08/26, 44 y.o.   MRN: [id number]  HPI The patient is a 44 YO female coming in for wellness. No new concerns.   PMH, FHM, social history reviewed and updated at visit.   Review of Systems  Constitutional: Negative for fever, activity change, appetite change, fatigue and unexpected weight change.  HENT: Negative.   Eyes: Negative.   Respiratory: Negative for cough, chest tightness, shortness of breath and wheezing.   Cardiovascular: Negative for chest pain, palpitations and leg swelling.  Gastrointestinal: Negative for nausea, abdominal pain, diarrhea, constipation and abdominal distention.  Musculoskeletal: Negative.   Skin: Negative.   Neurological: Negative.   Psychiatric/Behavioral: Negative.       Objective:   Physical Exam  Constitutional: She is oriented to person, place, and time. She appears well-developed and well-nourished.  HENT:  Head: Normocephalic and atraumatic.  Eyes: EOM are normal.  Neck: Normal range of motion.  Cardiovascular: Normal rate and regular rhythm.   Pulmonary/Chest: Effort normal and breath sounds normal. No respiratory distress. She has no wheezes. She has no rales.  Abdominal: Soft. Bowel sounds are normal. She exhibits no distension. There is no tenderness. There is no rebound.  Musculoskeletal: She exhibits no edema.  Neurological: She is alert and oriented to person, place, and time. Coordination normal.  Skin: Skin is warm and dry.  Psychiatric: She has a normal mood and affect.   Filed Vitals:   11/24/14 0942  BP: 102/78  Pulse: 63  Temp: 98 F (36.7 C)  TempSrc: Oral  Resp: 12  Height: 5\' 3"  (1.6 m)  Weight: 158 lb 12.8 oz (72.031 kg)  SpO2: 99%      Assessment & Plan:  Flu shot given at visit.

## 2014-11-24 NOTE — Patient Instructions (Signed)
We have given you the flu shot today and will check your labs.   The next big cancer screening is at 45 and is the colon cancer screening.  Health Maintenance Adopting a healthy lifestyle and getting preventive care can go a long way to promote health and wellness. Talk with your health care provider about what schedule of regular examinations is right for you. This is a good chance for you to check in with your provider about disease prevention and staying healthy. In between checkups, there are plenty of things you can do on your own. Experts have done a lot of research about which lifestyle changes and preventive measures are most likely to keep you healthy. Ask your health care provider for more information. WEIGHT AND DIET  Eat a healthy diet  Be sure to include plenty of vegetables, fruits, low-fat dairy products, and lean protein.  Do not eat a lot of foods high in solid fats, added sugars, or salt.  Get regular exercise. This is one of the most important things you can do for your health.  Most adults should exercise for at least 150 minutes each week. The exercise should increase your heart rate and make you sweat (moderate-intensity exercise).  Most adults should also do strengthening exercises at least twice a week. This is in addition to the moderate-intensity exercise.  Maintain a healthy weight  Body mass index (BMI) is a measurement that can be used to identify possible weight problems. It estimates body fat based on height and weight. Your health care provider can help determine your BMI and help you achieve or maintain a healthy weight.  For females 45 years of age and older:   A BMI below 18.5 is considered underweight.  A BMI of 18.5 to 24.9 is normal.  A BMI of 25 to 29.9 is considered overweight.  A BMI of 30 and above is considered obese.  Watch levels of cholesterol and blood lipids  You should start having your blood tested for lipids and cholesterol at 45  years of age, then have this test every 5 years.  You may need to have your cholesterol levels checked more often if:  Your lipid or cholesterol levels are high.  You are older than 45 years of age.  You are at high risk for heart disease.  CANCER SCREENING   Lung Cancer  Lung cancer screening is recommended for adults 45-40 years old who are at high risk for lung cancer because of a history of smoking.  A yearly low-dose CT scan of the lungs is recommended for people who:  Currently smoke.  Have quit within the past 15 years.  Have at least a 30-pack-year history of smoking. A pack year is smoking an average of one pack of cigarettes a day for 1 year.  Yearly screening should continue until it has been 15 years since you quit.  Yearly screening should stop if you develop a health problem that would prevent you from having lung cancer treatment.  Breast Cancer  Practice breast self-awareness. This means understanding how your breasts normally appear and feel.  It also means doing regular breast self-exams. Let your health care provider know about any changes, no matter how small.  If you are in your 20s or 30s, you should have a clinical breast exam (CBE) by a health care provider every 1-3 years as part of a regular health exam.  If you are 17 or older, have a CBE every year. Also consider  having a breast X-ray (mammogram) every year.  If you have a family history of breast cancer, talk to your health care provider about genetic screening.  If you are at high risk for breast cancer, talk to your health care provider about having an MRI and a mammogram every year.  Breast cancer gene (BRCA) assessment is recommended for women who have family members with BRCA-related cancers. BRCA-related cancers include:  Breast.  Ovarian.  Tubal.  Peritoneal cancers.  Results of the assessment will determine the need for genetic counseling and BRCA1 and BRCA2 testing. Cervical  Cancer Routine pelvic examinations to screen for cervical cancer are no longer recommended for nonpregnant women who are considered low risk for cancer of the pelvic organs (ovaries, uterus, and vagina) and who do not have symptoms. A pelvic examination may be necessary if you have symptoms including those associated with pelvic infections. Ask your health care provider if a screening pelvic exam is right for you.   The Pap test is the screening test for cervical cancer for women who are considered at risk.  If you had a hysterectomy for a problem that was not cancer or a condition that could lead to cancer, then you no longer need Pap tests.  If you are older than 65 years, and you have had normal Pap tests for the past 10 years, you no longer need to have Pap tests.  If you have had past treatment for cervical cancer or a condition that could lead to cancer, you need Pap tests and screening for cancer for at least 20 years after your treatment.  If you no longer get a Pap test, assess your risk factors if they change (such as having a new sexual partner). This can affect whether you should start being screened again.  Some women have medical problems that increase their chance of getting cervical cancer. If this is the case for you, your health care provider may recommend more frequent screening and Pap tests.  The human papillomavirus (HPV) test is another test that may be used for cervical cancer screening. The HPV test looks for the virus that can cause cell changes in the cervix. The cells collected during the Pap test can be tested for HPV.  The HPV test can be used to screen women 75 years of age and older. Getting tested for HPV can extend the interval between normal Pap tests from three to five years.  An HPV test also should be used to screen women of any age who have unclear Pap test results.  After 45 years of age, women should have HPV testing as often as Pap tests.  Colorectal  Cancer  This type of cancer can be detected and often prevented.  Routine colorectal cancer screening usually begins at 45 years of age and continues through 45 years of age.  Your health care provider may recommend screening at an earlier age if you have risk factors for colon cancer.  Your health care provider may also recommend using home test kits to check for hidden blood in the stool.  A small camera at the end of a tube can be used to examine your colon directly (sigmoidoscopy or colonoscopy). This is done to check for the earliest forms of colorectal cancer.  Routine screening usually begins at age 24.  Direct examination of the colon should be repeated every 5-10 years through 45 years of age. However, you may need to be screened more often if early forms of precancerous  polyps or small growths are found. Skin Cancer  Check your skin from head to toe regularly.  Tell your health care provider about any new moles or changes in moles, especially if there is a change in a mole's shape or color.  Also tell your health care provider if you have a mole that is larger than the size of a pencil eraser.  Always use sunscreen. Apply sunscreen liberally and repeatedly throughout the day.  Protect yourself by wearing long sleeves, pants, a wide-brimmed hat, and sunglasses whenever you are outside. HEART DISEASE, DIABETES, AND HIGH BLOOD PRESSURE   Have your blood pressure checked at least every 1-2 years. High blood pressure causes heart disease and increases the risk of stroke.  If you are between 42 years and 41 years old, ask your health care provider if you should take aspirin to prevent strokes.  Have regular diabetes screenings. This involves taking a blood sample to check your fasting blood sugar level.  If you are at a normal weight and have a low risk for diabetes, have this test once every three years after 45 years of age.  If you are overweight and have a high risk for  diabetes, consider being tested at a younger age or more often. PREVENTING INFECTION  Hepatitis B  If you have a higher risk for hepatitis B, you should be screened for this virus. You are considered at high risk for hepatitis B if:  You were born in a country where hepatitis B is common. Ask your health care provider which countries are considered high risk.  Your parents were born in a high-risk country, and you have not been immunized against hepatitis B (hepatitis B vaccine).  You have HIV or AIDS.  You use needles to inject street drugs.  You live with someone who has hepatitis B.  You have had sex with someone who has hepatitis B.  You get hemodialysis treatment.  You take certain medicines for conditions, including cancer, organ transplantation, and autoimmune conditions. Hepatitis C  Blood testing is recommended for:  Everyone born from [birth year] through 1966.  Anyone with known risk factors for hepatitis C. Sexually transmitted infections (STIs)  You should be screened for sexually transmitted infections (STIs) including gonorrhea and chlamydia if:  You are sexually active and are younger than 45 years of age.  You are older than 45 years of age and your health care provider tells you that you are at risk for this type of infection.  Your sexual activity has changed since you were last screened and you are at an increased risk for chlamydia or gonorrhea. Ask your health care provider if you are at risk.  If you do not have HIV, but are at risk, it may be recommended that you take a prescription medicine daily to prevent HIV infection. This is called pre-exposure prophylaxis (PrEP). You are considered at risk if:  You are sexually active and do not regularly use condoms or know the HIV status of your partner(s).  You take drugs by injection.  You are sexually active with a partner who has HIV. Talk with your health care provider about whether you are at high risk of  being infected with HIV. If you choose to begin PrEP, you should first be tested for HIV. You should then be tested every 3 months for as long as you are taking PrEP.  PREGNANCY   If you are premenopausal and you may become pregnant, ask your health care provider  about preconception counseling.  If you may become pregnant, take 400 to 800 micrograms (mcg) of folic acid every day.  If you want to prevent pregnancy, talk to your health care provider about birth control (contraception). OSTEOPOROSIS AND MENOPAUSE   Osteoporosis is a disease in which the bones lose minerals and strength with aging. This can result in serious bone fractures. Your risk for osteoporosis can be identified using a bone density scan.  If you are 14 years of age or older, or if you are at risk for osteoporosis and fractures, ask your health care provider if you should be screened.  Ask your health care provider whether you should take a calcium or vitamin D supplement to lower your risk for osteoporosis.  Menopause may have certain physical symptoms and risks.  Hormone replacement therapy may reduce some of these symptoms and risks. Talk to your health care provider about whether hormone replacement therapy is right for you.  HOME CARE INSTRUCTIONS   Schedule regular health, dental, and eye exams.  Stay current with your immunizations.   Do not use any tobacco products including cigarettes, chewing tobacco, or electronic cigarettes.  If you are pregnant, do not drink alcohol.  If you are breastfeeding, limit how much and how often you drink alcohol.  Limit alcohol intake to no more than 1 drink per day for nonpregnant women. One drink equals 12 ounces of beer, 5 ounces of wine, or 1 ounces of hard liquor.  Do not use street drugs.  Do not share needles.  Ask your health care provider for help if you need support or information about quitting drugs.  Tell your health care provider if you often feel  depressed.  Tell your health care provider if you have ever been abused or do not feel safe at home. Document Released: 09/24/2010 Document Revised: 07/26/2013 Document Reviewed: 02/10/2013 [facility] Patient Information 2015 [place], [place]. This information is not intended to replace advice given to you by your health care provider. Make sure you discuss any questions you have with your health care provider.

## 2014-11-24 NOTE — Assessment & Plan Note (Signed)
Given flu shot at visit, up to date on other health screening. Talked to her about colonoscopy as next health screening.

## 2014-11-24 NOTE — Assessment & Plan Note (Signed)
Takes wellbutrin and is symptom free at this time.

## 2014-11-24 NOTE — Progress Notes (Signed)
Pre visit review using our clinic review tool, if applicable. No additional management support is needed unless otherwise documented below in the visit note. 

## 2014-11-24 NOTE — Assessment & Plan Note (Signed)
Has lost weight since last year and congratulated her on that and encouraged her to maintain. Good exercise and healthier diet.

## 2015-03-21 ENCOUNTER — Telehealth: Payer: Self-pay | Admitting: Internal Medicine

## 2015-03-22 MED ORDER — CLONAZEPAM 0.5 MG PO TABS: 0.2500 mg | ORAL_TABLET | Freq: Two times a day (BID) | ORAL | Status: DC | PRN

## 2015-03-22 NOTE — Telephone Encounter (Signed)
Sent to pharmacy 

## 2015-03-22 NOTE — Addendum Note (Signed)
Addended by: Pricilla Holm A on: 03/22/2015 07:51 AM   Modules accepted: Orders

## 2015-05-08 MED FILL — BUPROPION HCL XL 150 MG TAB: 150 | 90 days supply | Qty: 90 | Fill #2

## 2015-06-09 DIAGNOSIS — Z1231 Encounter for screening mammogram for malignant neoplasm of breast: Secondary | ICD-10-CM | POA: Diagnosis not present

## 2015-06-09 LAB — HM MAMMOGRAPHY

## 2015-06-19 DIAGNOSIS — M79641 Pain in right hand: Secondary | ICD-10-CM | POA: Diagnosis not present

## 2015-06-19 MED FILL — DICLOFENAC SODIUM 1% GEL: 1 | 25 days supply | Qty: 100 | Fill #0

## 2015-06-27 DIAGNOSIS — D2262 Melanocytic nevi of left upper limb, including shoulder: Secondary | ICD-10-CM | POA: Diagnosis not present

## 2015-06-27 DIAGNOSIS — D2261 Melanocytic nevi of right upper limb, including shoulder: Secondary | ICD-10-CM | POA: Diagnosis not present

## 2015-06-27 DIAGNOSIS — L812 Freckles: Secondary | ICD-10-CM | POA: Diagnosis not present

## 2015-06-27 DIAGNOSIS — D2272 Melanocytic nevi of left lower limb, including hip: Secondary | ICD-10-CM | POA: Diagnosis not present

## 2015-06-27 DIAGNOSIS — D2271 Melanocytic nevi of right lower limb, including hip: Secondary | ICD-10-CM | POA: Diagnosis not present

## 2015-06-27 DIAGNOSIS — D1801 Hemangioma of skin and subcutaneous tissue: Secondary | ICD-10-CM | POA: Diagnosis not present

## 2015-06-27 DIAGNOSIS — D225 Melanocytic nevi of trunk: Secondary | ICD-10-CM | POA: Diagnosis not present

## 2015-06-29 ENCOUNTER — Encounter: Payer: Self-pay | Admitting: Geriatric Medicine

## 2015-08-14 MED FILL — BUPROPION HCL XL 150 MG TAB: 150 | 90 days supply | Qty: 90 | Fill #3

## 2015-10-31 DIAGNOSIS — H5213 Myopia, bilateral: Secondary | ICD-10-CM | POA: Diagnosis not present

## 2015-10-31 DIAGNOSIS — H40013 Open angle with borderline findings, low risk, bilateral: Secondary | ICD-10-CM | POA: Diagnosis not present

## 2015-11-15 ENCOUNTER — Ambulatory Visit (INDEPENDENT_AMBULATORY_CARE_PROVIDER_SITE_OTHER): Payer: 59 | Admitting: Internal Medicine

## 2015-11-15 ENCOUNTER — Encounter: Payer: Self-pay | Admitting: Internal Medicine

## 2015-11-15 DIAGNOSIS — K439 Ventral hernia without obstruction or gangrene: Secondary | ICD-10-CM | POA: Diagnosis not present

## 2015-11-15 NOTE — Assessment & Plan Note (Addendum)
Offered CT scan to determine contents but she is not interested due to only mild symptoms. Talked about avoiding straining and weight gain to avoid growing the area. No strangulation or gangrene on exam. Differential also includes fat necrosis which could be causing her same symptoms.

## 2015-11-15 NOTE — Patient Instructions (Signed)
Hernia, Adult A hernia is the bulging of an organ or tissue through a weak spot in the muscles of the abdomen (abdominal wall). Hernias develop most often near the navel or groin. There are many kinds of hernias. Common kinds include:  Femoral hernia. This kind of hernia develops under the groin in the upper thigh area.  Inguinal hernia. This kind of hernia develops in the groin or scrotum.  Umbilical hernia. This kind of hernia develops near the navel.  Hiatal hernia. This kind of hernia causes part of the stomach to be pushed up into the chest.  Incisional hernia. This kind of hernia bulges through a scar from an abdominal surgery. CAUSES This condition may be caused by:  Heavy lifting.  Coughing over a long period of time.  Straining to have a bowel movement.  An incision made during an abdominal surgery.  A birth defect (congenital defect).  Excess weight or obesity.  Smoking.  Poor nutrition.  Cystic fibrosis.  Excess fluid in the abdomen.  Undescended testicles. SYMPTOMS Symptoms of a hernia include:  A lump on the abdomen. This is the first sign of a hernia. The lump may become more obvious with standing, straining, or coughing. It may get bigger over time if it is not treated or if the condition causing it is not treated.  Pain. A hernia is usually painless, but it may become painful over time if treatment is delayed. The pain is usually dull and may get worse with standing or lifting heavy objects. Sometimes a hernia gets tightly squeezed in the weak spot (strangulated) or stuck there (incarcerated) and causes additional symptoms. These symptoms may include:  Vomiting.  Nausea.  Constipation.  Irritability. DIAGNOSIS A hernia may be diagnosed with:  A physical exam. During the exam your health care provider may ask you to cough or to make a specific movement, because a hernia is usually more visible when you move.  Imaging tests. These can  include:  X-rays.  Ultrasound.  CT scan. TREATMENT A hernia that is small and painless may not need to be treated. A hernia that is large or painful may be treated with surgery. Inguinal hernias may be treated with surgery to prevent incarceration or strangulation. Strangulated hernias are always treated with surgery, because lack of blood to the trapped organ or tissue can cause it to die. Surgery to treat a hernia involves pushing the bulge back into place and repairing the weak part of the abdomen. HOME CARE INSTRUCTIONS  Avoid straining.  Do not lift anything heavier than 10 lb (4.5 kg).  Lift with your leg muscles, not your back muscles. This helps avoid strain.  When coughing, try to cough gently.  Prevent constipation. Constipation leads to straining with bowel movements, which can make a hernia worse or cause a hernia repair to break down. You can prevent constipation by:  Eating a high-fiber diet that includes plenty of fruits and vegetables.  Drinking enough fluids to keep your urine clear or pale yellow. Aim to drink 6-8 glasses of water per day.  Using a stool softener as directed by your health care provider.  Lose weight, if you are overweight.  Do not use any tobacco products, including cigarettes, chewing tobacco, or electronic cigarettes. If you need help quitting, ask your health care provider.  Keep all follow-up visits as directed by your health care provider. This is important. Your health care provider may need to monitor your condition. SEEK MEDICAL CARE IF:  You have   swelling, redness, and pain in the affected area.  Your bowel habits change. SEEK IMMEDIATE MEDICAL CARE IF:  You have a fever.  You have abdominal pain that is getting worse.  You feel nauseous or you vomit.  You cannot push the hernia back in place by gently pressing on it while you are lying down.  The hernia:  Changes in shape or size.  Is stuck outside the  abdomen.  Becomes discolored.  Feels hard or tender.   This information is not intended to replace advice given to you by your health care provider. Make sure you discuss any questions you have with your health care provider.   Document Released: 03/11/2005 Document Revised: 04/01/2014 Document Reviewed: 01/19/2014 Elsevier Interactive Patient Education 2016 [employer]  

## 2015-11-15 NOTE — Progress Notes (Signed)
Pre visit review using our clinic review tool, if applicable. No additional management support is needed unless otherwise documented below in the visit note. 

## 2015-11-15 NOTE — Progress Notes (Signed)
   Subjective:    Patient ID: Donna Robbins, female    DOB: 09-22-1970, 45 y.o.   MRN: [id number]  HPI The patient is a 45 YO female coming in for a hard lump of tissue under her c-section scar for about 1 year. Sometimes it hurts but not usually. It comes and goes but now is changing colors and this concerned her. Previously has been noted to have a right groin hernia but this is in a different spot. This area increases with straining. She has gained about 10-20 pounds in the last year and she has noticed it growing some.   Review of Systems  Constitutional: Negative for activity change, appetite change, fatigue, fever and unexpected weight change.  Respiratory: Negative for cough, chest tightness, shortness of breath and wheezing.   Cardiovascular: Negative for chest pain, palpitations and leg swelling.  Gastrointestinal: Positive for abdominal distention. Negative for abdominal pain, constipation, diarrhea and nausea.  Musculoskeletal: Positive for myalgias. Negative for arthralgias, back pain and gait problem.  Skin: Positive for color change.      Objective:   Physical Exam  Constitutional: She is oriented to person, place, and time. She appears well-developed and well-nourished.  HENT:  Head: Normocephalic and atraumatic.  Eyes: EOM are normal.  Neck: Normal range of motion.  Cardiovascular: Normal rate and regular rhythm.   Pulmonary/Chest: Effort normal. No respiratory distress. She has no wheezes. She has no rales.  Abdominal: Soft. She exhibits no distension. There is no tenderness. There is no rebound.  Low horizontal c-section scar with a .5 cm circular ventral hernia with some darkening of the overlying skin versus some fat necrosis. No bulging with cough in the office.   Musculoskeletal: She exhibits no edema.  Neurological: She is alert and oriented to person, place, and time. Coordination normal.  Skin: Skin is warm and dry.  Psychiatric: She has a normal mood and  affect.   Vitals:   11/15/15 0807  BP: 102/78  Pulse: 67  Resp: 12  Temp: 98.1 F (36.7 C)  TempSrc: Oral  SpO2: 95%  Weight: 184 lb (83.5 kg)  Height: 5\' 3"  (1.6 m)      Assessment & Plan:

## 2015-12-13 ENCOUNTER — Other Ambulatory Visit: Payer: Self-pay | Admitting: Internal Medicine

## 2015-12-14 ENCOUNTER — Encounter: Payer: Self-pay | Admitting: Internal Medicine

## 2015-12-14 ENCOUNTER — Other Ambulatory Visit: Payer: Self-pay | Admitting: Internal Medicine

## 2015-12-14 DIAGNOSIS — L0102 Bockhart's impetigo: Secondary | ICD-10-CM | POA: Diagnosis not present

## 2015-12-14 DIAGNOSIS — D485 Neoplasm of uncertain behavior of skin: Secondary | ICD-10-CM | POA: Diagnosis not present

## 2015-12-14 DIAGNOSIS — L738 Other specified follicular disorders: Secondary | ICD-10-CM | POA: Diagnosis not present

## 2015-12-14 MED ORDER — CLONAZEPAM 0.5 MG PO TABS: 0.2500 mg | ORAL_TABLET | Freq: Two times a day (BID) | ORAL | 0 refills | Status: DC | PRN

## 2015-12-14 NOTE — Telephone Encounter (Signed)
Sent to pharmacy 

## 2015-12-15 ENCOUNTER — Encounter: Payer: Self-pay | Admitting: Internal Medicine

## 2015-12-15 MED FILL — clonazePAM 0.5 MG TABS: 0.5 | 15 days supply | Qty: 30 | Fill #0

## 2015-12-15 NOTE — Telephone Encounter (Signed)
Please call pharmacy and if they do not have clonazepam call in verbally.

## 2016-01-01 DIAGNOSIS — Z6830 Body mass index (BMI) 30.0-30.9, adult: Secondary | ICD-10-CM | POA: Diagnosis not present

## 2016-01-01 DIAGNOSIS — Z1151 Encounter for screening for human papillomavirus (HPV): Secondary | ICD-10-CM | POA: Diagnosis not present

## 2016-01-01 DIAGNOSIS — Z01419 Encounter for gynecological examination (general) (routine) without abnormal findings: Secondary | ICD-10-CM | POA: Diagnosis not present

## 2016-01-08 ENCOUNTER — Ambulatory Visit (INDEPENDENT_AMBULATORY_CARE_PROVIDER_SITE_OTHER): Payer: 59 | Admitting: Internal Medicine

## 2016-01-08 ENCOUNTER — Encounter: Payer: Self-pay | Admitting: Internal Medicine

## 2016-01-08 ENCOUNTER — Other Ambulatory Visit (INDEPENDENT_AMBULATORY_CARE_PROVIDER_SITE_OTHER): Payer: 59

## 2016-01-08 VITALS — BP 112/64 | HR 60 | Temp 98.4°F | Resp 14 | Ht 63.0 in | Wt 172.0 lb

## 2016-01-08 DIAGNOSIS — Z0000 Encounter for general adult medical examination without abnormal findings: Secondary | ICD-10-CM

## 2016-01-08 DIAGNOSIS — F3340 Major depressive disorder, recurrent, in remission, unspecified: Secondary | ICD-10-CM | POA: Diagnosis not present

## 2016-01-08 DIAGNOSIS — E669 Obesity, unspecified: Secondary | ICD-10-CM

## 2016-01-08 DIAGNOSIS — Z6830 Body mass index (BMI) 30.0-30.9, adult: Secondary | ICD-10-CM | POA: Diagnosis not present

## 2016-01-08 LAB — CBC
HEMATOCRIT: 40.1 % (ref 36.0–46.0)
HEMOGLOBIN: 13.5 g/dL (ref 12.0–15.0)
MCHC: 33.8 g/dL (ref 30.0–36.0)
MCV: 94.6 fl (ref 78.0–100.0)
PLATELETS: 271 10*3/uL (ref 150.0–400.0)
RBC: 4.23 Mil/uL (ref 3.87–5.11)
RDW: 13.3 % (ref 11.5–15.5)
WBC: 7.1 10*3/uL (ref 4.0–10.5)

## 2016-01-08 LAB — LIPID PANEL
CHOLESTEROL: 117 mg/dL (ref 0–200)
HDL: 41 mg/dL (ref >39.00)
LDL Cholesterol: 64 mg/dL (ref 0–99)
NonHDL: 75.57
TRIGLYCERIDES: 60 mg/dL (ref 0.0–149.0)
Total CHOL/HDL Ratio: 3
VLDL: 12 mg/dL (ref 0.0–40.0)

## 2016-01-08 LAB — COMPREHENSIVE METABOLIC PANEL
ALK PHOS: 34 U/L — AB (ref 39–117)
ALT: 20 U/L (ref 0–35)
AST: 19 U/L (ref 0–37)
Albumin: 4.4 g/dL (ref 3.5–5.2)
BILIRUBIN TOTAL: 0.7 mg/dL (ref 0.2–1.2)
BUN: 6 mg/dL (ref 6–23)
CALCIUM: 9.4 mg/dL (ref 8.4–10.5)
CO2: 29 mEq/L (ref 19–32)
Chloride: 103 mEq/L (ref 96–112)
Creatinine, Ser: 0.64 mg/dL (ref 0.40–1.20)
GFR: 106.03 mL/min (ref >60.00)
GLUCOSE: 87 mg/dL (ref 70–99)
POTASSIUM: 3.9 meq/L (ref 3.5–5.1)
Sodium: 140 mEq/L (ref 135–145)
TOTAL PROTEIN: 7.4 g/dL (ref 6.0–8.3)

## 2016-01-08 LAB — VITAMIN D 25 HYDROXY (VIT D DEFICIENCY, FRACTURES): VITD: 27.36 ng/mL — ABNORMAL LOW (ref 30.00–100.00)

## 2016-01-08 NOTE — Assessment & Plan Note (Signed)
BMI up slightly from last year with more exercise but also more depressive eating stuff. She is working on that.

## 2016-01-08 NOTE — Progress Notes (Signed)
   Subjective:    Patient ID: Donna Robbins, female    DOB: [birth year], 45 y.o.   MRN: [id number]  HPI The patient is a 45 YO female coming in for wellness. No new concerns.  PMH, [facility], social history reviewed and updated.   Review of Systems  Constitutional: Negative.   HENT: Negative.   Eyes: Negative.   Respiratory: Negative.   Cardiovascular: Negative.   Gastrointestinal: Negative.   Musculoskeletal: Negative.   Skin: Negative.   Neurological: Negative.   Psychiatric/Behavioral: Positive for dysphoric mood. Negative for agitation, decreased concentration, self-injury, sleep disturbance and suicidal ideas. The patient is nervous/anxious. The patient is not hyperactive.       Objective:   Physical Exam  Constitutional: She is oriented to person, place, and time. She appears well-developed and well-nourished.  HENT:  Head: Normocephalic and atraumatic.  Eyes: EOM are normal.  Neck: Normal range of motion.  Cardiovascular: Normal rate and regular rhythm.   Pulmonary/Chest: Effort normal and breath sounds normal. No respiratory distress. She has no wheezes. She has no rales.  Abdominal: Soft. Bowel sounds are normal. She exhibits no distension. There is no tenderness. There is no rebound.  Musculoskeletal: She exhibits no edema.  Neurological: She is alert and oriented to person, place, and time. Coordination normal.  Skin: Skin is warm and dry.  Psychiatric: She has a normal mood and affect.   Vitals:   01/08/16 0916  BP: 112/64  Pulse: 60  Resp: 14  Temp: 98.4 F (36.9 C)  TempSrc: Oral  SpO2: 98%  Weight: 172 lb (78 kg)  Height: 5\' 3"  (1.6 m)      Assessment & Plan:

## 2016-01-08 NOTE — Assessment & Plan Note (Signed)
Checking labs, flu shot already done this year. Counseled about exercise as part of a health lifestyle and dangers of distracted driving. Colonoscopy due at 20, Mammogram discussed. Declines need for HIV screening. Given screening recommendations.

## 2016-01-08 NOTE — Patient Instructions (Signed)
We have given you information about the bright light therapy.   If you get to the point where you are using the clonazepam even on the weekends we may need to try something different.   Health Maintenance, Female Adopting a healthy lifestyle and getting preventive care can go a long way to promote health and wellness. Talk with your health care provider about what schedule of regular examinations is right for you. This is a good chance for you to check in with your provider about disease prevention and staying healthy. In between checkups, there are plenty of things you can do on your own. Experts have done a lot of research about which lifestyle changes and preventive measures are most likely to keep you healthy. Ask your health care provider for more information. WEIGHT AND DIET  Eat a healthy diet  Be sure to include plenty of vegetables, fruits, low-fat dairy products, and lean protein.  Do not eat a lot of foods high in solid fats, added sugars, or salt.  Get regular exercise. This is one of the most important things you can do for your health.  Most adults should exercise for at least 150 minutes each week. The exercise should increase your heart rate and make you sweat (moderate-intensity exercise).  Most adults should also do strengthening exercises at least twice a week. This is in addition to the moderate-intensity exercise.  Maintain a healthy weight  Body mass index (BMI) is a measurement that can be used to identify possible weight problems. It estimates body fat based on height and weight. Your health care provider can help determine your BMI and help you achieve or maintain a healthy weight.  For females 67 years of age and older:   A BMI below 18.5 is considered underweight.  A BMI of 18.5 to 24.9 is normal.  A BMI of 25 to 29.9 is considered overweight.  A BMI of 30 and above is considered obese.  Watch levels of cholesterol and blood lipids  You should start  having your blood tested for lipids and cholesterol at 46 years of age, then have this test every 5 years.  You may need to have your cholesterol levels checked more often if:  Your lipid or cholesterol levels are high.  You are older than 45 years of age.  You are at high risk for heart disease.  CANCER SCREENING   Lung Cancer  Lung cancer screening is recommended for adults 68-70 years old who are at high risk for lung cancer because of a history of smoking.  A yearly low-dose CT scan of the lungs is recommended for people who:  Currently smoke.  Have quit within the past 15 years.  Have at least a 30-pack-year history of smoking. A pack year is smoking an average of one pack of cigarettes a day for 1 year.  Yearly screening should continue until it has been 15 years since you quit.  Yearly screening should stop if you develop a health problem that would prevent you from having lung cancer treatment.  Breast Cancer  Practice breast self-awareness. This means understanding how your breasts normally appear and feel.  It also means doing regular breast self-exams. Let your health care provider know about any changes, no matter how small.  If you are in your 20s or 30s, you should have a clinical breast exam (CBE) by a health care provider every 1-3 years as part of a regular health exam.  If you are 40 or  older, have a CBE every year. Also consider having a breast X-ray (mammogram) every year.  If you have a family history of breast cancer, talk to your health care provider about genetic screening.  If you are at high risk for breast cancer, talk to your health care provider about having an MRI and a mammogram every year.  Breast cancer gene (BRCA) assessment is recommended for women who have family members with BRCA-related cancers. BRCA-related cancers include:  Breast.  Ovarian.  Tubal.  Peritoneal cancers.  Results of the assessment will determine the need for  genetic counseling and BRCA1 and BRCA2 testing. Cervical Cancer Your health care provider may recommend that you be screened regularly for cancer of the pelvic organs (ovaries, uterus, and vagina). This screening involves a pelvic examination, including checking for microscopic changes to the surface of your cervix (Pap test). You may be encouraged to have this screening done every 3 years, beginning at age 62.  For women ages 85-65, health care providers may recommend pelvic exams and Pap testing every 3 years, or they may recommend the Pap and pelvic exam, combined with testing for human papilloma virus (HPV), every 5 years. Some types of HPV increase your risk of cervical cancer. Testing for HPV may also be done on women of any age with unclear Pap test results.  Other health care providers may not recommend any screening for nonpregnant women who are considered low risk for pelvic cancer and who do not have symptoms. Ask your health care provider if a screening pelvic exam is right for you.  If you have had past treatment for cervical cancer or a condition that could lead to cancer, you need Pap tests and screening for cancer for at least 20 years after your treatment. If Pap tests have been discontinued, your risk factors (such as having a new sexual partner) need to be reassessed to determine if screening should resume. Some women have medical problems that increase the chance of getting cervical cancer. In these cases, your health care provider may recommend more frequent screening and Pap tests. Colorectal Cancer  This type of cancer can be detected and often prevented.  Routine colorectal cancer screening usually begins at 46 years of age and continues through 46 years of age.  Your health care provider may recommend screening at an earlier age if you have risk factors for colon cancer.  Your health care provider may also recommend using home test kits to check for hidden blood in the  stool.  A small camera at the end of a tube can be used to examine your colon directly (sigmoidoscopy or colonoscopy). This is done to check for the earliest forms of colorectal cancer.  Routine screening usually begins at age 76.  Direct examination of the colon should be repeated every 5-10 years through 46 years of age. However, you may need to be screened more often if early forms of precancerous polyps or small growths are found. Skin Cancer  Check your skin from head to toe regularly.  Tell your health care provider about any new moles or changes in moles, especially if there is a change in a mole's shape or color.  Also tell your health care provider if you have a mole that is larger than the size of a pencil eraser.  Always use sunscreen. Apply sunscreen liberally and repeatedly throughout the day.  Protect yourself by wearing long sleeves, pants, a wide-brimmed hat, and sunglasses whenever you are outside. HEART DISEASE, DIABETES,  AND HIGH BLOOD PRESSURE   High blood pressure causes heart disease and increases the risk of stroke. High blood pressure is more likely to develop in:  People who have blood pressure in the high end of the normal range (130-139/85-89 mm Hg).  People who are overweight or obese.  People who are African American.  If you are 18-39 years of age, have your blood pressure checked every 3-5 years. If you are 40 years of age or older, have your blood pressure checked every year. You should have your blood pressure measured twice--once when you are at a hospital or clinic, and once when you are not at a hospital or clinic. Record the average of the two measurements. To check your blood pressure when you are not at a hospital or clinic, you can use:  An automated blood pressure machine at a pharmacy.  A home blood pressure monitor.  If you are between 55 years and 79 years old, ask your health care provider if you should take aspirin to prevent  strokes.  Have regular diabetes screenings. This involves taking a blood sample to check your fasting blood sugar level.  If you are at a normal weight and have a low risk for diabetes, have this test once every three years after 45 years of age.  If you are overweight and have a high risk for diabetes, consider being tested at a younger age or more often. PREVENTING INFECTION  Hepatitis B  If you have a higher risk for hepatitis B, you should be screened for this virus. You are considered at high risk for hepatitis B if:  You were born in a country where hepatitis B is common. Ask your health care provider which countries are considered high risk.  Your parents were born in a high-risk country, and you have not been immunized against hepatitis B (hepatitis B vaccine).  You have HIV or AIDS.  You use needles to inject street drugs.  You live with someone who has hepatitis B.  You have had sex with someone who has hepatitis B.  You get hemodialysis treatment.  You take certain medicines for conditions, including cancer, organ transplantation, and autoimmune conditions. Hepatitis C  Blood testing is recommended for:  Everyone born from 1945 through 1965.  Anyone with known risk factors for hepatitis C. Sexually transmitted infections (STIs)  You should be screened for sexually transmitted infections (STIs) including gonorrhea and chlamydia if:  You are sexually active and are younger than 46 years of age.  You are older than 46 years of age and your health care provider tells you that you are at risk for this type of infection.  Your sexual activity has changed since you were last screened and you are at an increased risk for chlamydia or gonorrhea. Ask your health care provider if you are at risk.  If you do not have HIV, but are at risk, it may be recommended that you take a prescription medicine daily to prevent HIV infection. This is called pre-exposure prophylaxis  (PrEP). You are considered at risk if:  You are sexually active and do not regularly use condoms or know the HIV status of your partner(s).  You take drugs by injection.  You are sexually active with a partner who has HIV. Talk with your health care provider about whether you are at high risk of being infected with HIV. If you choose to begin PrEP, you should first be tested for HIV. You should then be   tested every 3 months for as long as you are taking PrEP.  PREGNANCY   If you are premenopausal and you may become pregnant, ask your health care provider about preconception counseling.  If you may become pregnant, take 400 to 800 micrograms (mcg) of folic acid every day.  If you want to prevent pregnancy, talk to your health care provider about birth control (contraception). OSTEOPOROSIS AND MENOPAUSE   Osteoporosis is a disease in which the bones lose minerals and strength with aging. This can result in serious bone fractures. Your risk for osteoporosis can be identified using a bone density scan.  If you are 43 years of age or older, or if you are at risk for osteoporosis and fractures, ask your health care provider if you should be screened.  Ask your health care provider whether you should take a calcium or vitamin D supplement to lower your risk for osteoporosis.  Menopause may have certain physical symptoms and risks.  Hormone replacement therapy may reduce some of these symptoms and risks. Talk to your health care provider about whether hormone replacement therapy is right for you.  HOME CARE INSTRUCTIONS   Schedule regular health, dental, and eye exams.  Stay current with your immunizations.   Do not use any tobacco products including cigarettes, chewing tobacco, or electronic cigarettes.  If you are pregnant, do not drink alcohol.  If you are breastfeeding, limit how much and how often you drink alcohol.  Limit alcohol intake to no more than 1 drink per day for  nonpregnant women. One drink equals 12 ounces of beer, 5 ounces of wine, or 1 ounces of hard liquor.  Do not use street drugs.  Do not share needles.  Ask your health care provider for help if you need support or information about quitting drugs.  Tell your health care provider if you often feel depressed.  Tell your health care provider if you have ever been abused or do not feel safe at home.   This information is not intended to replace advice given to you by your health care provider. Make sure you discuss any questions you have with your health care provider.   Document Released: 09/24/2010 Document Revised: 04/01/2014 Document Reviewed: 02/10/2013 Elsevier Interactive Patient Education [employer].

## 2016-01-08 NOTE — Assessment & Plan Note (Signed)
She is off her wellbutrin as it was making her somewhat anxious. She is having some more depressive symptoms and does not wish to go on medication. Talked to her about bright light therapy and given information.

## 2016-01-08 NOTE — Progress Notes (Signed)
Pre visit review using our clinic review tool, if applicable. No additional management support is needed unless otherwise documented below in the visit note. 

## 2016-01-15 ENCOUNTER — Encounter: Payer: Self-pay | Admitting: Internal Medicine

## 2016-01-15 DIAGNOSIS — K644 Residual hemorrhoidal skin tags: Secondary | ICD-10-CM

## 2016-01-31 ENCOUNTER — Ambulatory Visit: Payer: Self-pay | Admitting: General Surgery

## 2016-01-31 DIAGNOSIS — K644 Residual hemorrhoidal skin tags: Secondary | ICD-10-CM | POA: Diagnosis not present

## 2016-02-18 ENCOUNTER — Other Ambulatory Visit: Payer: Self-pay | Admitting: Internal Medicine

## 2016-02-20 MED ORDER — CLONAZEPAM 0.5 MG PO TABS: 0.2500 mg | ORAL_TABLET | Freq: Two times a day (BID) | ORAL | 0 refills | Status: DC | PRN

## 2016-02-20 MED FILL — clonazePAM 0.5 MG TABS: 0.5 | 15 days supply | Qty: 30 | Fill #0

## 2016-04-13 ENCOUNTER — Encounter (HOSPITAL_COMMUNITY): Payer: Self-pay | Admitting: Emergency Medicine

## 2016-04-13 ENCOUNTER — Ambulatory Visit (HOSPITAL_COMMUNITY)
Admission: EM | Admit: 2016-04-13 | Discharge: 2016-04-13 | Disposition: A | Discharge status: Home / Self Care | Payer: Self-pay | Attending: Family Medicine | Admitting: Family Medicine

## 2016-04-13 ENCOUNTER — Ambulatory Visit (INDEPENDENT_AMBULATORY_CARE_PROVIDER_SITE_OTHER): Payer: Self-pay

## 2016-04-13 ENCOUNTER — Ambulatory Visit (INDEPENDENT_AMBULATORY_CARE_PROVIDER_SITE_OTHER): Payer: 59

## 2016-04-13 DIAGNOSIS — S8992XA Unspecified injury of left lower leg, initial encounter: Secondary | ICD-10-CM | POA: Diagnosis not present

## 2016-04-13 DIAGNOSIS — R079 Chest pain, unspecified: Secondary | ICD-10-CM | POA: Diagnosis not present

## 2016-04-13 DIAGNOSIS — S8001XA Contusion of right knee, initial encounter: Secondary | ICD-10-CM

## 2016-04-13 DIAGNOSIS — S2220XA Unspecified fracture of sternum, initial encounter for closed fracture: Secondary | ICD-10-CM

## 2016-04-13 DIAGNOSIS — M7989 Other specified soft tissue disorders: Secondary | ICD-10-CM | POA: Diagnosis not present

## 2016-04-13 MED ORDER — TRAMADOL HCL 50 MG PO TABS: 50.0000 mg | ORAL_TABLET | Freq: Four times a day (QID) | ORAL | 0 refills | Status: DC | PRN

## 2016-04-13 MED ORDER — HYDROCODONE-ACETAMINOPHEN 5-325 MG PO TABS: 1.0000 | ORAL_TABLET | Freq: Four times a day (QID) | ORAL | 0 refills | Status: DC | PRN

## 2016-04-13 NOTE — Discharge Instructions (Signed)
EXAM: CHEST  2 VIEW   COMPARISON:  None.   FINDINGS: The cardiomediastinal silhouette is within normal limits. The lungs are well inflated and clear. There is no evidence of pleural effusion or pneumothorax. There is slight cortical irregularity involving the mid body of the sternum for which a a nondisplaced fracture is not excluded.   IMPRESSION: 1. Slight irregularity of the body of the sternum. Nondisplaced fracture not excluded. 2. No evidence of acute cardiopulmonary process.     Electronically Signed   By: Logan Bores M.D.   On: 04/13/2016 18:03
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
## 2016-04-25 ENCOUNTER — Encounter: Payer: Self-pay | Admitting: Internal Medicine

## 2016-05-08 ENCOUNTER — Other Ambulatory Visit (INDEPENDENT_AMBULATORY_CARE_PROVIDER_SITE_OTHER): Payer: 59

## 2016-05-08 ENCOUNTER — Encounter: Payer: Self-pay | Admitting: Internal Medicine

## 2016-05-08 ENCOUNTER — Ambulatory Visit (INDEPENDENT_AMBULATORY_CARE_PROVIDER_SITE_OTHER): Payer: 59 | Admitting: Internal Medicine

## 2016-05-08 VITALS — BP 110/60 | HR 68 | Temp 98.4°F | Ht 63.0 in | Wt 138.0 lb

## 2016-05-08 DIAGNOSIS — R634 Abnormal weight loss: Secondary | ICD-10-CM

## 2016-05-08 LAB — COMPREHENSIVE METABOLIC PANEL
ALBUMIN: 4.5 g/dL (ref 3.5–5.2)
ALK PHOS: 41 U/L (ref 39–117)
ALT: 11 U/L (ref 0–35)
AST: 14 U/L (ref 0–37)
BUN: 7 mg/dL (ref 6–23)
CHLORIDE: 104 meq/L (ref 96–112)
CO2: 30 mEq/L (ref 19–32)
Calcium: 9.5 mg/dL (ref 8.4–10.5)
Creatinine, Ser: 0.63 mg/dL (ref 0.40–1.20)
GFR: 107.82 mL/min (ref >60.00)
Glucose, Bld: 92 mg/dL (ref 70–99)
Potassium: 4.5 mEq/L (ref 3.5–5.1)
Sodium: 140 mEq/L (ref 135–145)
TOTAL PROTEIN: 7.1 g/dL (ref 6.0–8.3)
Total Bilirubin: 0.5 mg/dL (ref 0.2–1.2)

## 2016-05-08 LAB — HEMOGLOBIN A1C: HEMOGLOBIN A1C: 5.1 % (ref 4.6–6.5)

## 2016-05-08 LAB — TSH: TSH: 0.94 u[IU]/mL (ref 0.35–4.50)

## 2016-05-08 LAB — T4, FREE: FREE T4: 1.01 ng/dL (ref 0.60–1.60)

## 2016-05-08 LAB — CBC
HCT: 38.7 % (ref 36.0–46.0)
HEMOGLOBIN: 13 g/dL (ref 12.0–15.0)
MCHC: 33.5 g/dL (ref 30.0–36.0)
MCV: 96.3 fl (ref 78.0–100.0)
PLATELETS: 332 10*3/uL (ref 150.0–400.0)
RBC: 4.02 Mil/uL (ref 3.87–5.11)
RDW: 13.4 % (ref 11.5–15.5)
WBC: 4.2 10*3/uL (ref 4.0–10.5)

## 2016-05-08 LAB — VITAMIN D 25 HYDROXY (VIT D DEFICIENCY, FRACTURES): VITD: 25.76 ng/mL — AB (ref 30.00–100.00)

## 2016-05-08 NOTE — Assessment & Plan Note (Signed)
Checking labs for metabolic causes today with thyroid, kidneys, liver, blood counts, vitamin D. She is up to date on cancer screenings. Will ask her to increase calories and if still losing weight will refer to GI for evaluation given she has not had colonoscopy ever with her ibs.

## 2016-05-08 NOTE — Progress Notes (Signed)
Pre visit review using our clinic review tool, if applicable. No additional management support is needed unless otherwise documented below in the visit note. 

## 2016-05-08 NOTE — Patient Instructions (Signed)
We will check the labs today and get back to you on mychart.
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
## 2016-05-15 ENCOUNTER — Other Ambulatory Visit: Payer: Self-pay | Admitting: Internal Medicine

## 2016-05-15 MED ORDER — CLONAZEPAM 0.5 MG PO TABS: 0.2500 mg | ORAL_TABLET | Freq: Two times a day (BID) | ORAL | 0 refills | Status: DC | PRN

## 2016-05-15 MED FILL — clonazePAM 0.5 MG TABS: 0.5 | 15 days supply | Qty: 30 | Fill #0

## 2016-05-15 NOTE — Telephone Encounter (Signed)
Script faxed to [facility]...Donna Robbins

## 2016-05-24 ENCOUNTER — Encounter: Payer: Self-pay | Admitting: Internal Medicine

## 2016-05-31 ENCOUNTER — Encounter: Payer: Self-pay | Admitting: Internal Medicine

## 2016-05-31 ENCOUNTER — Ambulatory Visit (INDEPENDENT_AMBULATORY_CARE_PROVIDER_SITE_OTHER): Payer: 59 | Admitting: Internal Medicine

## 2016-05-31 VITALS — BP 94/60 | HR 84 | Ht 63.0 in | Wt 140.4 lb

## 2016-05-31 DIAGNOSIS — R634 Abnormal weight loss: Secondary | ICD-10-CM | POA: Diagnosis not present

## 2016-05-31 DIAGNOSIS — R194 Change in bowel habit: Secondary | ICD-10-CM | POA: Diagnosis not present

## 2016-05-31 NOTE — Patient Instructions (Signed)
  You have been scheduled for a colonoscopy. Please follow written instructions given to you at your visit today.  Please pick up your prep supplies at the pharmacy. If you use inhalers (even only as needed), please bring them with you on the day of your procedure. Your physician has requested that you go to www.[email or web address].[email or web address] and enter the access code given to you at your visit today. This web site gives a general overview about your procedure. However, you should still follow specific instructions given to you by our office regarding your preparation for the procedure.     I appreciate the opportunity to care for you. Carl Gessner, MD, FACG 

## 2016-05-31 NOTE — Progress Notes (Signed)
   Donna Robbins 46 y.o. 12-26-1970 [id number] Referred by Hoyt Koch, MD [place], [place] [id number]  Assessment & Plan:   Encounter Diagnoses  Name Primary?  . Change in bowel habits Yes  . Loss of weight     Evaluate change in bowel habits with colonoscopy. Could be from her IBS but given overall situation we decided to do colonoscopy. The risks and benefits as well as alternatives of endoscopic procedure(s) have been discussed and reviewed. All questions answered. The patient agrees to proceed.  I think weight loss most likely due to successful diet changes in setting of stopping ssri and bupropion which probably had a role in prior weight status as she had been on them x yrs. Recent labs - CBC, TSH, chemistries all ok.  I appreciate the opportunity to care for this patient. MA:[id number] Becky Augusta, MD   Subjective:   Chief Complaint: weight loss and change in bowel habits  HPI Married 46 yo ww [name] problems seen in past - but not for several yrs - 2012. She successfully stopped citalopram and bupropion in Summer 2017. Also changed diet and tried to lose weight which she did. Reduced carbs, increased exercise. In the past she could lose weight when tried diet change but more difficult and not lasting. Wt Readings from Last 3 Encounters:  05/31/16 140 lb 6 oz (63.7 kg)  05/08/16 138 lb (62.6 kg)  01/08/16 172 lb (78 kg)   Along the way she began to have copious mucous with stools. No bleeding. No prior colonoscopy GI ROS o/w neg  Medications, allergies, past medical history, past surgical history, family history and social history are reviewed and updated in the EMR.  Review of Systems Some anxiety All other ROS neg or per HPI  Objective:   Physical Exam @BP  94/60   Pulse 84   Ht 5\' 3"  (1.6 m)   Wt 140 lb 6 oz (63.7 kg)   LMP 05/01/2016 (Exact Date)   BMI 24.87 kg/m @  General:  Well-developed, well-nourished and in no acute  distress Eyes:  anicteric. ENT:   Mouth and posterior pharynx free of lesions.  Neck:   supple w/o thyromegaly or mass.  Lungs: Clear to auscultation bilaterally. Heart:  S1S2, no rubs, murmurs, gallops. Abdomen:  soft, non-tender, no hepatosplenomegaly, hernia, or mass and BS+.  Rectal: deferred Lymph:  no cervical or supraclavicular adenopathy. Extremities:   no edema, cyanosis or clubbing Skin   no rash. Neuro:  A&O x 3.  Psych:  appropriate mood and  Affect.   Data Reviewed: see HPI

## 2016-06-03 ENCOUNTER — Encounter (INDEPENDENT_AMBULATORY_CARE_PROVIDER_SITE_OTHER): Payer: Self-pay

## 2016-06-03 DIAGNOSIS — I868 Varicose veins of other specified sites: Secondary | ICD-10-CM

## 2016-06-10 DIAGNOSIS — Z1231 Encounter for screening mammogram for malignant neoplasm of breast: Secondary | ICD-10-CM | POA: Diagnosis not present

## 2016-06-17 ENCOUNTER — Ambulatory Visit (INDEPENDENT_AMBULATORY_CARE_PROVIDER_SITE_OTHER): Payer: 59 | Admitting: Internal Medicine

## 2016-06-17 ENCOUNTER — Encounter: Payer: Self-pay | Admitting: Internal Medicine

## 2016-06-17 ENCOUNTER — Telehealth: Payer: Self-pay | Admitting: Internal Medicine

## 2016-06-17 VITALS — BP 114/70 | HR 70 | Temp 98.4°F | Ht 63.0 in | Wt 142.0 lb

## 2016-06-17 DIAGNOSIS — H8110 Benign paroxysmal vertigo, unspecified ear: Secondary | ICD-10-CM

## 2016-06-17 NOTE — Telephone Encounter (Signed)
Patient Name: Donna Robbins DOB: 08/15/1970 Initial Comment Caller states had brief episode yesterday, doing sit up and got severe vertigo; it passed; began again last evening and hasn't gone away; vision is spinning; Nurse Assessment Nurse: Jimmye Norman, RN, [place] Date/Time (Eastern Time): 06/17/2016 10:11:21 AM Confirm and document reason for call. If symptomatic, describe symptoms. ---Caller states had brief episode yesterday, going to sit up and got severe vertigo; it passed; began again last evening and hasn't gone away. Does the patient have any new or worsening symptoms? ---Yes Will a triage be completed? ---Yes Related visit to physician within the last 2 weeks? ---No Does the PT have any chronic conditions? (i.e. diabetes, asthma, etc.) ---Yes List chronic conditions. ---anxiety Is the patient pregnant or possibly pregnant? (Ask all females between the ages of 6-55) ---No Is this a behavioral health or substance abuse call? ---No Guidelines Guideline Title Affirmed Question Affirmed Notes Dizziness - Vertigo [1] MODERATE dizziness (e.g., vertigo; feels very unsteady, interferes with normal activities) AND [2] has NOT been evaluated by physician for this Final Disposition User See Physician within 24 Hours [place], [place], [place] Comments No appt available with PCP or at primary office today. Caller declined appt for tomorrow. Apt scheduled for today at 1:45pm with Dr. Regis Bill at [place]. Referrals GO TO FACILITY OTHER - SPECIFY Disagree/Comply: Comply

## 2016-06-17 NOTE — Patient Instructions (Signed)
Refer to pt for vestibular rehab that may help  Your vertigo  Your exam is reassuring.     Benign Positional Vertigo Vertigo is the feeling that you or your surroundings are moving when they are not. Benign positional vertigo is the most common form of vertigo. The cause of this condition is not serious (is benign). This condition is triggered by certain movements and positions (is positional). This condition can be dangerous if it occurs while you are doing something that could endanger you or others, such as driving. What are the causes? In many cases, the cause of this condition is not known. It may be caused by a disturbance in an area of the inner ear that helps your brain to sense movement and balance. This disturbance can be caused by a viral infection (labyrinthitis), head injury, or repetitive motion. What increases the risk? This condition is more likely to develop in:  Women.  People who are 47 years of age or older. What are the signs or symptoms? Symptoms of this condition usually happen when you move your head or your eyes in different directions. Symptoms may start suddenly, and they usually last for less than a minute. Symptoms may include:  Loss of balance and falling.  Feeling like you are spinning or moving.  Feeling like your surroundings are spinning or moving.  Nausea and vomiting.  Blurred vision.  Dizziness.  Involuntary eye movement (nystagmus). Symptoms can be mild and cause only slight annoyance, or they can be severe and interfere with daily life. Episodes of benign positional vertigo may return (recur) over time, and they may be triggered by certain movements. Symptoms may improve over time. How is this diagnosed? This condition is usually diagnosed by medical history and a physical exam of the head, neck, and ears. You may be referred to a health care provider who specializes in ear, nose, and throat (ENT) problems (otolaryngologist) or a provider who  specializes in disorders of the nervous system (neurologist). You may have additional testing, including:  MRI.  A CT scan.  Eye movement tests. Your health care provider may ask you to change positions quickly while he or she watches you for symptoms of benign positional vertigo, such as nystagmus. Eye movement may be tested with an electronystagmogram (ENG), caloric stimulation, the Dix-Hallpike test, or the roll test.  An electroencephalogram (EEG). This records electrical activity in your brain.  Hearing tests. How is this treated? Usually, your health care provider will treat this by moving your head in specific positions to adjust your inner ear back to normal. Surgery may be needed in severe cases, but this is rare. In some cases, benign positional vertigo may resolve on its own in 2-4 weeks. Follow these instructions at home: Safety   Move slowly.Avoid sudden body or head movements.  Avoid driving.  Avoid operating heavy machinery.  Avoid doing any tasks that would be dangerous to you or others if a vertigo episode would occur.  If you have trouble walking or keeping your balance, try using a cane for stability. If you feel dizzy or unstable, sit down right away.  Return to your normal activities as told by your health care provider. Ask your health care provider what activities are safe for you. General instructions   Take over-the-counter and prescription medicines only as told by your health care provider.  Avoid certain positions or movements as told by your health care provider.  Drink enough fluid to keep your urine clear or pale  yellow.  Keep all follow-up visits as told by your health care provider. This is important. Contact a health care provider if:  You have a fever.  Your condition gets worse or you develop new symptoms.  Your family or friends notice any behavioral changes.  Your nausea or vomiting gets worse.  You have numbness or a "pins and  needles" sensation. Get help right away if:  You have difficulty speaking or moving.  You are always dizzy.  You faint.  You develop severe headaches.  You have weakness in your legs or arms.  You have changes in your hearing or vision.  You develop a stiff neck.  You develop sensitivity to light. This information is not intended to replace advice given to you by your health care provider. Make sure you discuss any questions you have with your health care provider. Document Released: 12/17/2005 Document Revised: 08/17/2015 Document Reviewed: 07/04/2014 Elsevier Interactive Patient Education  2017 [employer].

## 2016-06-17 NOTE — Progress Notes (Signed)
Chief Complaint  Patient presents with  . Dizziness    HPI: Donna Robbins 46 y.o.  sda   PCP [facility] appt NA  Seen  2 14  PCP for weight loss and Gi dr Carlean Purl 3 9 for change in bowel habits  Last check up was 10 2017  And fine  Husband drove her today .    called team healt for transient  Sx of dizziness    Describes positional spinning   Better if lays o n left side but  Worse recenetly   Treadmill   Dizziness  And then sit ups  Had some transient dizziness's  When on back .   Hx of  similar situation  When going to bed and passes.     Then last pm laying on back in  Sons  bed and  Onset persisted   Spinning   Better on left side  And slept and felt better and then head movement and was  Lot worse.    Had nausea  Sea sick   Lay on left side  And drank gallon of water and now up and down than side to side .   No vision    Hearing .   No change no uri sinus sx .  ROS: See pertinent positives and negatives per HPI. No cp sob  Fever  Falling unusual headaches .   Past Medical History:  Diagnosis Date  . Allergy   . Anxiety   . GERD (gastroesophageal reflux disease)   . HEARING LOSS   . Irritable bowel syndrome   . OBESITY     Family History  Problem Relation Age of Onset  . Cancer Father     Angio sarcoma  . Hyperlipidemia Father   . Diabetes Mother   . Hyperlipidemia Mother   . Arthritis Maternal Grandmother   . Lung cancer Maternal Grandmother   . Arthritis    . Lung cancer Maternal Grandfather   . Colon cancer Neg Hx   . Stomach cancer Neg Hx   . Rectal cancer Neg Hx   . Esophageal cancer Neg Hx   . Liver cancer Neg Hx     Social History   Social History  . Marital status: Married    Spouse name: N/A  . Number of children: 2  . Years of education: N/A   Occupational History  . [employer]   Social History Main Topics  . Smoking status: Never Smoker  . Smokeless tobacco: Never Used  . Alcohol use Yes    Comment: Rarely  . Drug use: No  . Sexual activity: Not Asked   Other Topics Concern  . None   Social History Narrative  . None    Outpatient Medications Prior to Visit  Medication Sig Dispense Refill  . cholecalciferol (VITAMIN D) 1000 units tablet Take 5,000 Units by mouth daily.     . clonazePAM (KLONOPIN) 0.5 MG tablet Take 0.5-1 tablets (0.25-0.5 mg total) by mouth 2 (two) times daily as needed. for anxiety 30 tablet 0  . ferrous sulfate 325 (65 FE) MG EC tablet Take 325 mg by mouth daily.     . Multiple Vitamin (MULTIVITAMIN) capsule Take 1 capsule by mouth daily.     No facility-administered medications prior to visit.      EXAM:  BP 114/70 (BP Location: Left Arm, Patient Position: Sitting, Cuff Size: Normal)   Pulse 70   Temp 98.4 F (  36.9 C) (Oral)   Ht 5\' 3"  (1.6 m)   Wt 142 lb (64.4 kg)   BMI 25.15 kg/m   Body mass index is 25.15 kg/m.  GENERAL: vitals reviewed and listed above, alert, oriented, appears well hydrated and in no acute distress non tpoxic and says feels better  HEENT: atraumatic, conjunctiva  clear, no obvious abnormalities on inspection of external nose and ears  tmss clear 2+ soft wax right ear OP : no lesion edema or exudate  Tongue midline  NECK: no obvious masses on inspection palpation  LUNGS: clear to auscultation bilaterally, no wheezes, rales or rhonchi, good air movement CV: HRRR, no clubbing cyanosis or  peripheral edema nl cap refill  Abdomen:  Sof,t normal bowel sounds without hepatosplenomegaly, no guarding rebound or masses no CVA tenderness MS: moves all extremities without noticeable focal  abnormality PSYCH: pleasant and cooperative, no obvious depression or anxiety NEURO: oriented x 3 CN 3-12 appear intact. No focal muscle weakness or atrophy. . Gait WNL.  Grossly non focal. No tremor or abnormal movement. Neg rhonbergh  Nl heel toe   No poistinal nystagmus but  Did not do severe motional  Elicitation .     Lab Results    Component Value Date   WBC 4.2 05/08/2016   HGB 13.0 05/08/2016   HCT 38.7 05/08/2016   PLT 332.0 05/08/2016   GLUCOSE 92 05/08/2016   CHOL 117 01/08/2016   TRIG 60.0 01/08/2016   HDL 41.00 01/08/2016   LDLCALC 64 01/08/2016   ALT 11 05/08/2016   AST 14 05/08/2016   NA 140 05/08/2016   K 4.5 05/08/2016   CL 104 05/08/2016   CREATININE 0.63 05/08/2016   BUN 7 05/08/2016   CO2 30 05/08/2016   TSH 0.94 05/08/2016   HGBA1C 5.1 05/08/2016   Wt Readings from Last 3 Encounters:  06/17/16 142 lb (64.4 kg)  05/31/16 140 lb 6 oz (63.7 kg)  05/08/16 138 lb (62.6 kg)   BP Readings from Last 3 Encounters:  06/17/16 114/70  05/31/16 94/60  05/08/16 110/60    ASSESSMENT AND PLAN:  Discussed the following assessment and plan:  Postural vertigo, unspecified laterality - Plan: Ambulatory referral to Physical Therapy  Benign paroxysmal positional vertigo, unspecified laterality Last ekg 2014 ok saw dr Lovena Le  For palpitaitions felt to be not clinically sig nificant for HD  Appears to be benign porcess and positional  perhipoheral vertigo  And no other obv factors   Disc referral for PT   Rehab .  Pt agrees  Caution in interim until gone  -Patient advised to return or notify health care team  if symptoms worsen ,persist or new concerns arise.  Patient Instructions  Refer to pt for vestibular rehab that may help  Your vertigo  Your exam is reassuring.     Benign Positional Vertigo Vertigo is the feeling that you or your surroundings are moving when they are not. Benign positional vertigo is the most common form of vertigo. The cause of this condition is not serious (is benign). This condition is triggered by certain movements and positions (is positional). This condition can be dangerous if it occurs while you are doing something that could endanger you or others, such as driving. What are the causes? In many cases, the cause of this condition is not known. It may be caused by a  disturbance in an area of the inner ear that helps your brain to sense movement and balance. This disturbance can be  caused by a viral infection (labyrinthitis), head injury, or repetitive motion. What increases the risk? This condition is more likely to develop in:  Women.  People who are 63 years of age or older. What are the signs or symptoms? Symptoms of this condition usually happen when you move your head or your eyes in different directions. Symptoms may start suddenly, and they usually last for less than a minute. Symptoms may include:  Loss of balance and falling.  Feeling like you are spinning or moving.  Feeling like your surroundings are spinning or moving.  Nausea and vomiting.  Blurred vision.  Dizziness.  Involuntary eye movement (nystagmus). Symptoms can be mild and cause only slight annoyance, or they can be severe and interfere with daily life. Episodes of benign positional vertigo may return (recur) over time, and they may be triggered by certain movements. Symptoms may improve over time. How is this diagnosed? This condition is usually diagnosed by medical history and a physical exam of the head, neck, and ears. You may be referred to a health care provider who specializes in ear, nose, and throat (ENT) problems (otolaryngologist) or a provider who specializes in disorders of the nervous system (neurologist). You may have additional testing, including:  MRI.  A CT scan.  Eye movement tests. Your health care provider may ask you to change positions quickly while he or she watches you for symptoms of benign positional vertigo, such as nystagmus. Eye movement may be tested with an electronystagmogram (ENG), caloric stimulation, the Dix-Hallpike test, or the roll test.  An electroencephalogram (EEG). This records electrical activity in your brain.  Hearing tests. How is this treated? Usually, your health care provider will treat this by moving your head in  specific positions to adjust your inner ear back to normal. Surgery may be needed in severe cases, but this is rare. In some cases, benign positional vertigo may resolve on its own in 2-4 weeks. Follow these instructions at home: Safety   Move slowly.Avoid sudden body or head movements.  Avoid driving.  Avoid operating heavy machinery.  Avoid doing any tasks that would be dangerous to you or others if a vertigo episode would occur.  If you have trouble walking or keeping your balance, try using a cane for stability. If you feel dizzy or unstable, sit down right away.  Return to your normal activities as told by your health care provider. Ask your health care provider what activities are safe for you. General instructions   Take over-the-counter and prescription medicines only as told by your health care provider.  Avoid certain positions or movements as told by your health care provider.  Drink enough fluid to keep your urine clear or pale yellow.  Keep all follow-up visits as told by your health care provider. This is important. Contact a health care provider if:  You have a fever.  Your condition gets worse or you develop new symptoms.  Your family or friends notice any behavioral changes.  Your nausea or vomiting gets worse.  You have numbness or a "pins and needles" sensation. Get help right away if:  You have difficulty speaking or moving.  You are always dizzy.  You faint.  You develop severe headaches.  You have weakness in your legs or arms.  You have changes in your hearing or vision.  You develop a stiff neck.  You develop sensitivity to light. This information is not intended to replace advice given to you by your health care provider.  Make sure you discuss any questions you have with your health care provider. Document Released: 12/17/2005 Document Revised: 08/17/2015 Document Reviewed: 07/04/2014 Elsevier Interactive Patient Education  2017 [employer]. Panosh M.D.

## 2016-06-27 ENCOUNTER — Encounter: Payer: Self-pay | Admitting: Internal Medicine

## 2016-07-02 ENCOUNTER — Telehealth: Payer: Self-pay | Admitting: Internal Medicine

## 2016-07-02 NOTE — Telephone Encounter (Signed)
Re-did the instructions and Tnia will come pick them up Thurs.

## 2016-07-10 ENCOUNTER — Encounter: Payer: Self-pay | Admitting: Internal Medicine

## 2016-07-11 ENCOUNTER — Encounter: Payer: 59 | Admitting: Internal Medicine

## 2016-07-12 ENCOUNTER — Encounter: Payer: Self-pay | Admitting: Internal Medicine

## 2016-07-12 ENCOUNTER — Ambulatory Visit (AMBULATORY_SURGERY_CENTER): Payer: 59 | Admitting: Internal Medicine

## 2016-07-12 VITALS — BP 111/60 | HR 81 | Temp 98.0°F | Resp 12 | Ht 63.0 in | Wt 142.0 lb

## 2016-07-12 DIAGNOSIS — R194 Change in bowel habit: Secondary | ICD-10-CM | POA: Diagnosis present

## 2016-07-12 DIAGNOSIS — F419 Anxiety disorder, unspecified: Secondary | ICD-10-CM | POA: Diagnosis not present

## 2016-07-12 DIAGNOSIS — D122 Benign neoplasm of ascending colon: Secondary | ICD-10-CM | POA: Diagnosis not present

## 2016-07-12 MED ORDER — SODIUM CHLORIDE 0.9 % IV SOLN: 500.0000 mL | INTRAVENOUS | Status: DC

## 2016-07-12 NOTE — Progress Notes (Signed)
Report to PACU, RN, vss, BBS= Clear.  

## 2016-07-12 NOTE — Op Note (Signed)
[facility] Patient Name: Donna Robbins Procedure Date: 07/12/2016 11:24 AM MRN: [id number] Endoscopist: Gatha Mayer , MD Age: 46 Referring MD:  Date of Birth: Aug 18, 1970 Gender: Female Account #: [id number] Procedure:                Colonoscopy Indications:              Change in bowel habits Medicines:                Propofol per Anesthesia, Monitored Anesthesia Care Procedure:                Pre-Anesthesia Assessment:                           - Prior to the procedure, a History and Physical                            was performed, and patient medications and                            allergies were reviewed. The patient's tolerance of                            previous anesthesia was also reviewed. The risks                            and benefits of the procedure and the sedation                            options and risks were discussed with the patient.                            All questions were answered, and informed consent                            was obtained. Prior Anticoagulants: The patient has                            taken no previous anticoagulant or antiplatelet                            agents. ASA Grade Assessment: II - A patient with                            mild systemic disease. After reviewing the risks                            and benefits, the patient was deemed in                            satisfactory condition to undergo the procedure.                           After obtaining informed consent, the colonoscope  was passed under direct vision. Throughout the                            procedure, the patient's blood pressure, pulse, and                            oxygen saturations were monitored continuously. The                            Colonoscope was introduced through the anus and                            advanced to the the terminal ileum, with                            identification of  the appendiceal orifice and IC                            valve. The colonoscopy was performed without                            difficulty. The patient tolerated the procedure                            well. The quality of the bowel preparation was                            good. The bowel preparation used was Miralax. The                            terminal ileum, ileocecal valve, appendiceal                            orifice, and rectum were photographed. Scope In: 11:33:52 AM Scope Out: 11:50:04 AM Scope Withdrawal Time: 0 hours 11 minutes 45 seconds  Total Procedure Duration: 0 hours 16 minutes 12 seconds  Findings:                 The perianal and digital rectal examinations were                            normal.                           A 2 mm polyp was found in the ascending colon. The                            polyp was sessile. The polyp was removed with a                            cold biopsy forceps. Resection and retrieval were                            complete. Verification of patient identification  for the specimen was done. Estimated blood loss was                            minimal.                           Scattered small and large-mouthed diverticula were                            found in the sigmoid colon.                           Anal papilla(e) were hypertrophied.                           The exam was otherwise without abnormality on                            direct and retroflexion views. Complications:            No immediate complications. Estimated Blood Loss:     Estimated blood loss was minimal. Impression:               - One 2 mm polyp in the ascending colon, removed                            with a cold biopsy forceps. Resected and retrieved.                           - Diverticulosis in the sigmoid colon.                           - Anal papilla(e) were hypertrophied.                           - The examination  was otherwise normal on direct                            and retroflexion views. Recommendation:           - Patient has a contact number available for                            emergencies. The signs and symptoms of potential                            delayed complications were discussed with the                            patient. Return to normal activities tomorrow.                            Written discharge instructions were provided to the                            patient.                           -  Resume previous diet.                           - Continue present medications.                           - Repeat colonoscopy is recommended. The                            colonoscopy date will be determined after pathology                            results from today's exam become available for                            review.                           - Consider probiotic (VSL #3 qd), Benefiber                           IBS is problem I think - symptomatic treatment if                            needed                           Weight loss from diet and medication changes I think Gatha Mayer, MD 07/12/2016 11:58:32 AM This report has been signed electronically.

## 2016-07-12 NOTE — Progress Notes (Signed)
Called to room to assist during endoscopic procedure.  Patient ID and intended procedure confirmed with present staff. Received instructions for my participation in the procedure from the performing physician.  

## 2016-07-12 NOTE — Patient Instructions (Addendum)
I found and removed one tiny polyp that could have waited until you were 49 - I will let you know pathology results and when to have another routine colonoscopy by mail and/or My Chart.  Mild diverticulosis -  thickened muscle rings and pouches in the colon wall. Please read the handout about this condition.  All else ok.  Irritable Bowel Syndrome has changed pattern I think. As long as you can live with this no changes but consider a probiotic like VSL #3 1 each day (can get at [facility])  And possibly Benefiber 1-3 teaspoons a day.  Bottom line is nothing bad going on. Call or message me if ?  I appreciate the opportunity to care for you. Gatha Mayer, MD, [facility]  Diverticulosis handout given to patient. Polyp handout given to patient.  YOU HAD AN ENDOSCOPIC PROCEDURE TODAY AT [facility]:   Refer to the procedure report that was given to you for any specific questions about what was found during the examination.  If the procedure report does not answer your questions, please call your gastroenterologist to clarify.  If you requested that your care partner not be given the details of your procedure findings, then the procedure report has been included in a sealed envelope for you to review at your convenience later.  YOU SHOULD EXPECT: Some feelings of bloating in the abdomen. Passage of more gas than usual.  Walking can help get rid of the air that was put into your GI tract during the procedure and reduce the bloating. If you had a lower endoscopy (such as a colonoscopy or flexible sigmoidoscopy) you may notice spotting of blood in your stool or on the toilet paper. If you underwent a bowel prep for your procedure, you may not have a normal bowel movement for a few days.  Please Note:  You might notice some irritation and congestion in your nose or some drainage.  This is from the oxygen used during your procedure.  There is no need for concern and it  should clear up in a day or so.  SYMPTOMS TO REPORT IMMEDIATELY:   Following lower endoscopy (colonoscopy or flexible sigmoidoscopy):  Excessive amounts of blood in the stool  Significant tenderness or worsening of abdominal pains  Swelling of the abdomen that is new, acute  Fever of 100F or higher For urgent or emergent issues, a gastroenterologist can be reached at any hour by calling [phone].   DIET:  We do recommend a small meal at first, but then you may proceed to your regular diet.  Drink plenty of fluids but you should avoid alcoholic beverages for 24 hours.  ACTIVITY:  You should plan to take it easy for the rest of today and you should NOT DRIVE or use heavy machinery until tomorrow (because of the sedation medicines used during the test).    FOLLOW UP: Our staff will call the number listed on your records the next business day following your procedure to check on you and address any questions or concerns that you may have regarding the information given to you following your procedure. If we do not reach you, we will leave a message.  However, if you are feeling well and you are not experiencing any problems, there is no need to return our call.  We will assume that you have returned to your regular daily activities without incident.  If any biopsies were taken you will be contacted by phone  or by letter within the next 1-3 weeks.  Please call us at [phone] if you have not heard about the biopsies in 3 weeks.    SIGNATURES/CONFIDENTIALITY: You and/or your care partner have signed paperwork which will be entered into your electronic medical record.  These signatures attest to the fact that that the information above on your After Visit Summary has been reviewed and is understood.  Full responsibility of the confidentiality of this discharge information lies with you and/or your care-partner.

## 2016-07-15 ENCOUNTER — Telehealth: Payer: Self-pay | Admitting: *Deleted

## 2016-07-15 NOTE — Telephone Encounter (Signed)
  Follow up Call-  Call back number 07/12/2016  Post procedure Call Back phone  # [phone]  Permission to leave phone message Yes  Some recent data might be hidden     No answer at # given. Left message on VM.

## 2016-07-18 ENCOUNTER — Encounter: Payer: Self-pay | Admitting: Internal Medicine

## 2016-07-18 DIAGNOSIS — Z860101 Personal history of adenomatous and serrated colon polyps: Secondary | ICD-10-CM | POA: Insufficient documentation

## 2016-07-18 DIAGNOSIS — Z86010 Personal history of colonic polyps: Secondary | ICD-10-CM

## 2016-07-18 HISTORY — DX: Personal history of colonic polyps: Z86.010

## 2016-07-18 HISTORY — DX: Personal history of adenomatous and serrated colon polyps: Z86.0101

## 2016-07-18 NOTE — Progress Notes (Signed)
2 mm adenoma Recall 2023 My chart letter

## 2016-07-23 DIAGNOSIS — D2272 Melanocytic nevi of left lower limb, including hip: Secondary | ICD-10-CM | POA: Diagnosis not present

## 2016-07-23 DIAGNOSIS — D2262 Melanocytic nevi of left upper limb, including shoulder: Secondary | ICD-10-CM | POA: Diagnosis not present

## 2016-07-23 DIAGNOSIS — D225 Melanocytic nevi of trunk: Secondary | ICD-10-CM | POA: Diagnosis not present

## 2016-07-23 DIAGNOSIS — D2271 Melanocytic nevi of right lower limb, including hip: Secondary | ICD-10-CM | POA: Diagnosis not present

## 2016-07-23 DIAGNOSIS — D1801 Hemangioma of skin and subcutaneous tissue: Secondary | ICD-10-CM | POA: Diagnosis not present

## 2016-07-23 DIAGNOSIS — D2261 Melanocytic nevi of right upper limb, including shoulder: Secondary | ICD-10-CM | POA: Diagnosis not present

## 2016-07-24 ENCOUNTER — Encounter: Payer: 59 | Admitting: Internal Medicine

## 2016-08-02 ENCOUNTER — Other Ambulatory Visit: Payer: Self-pay | Admitting: Internal Medicine

## 2016-08-02 MED ORDER — CLONAZEPAM 0.5 MG PO TABS: 0.2500 mg | ORAL_TABLET | Freq: Two times a day (BID) | ORAL | 2 refills | Status: DC | PRN

## 2016-08-02 MED FILL — clonazePAM 0.5 MG TABS: 0.5 | 15 days supply | Qty: 30 | Fill #0

## 2016-08-07 ENCOUNTER — Encounter: Payer: 59 | Admitting: Internal Medicine

## 2016-08-09 ENCOUNTER — Encounter: Payer: 59 | Admitting: Internal Medicine

## 2016-08-15 ENCOUNTER — Encounter: Payer: Self-pay | Admitting: Internal Medicine

## 2016-09-17 MED FILL — DAPSONE 5% GEL: 5 | 25 days supply | Qty: 90 | Fill #0

## 2016-12-02 MED FILL — clonazePAM 0.5 MG TABS: 0.5 | 15 days supply | Qty: 30 | Fill #1

## 2017-01-29 MED FILL — clonazePAM 0.5 MG TABS: 0.5 | 15 days supply | Qty: 30 | Fill #2
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
## 2017-04-15 ENCOUNTER — Encounter: Payer: Self-pay | Admitting: Family Medicine

## 2017-04-15 ENCOUNTER — Ambulatory Visit: Payer: Self-pay

## 2017-04-15 ENCOUNTER — Other Ambulatory Visit: Payer: Self-pay

## 2017-04-15 ENCOUNTER — Ambulatory Visit: Payer: 59 | Admitting: Family Medicine

## 2017-04-15 VITALS — BP 110/82 | HR 75 | Ht 63.5 in | Wt 174.0 lb

## 2017-04-15 DIAGNOSIS — G8929 Other chronic pain: Secondary | ICD-10-CM | POA: Diagnosis not present

## 2017-04-15 DIAGNOSIS — M25562 Pain in left knee: Secondary | ICD-10-CM | POA: Diagnosis not present

## 2017-04-15 DIAGNOSIS — M25561 Pain in right knee: Principal | ICD-10-CM

## 2017-04-15 MED ORDER — VITAMIN D (ERGOCALCIFEROL) 1.25 MG (50000 UNIT) PO CAPS: 50000.0000 [IU] | ORAL_CAPSULE | ORAL | 0 refills | Status: DC

## 2017-04-15 MED FILL — VIT D2 1.25 MG (50,000 UNIT: 1.25 MG | 84 days supply | Qty: 12 | Fill #0

## 2017-04-15 NOTE — Assessment & Plan Note (Signed)
Patient does have more of a chronic left knee pain.  Patient may have a little stress reaction in the area.  Vitamin D supplementation given.  Discussed weight management, home exercises, compression, which activities as needed.  Follow-up again in 4 weeks to further evaluate.

## 2017-04-15 NOTE — Patient Instructions (Signed)
Nice to meet you  You do have a nonhealing fracture Ice 20 minutes 2 times daily. Usually after activity and before bed. OK to run but biking and elliptical would be much better pennsaid pinkie amount topically 2 times daily as needed.   [email or web address] size large knee compression  Arnica lotion still  Turmeric 500mg  daily  See me again in 4 weeks.
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
## 2017-05-13 ENCOUNTER — Ambulatory Visit: Payer: 59 | Admitting: Family Medicine

## 2017-05-13 ENCOUNTER — Encounter: Payer: Self-pay | Admitting: Family Medicine

## 2017-05-13 ENCOUNTER — Ambulatory Visit: Payer: Self-pay

## 2017-05-13 VITALS — BP 120/78 | HR 99 | Ht 63.0 in | Wt 182.0 lb

## 2017-05-13 DIAGNOSIS — M25561 Pain in right knee: Secondary | ICD-10-CM

## 2017-05-13 DIAGNOSIS — G8929 Other chronic pain: Secondary | ICD-10-CM

## 2017-05-13 DIAGNOSIS — M25562 Pain in left knee: Secondary | ICD-10-CM

## 2017-05-13 NOTE — Assessment & Plan Note (Signed)
Patient does have chronic pain in the area.  Seem to improve somewhat.  Given running progression, encouraged a low vitamin D, discussed topical anti-inflammatories, discussed new.  Meniscal cyst and the possibility of need for aspiration.  Patient is in agreement with plan.  Follow-up again in 4 weeks

## 2017-05-13 NOTE — Patient Instructions (Signed)
Good to see you  Donna Robbins is your friend.  Looks like a little cyst pf the meniscus  Consider K2 daily  For 1 month  Shoulder keep hands within peripheral vision  Think thumbs up  Push up with dumbbells See me again in 4-6 weeks if not perfect

## 2017-06-17 ENCOUNTER — Ambulatory Visit: Payer: 59 | Admitting: Family Medicine

## 2017-07-01 ENCOUNTER — Encounter: Payer: 59 | Admitting: Internal Medicine

## 2017-07-01 DIAGNOSIS — Z0289 Encounter for other administrative examinations: Secondary | ICD-10-CM

## 2017-07-01 MED FILL — DAPSONE 5% GEL: 5 | 30 days supply | Qty: 90 | Fill #0

## 2017-07-24 DIAGNOSIS — L570 Actinic keratosis: Secondary | ICD-10-CM | POA: Diagnosis not present

## 2017-07-25 ENCOUNTER — Encounter: Payer: 59 | Admitting: Internal Medicine

## 2017-08-01 ENCOUNTER — Other Ambulatory Visit (INDEPENDENT_AMBULATORY_CARE_PROVIDER_SITE_OTHER): Payer: 59

## 2017-08-01 ENCOUNTER — Encounter: Payer: Self-pay | Admitting: Internal Medicine

## 2017-08-01 ENCOUNTER — Ambulatory Visit (INDEPENDENT_AMBULATORY_CARE_PROVIDER_SITE_OTHER): Payer: 59 | Admitting: Internal Medicine

## 2017-08-01 VITALS — BP 98/62 | HR 75 | Temp 98.0°F | Ht 63.0 in | Wt 186.0 lb

## 2017-08-01 DIAGNOSIS — Z0000 Encounter for general adult medical examination without abnormal findings: Secondary | ICD-10-CM

## 2017-08-01 LAB — LIPID PANEL
CHOL/HDL RATIO: 2
Cholesterol: 139 mg/dL (ref 0–200)
HDL: 56 mg/dL (ref >39.00)
LDL CALC: 74 mg/dL (ref 0–99)
NONHDL: 83.15
TRIGLYCERIDES: 48 mg/dL (ref 0.0–149.0)
VLDL: 9.6 mg/dL (ref 0.0–40.0)

## 2017-08-01 LAB — CBC
HEMATOCRIT: 40 % (ref 36.0–46.0)
Hemoglobin: 13.3 g/dL (ref 12.0–15.0)
MCHC: 33.4 g/dL (ref 30.0–36.0)
MCV: 96.8 fl (ref 78.0–100.0)
Platelets: 312 10*3/uL (ref 150.0–400.0)
RBC: 4.13 Mil/uL (ref 3.87–5.11)
RDW: 13.1 % (ref 11.5–15.5)
WBC: 6.8 10*3/uL (ref 4.0–10.5)

## 2017-08-01 LAB — TSH: TSH: 1.51 u[IU]/mL (ref 0.35–4.50)

## 2017-08-01 LAB — COMPREHENSIVE METABOLIC PANEL
ALT: 17 U/L (ref 0–35)
AST: 17 U/L (ref 0–37)
Albumin: 3.9 g/dL (ref 3.5–5.2)
Alkaline Phosphatase: 42 U/L (ref 39–117)
BILIRUBIN TOTAL: 0.4 mg/dL (ref 0.2–1.2)
BUN: 9 mg/dL (ref 6–23)
CALCIUM: 8.9 mg/dL (ref 8.4–10.5)
CHLORIDE: 106 meq/L (ref 96–112)
CO2: 30 meq/L (ref 19–32)
Creatinine, Ser: 0.53 mg/dL (ref 0.40–1.20)
GFR: 130.92 mL/min (ref >60.00)
GLUCOSE: 87 mg/dL (ref 70–99)
POTASSIUM: 4.1 meq/L (ref 3.5–5.1)
Sodium: 141 mEq/L (ref 135–145)
Total Protein: 6.7 g/dL (ref 6.0–8.3)

## 2017-08-01 LAB — HEMOGLOBIN A1C: HEMOGLOBIN A1C: 5.1 % (ref 4.6–6.5)

## 2017-08-01 LAB — T4, FREE: Free T4: 0.83 ng/dL (ref 0.60–1.60)

## 2017-08-01 LAB — FERRITIN: Ferritin: 39.8 ng/mL (ref 10.0–291.0)

## 2017-08-01 LAB — VITAMIN B12: Vitamin B-12: 270 pg/mL (ref 211–911)

## 2017-08-01 LAB — VITAMIN D 25 HYDROXY (VIT D DEFICIENCY, FRACTURES): VITD: 31.42 ng/mL (ref 30.00–100.00)

## 2017-08-01 NOTE — Patient Instructions (Signed)

## 2017-08-01 NOTE — Progress Notes (Signed)
   Subjective:    Patient ID: Donna Robbins, female    DOB: 10/25/1970, 47 y.o.   MRN: [id number]  HPI The patient is a 47 YO female coming in for physical. Not taking enough time for herself and not sleeping enough lately and some stressed.  PMH, [facility], social history reviewed and updated.   Review of Systems  Constitutional: Positive for fatigue.  HENT: Negative.   Eyes: Negative.   Respiratory: Negative for cough, chest tightness and shortness of breath.   Cardiovascular: Negative for chest pain, palpitations and leg swelling.  Gastrointestinal: Negative for abdominal distention, abdominal pain, constipation, diarrhea, nausea and vomiting.  Musculoskeletal: Negative.   Skin: Negative.   Neurological: Negative.   Psychiatric/Behavioral: Negative.       Objective:   Physical Exam  Constitutional: She is oriented to person, place, and time. She appears well-developed and well-nourished.  HENT:  Head: Normocephalic and atraumatic.  Eyes: EOM are normal.  Neck: Normal range of motion.  Cardiovascular: Normal rate and regular rhythm.  Pulmonary/Chest: Effort normal and breath sounds normal. No respiratory distress. She has no wheezes. She has no rales.  Abdominal: Soft. Bowel sounds are normal. She exhibits no distension. There is no tenderness. There is no rebound.  Musculoskeletal: She exhibits no edema.  Neurological: She is alert and oriented to person, place, and time. Coordination normal.  Skin: Skin is warm and dry.  Psychiatric: She has a normal mood and affect.   Vitals:   08/01/17 0816  BP: 98/62  Pulse: 75  Temp: 98 F (36.7 C)  TempSrc: Oral  SpO2: 98%  Weight: 186 lb (84.4 kg)  Height: 5\' 3"  (1.6 m)      Assessment & Plan:

## 2017-08-01 NOTE — Assessment & Plan Note (Signed)
Colonoscopy done last year with some polyps. Flu yearly. Tetanus up to date. Mammogram and pap smear with gyn. Counseled about sun safety and mole surveillance. Given screening recommendations. Counseled about dangers of distracted driving.

## 2017-08-08 DIAGNOSIS — Z1231 Encounter for screening mammogram for malignant neoplasm of breast: Secondary | ICD-10-CM | POA: Diagnosis not present

## 2017-08-08 LAB — HM MAMMOGRAPHY

## 2017-08-11 ENCOUNTER — Other Ambulatory Visit: Payer: Self-pay | Admitting: Internal Medicine

## 2017-08-11 ENCOUNTER — Encounter: Payer: Self-pay | Admitting: Internal Medicine

## 2017-08-11 MED FILL — clonazePAM 0.5 MG TABS: 0.5 | 15 days supply | Qty: 30 | Fill #0

## 2017-08-11 NOTE — Progress Notes (Signed)
Abstracted and sent to scan  

## 2017-08-11 NOTE — Telephone Encounter (Signed)
Control database checked last refill: 01/29/2017 LOV: 08/01/2017

## 2017-09-30 DIAGNOSIS — H40013 Open angle with borderline findings, low risk, bilateral: Secondary | ICD-10-CM | POA: Diagnosis not present

## 2017-09-30 DIAGNOSIS — H5213 Myopia, bilateral: Secondary | ICD-10-CM | POA: Diagnosis not present

## 2017-10-14 MED FILL — clonazePAM 0.5 MG TABS: 0.5 | 15 days supply | Qty: 30 | Fill #1

## 2017-11-18 DIAGNOSIS — Z01419 Encounter for gynecological examination (general) (routine) without abnormal findings: Secondary | ICD-10-CM | POA: Diagnosis not present

## 2017-11-18 DIAGNOSIS — Z1151 Encounter for screening for human papillomavirus (HPV): Secondary | ICD-10-CM | POA: Diagnosis not present

## 2017-11-18 DIAGNOSIS — Z6834 Body mass index (BMI) 34.0-34.9, adult: Secondary | ICD-10-CM | POA: Diagnosis not present

## 2017-11-20 LAB — HM PAP SMEAR

## 2017-12-22 MED FILL — clonazePAM 0.5 MG TABS: 0.5 | 15 days supply | Qty: 30 | Fill #2

## 2018-02-13 ENCOUNTER — Other Ambulatory Visit: Payer: Self-pay | Admitting: Internal Medicine

## 2018-02-13 NOTE — Telephone Encounter (Signed)
Control database checked last refill: 01/29/2017 LOV: 08/01/2017 NOV: none

## 2018-02-25 MED FILL — clonazePAM 0.5 MG TABS: 0.5 | 15 days supply | Qty: 30 | Fill #0

## 2018-04-20 MED FILL — clonazePAM 0.5 MG TABS: 0.5 | 15 days supply | Qty: 30 | Fill #1

## 2018-04-29 ENCOUNTER — Encounter: Payer: Self-pay | Admitting: Internal Medicine

## 2018-06-14 ENCOUNTER — Encounter: Payer: Self-pay | Admitting: Internal Medicine

## 2018-06-15 MED ORDER — CLONAZEPAM 0.5 MG PO TABS: ORAL_TABLET | ORAL | 2 refills | Status: DC

## 2018-06-18 ENCOUNTER — Encounter: Payer: Self-pay | Admitting: Internal Medicine

## 2018-06-25 MED FILL — clonazePAM 0.5 MG TABS: 0.5 | 15 days supply | Qty: 30 | Fill #0

## 2018-07-01 DIAGNOSIS — N898 Other specified noninflammatory disorders of vagina: Secondary | ICD-10-CM | POA: Diagnosis not present

## 2018-07-01 MED FILL — NYSTATIN-TRIAMCINOLONE OINT: 100000-0.1 | 10 days supply | Qty: 15 | Fill #0

## 2018-07-01 MED FILL — FLUCONAZOLE 150 MG TABS: 150 | 7 days supply | Qty: 3 | Fill #0

## 2018-08-21 DIAGNOSIS — Z1231 Encounter for screening mammogram for malignant neoplasm of breast: Secondary | ICD-10-CM | POA: Diagnosis not present

## 2018-08-21 LAB — HM MAMMOGRAPHY

## 2018-09-01 ENCOUNTER — Encounter: Payer: Self-pay | Admitting: Internal Medicine

## 2018-09-01 NOTE — Progress Notes (Signed)
Abstracted and sent to scan  

## 2018-10-06 DIAGNOSIS — L239 Allergic contact dermatitis, unspecified cause: Secondary | ICD-10-CM | POA: Diagnosis not present

## 2018-10-06 MED FILL — TOBRADEX EYE OINTMENT: 0.3-0.1 | 10 days supply | Qty: 4 | Fill #0

## 2018-10-29 ENCOUNTER — Other Ambulatory Visit: Payer: Self-pay | Admitting: Internal Medicine

## 2018-10-30 NOTE — Telephone Encounter (Signed)
Control database checked last refill: 06/25/2018 30 tabs LOV:08/01/2017 [id number]

## 2018-10-30 NOTE — Telephone Encounter (Signed)
CPE scheduled  

## 2018-10-30 NOTE — Telephone Encounter (Signed)
Will fill but needs some kind of visit for any more refills.

## 2018-10-30 NOTE — Telephone Encounter (Signed)
Can you mkae patient a annual visit. Please and thank you

## 2018-11-16 MED FILL — clonazePAM 0.5 MG TABS: 0.5 | 15 days supply | Qty: 30 | Fill #0

## 2018-11-24 ENCOUNTER — Encounter: Payer: Self-pay | Admitting: Internal Medicine

## 2018-11-24 ENCOUNTER — Ambulatory Visit (INDEPENDENT_AMBULATORY_CARE_PROVIDER_SITE_OTHER): Payer: 59 | Admitting: Internal Medicine

## 2018-11-24 ENCOUNTER — Other Ambulatory Visit: Payer: Self-pay

## 2018-11-24 ENCOUNTER — Other Ambulatory Visit (INDEPENDENT_AMBULATORY_CARE_PROVIDER_SITE_OTHER): Payer: 59

## 2018-11-24 VITALS — BP 122/86 | HR 73 | Temp 98.4°F | Ht 63.0 in | Wt 198.0 lb

## 2018-11-24 DIAGNOSIS — Z0000 Encounter for general adult medical examination without abnormal findings: Secondary | ICD-10-CM

## 2018-11-24 DIAGNOSIS — E669 Obesity, unspecified: Secondary | ICD-10-CM | POA: Diagnosis not present

## 2018-11-24 DIAGNOSIS — Z23 Encounter for immunization: Secondary | ICD-10-CM | POA: Diagnosis not present

## 2018-11-24 DIAGNOSIS — G8929 Other chronic pain: Secondary | ICD-10-CM | POA: Diagnosis not present

## 2018-11-24 DIAGNOSIS — Z6835 Body mass index (BMI) 35.0-35.9, adult: Secondary | ICD-10-CM | POA: Diagnosis not present

## 2018-11-24 DIAGNOSIS — F419 Anxiety disorder, unspecified: Secondary | ICD-10-CM | POA: Diagnosis not present

## 2018-11-24 DIAGNOSIS — M25562 Pain in left knee: Secondary | ICD-10-CM

## 2018-11-24 LAB — LIPID PANEL
Cholesterol: 151 mg/dL (ref 0–200)
HDL: 49.3 mg/dL (ref >39.00)
LDL Cholesterol: 86 mg/dL (ref 0–99)
NonHDL: 101.32
Total CHOL/HDL Ratio: 3
Triglycerides: 79 mg/dL (ref 0.0–149.0)
VLDL: 15.8 mg/dL (ref 0.0–40.0)

## 2018-11-24 LAB — COMPREHENSIVE METABOLIC PANEL
ALT: 12 U/L (ref 0–35)
AST: 15 U/L (ref 0–37)
Albumin: 4.2 g/dL (ref 3.5–5.2)
Alkaline Phosphatase: 50 U/L (ref 39–117)
BUN: 14 mg/dL (ref 6–23)
CO2: 30 mEq/L (ref 19–32)
Calcium: 9 mg/dL (ref 8.4–10.5)
Chloride: 103 mEq/L (ref 96–112)
Creatinine, Ser: 0.68 mg/dL (ref 0.40–1.20)
GFR: 91.89 mL/min (ref >60.00)
Glucose, Bld: 85 mg/dL (ref 70–99)
Potassium: 3.8 mEq/L (ref 3.5–5.1)
Sodium: 140 mEq/L (ref 135–145)
Total Bilirubin: 0.4 mg/dL (ref 0.2–1.2)
Total Protein: 7.2 g/dL (ref 6.0–8.3)

## 2018-11-24 LAB — CBC
HCT: 39.3 % (ref 36.0–46.0)
Hemoglobin: 13.2 g/dL (ref 12.0–15.0)
MCHC: 33.5 g/dL (ref 30.0–36.0)
MCV: 95.6 fl (ref 78.0–100.0)
Platelets: 327 10*3/uL (ref 150.0–400.0)
RBC: 4.11 Mil/uL (ref 3.87–5.11)
RDW: 13.2 % (ref 11.5–15.5)
WBC: 6.7 10*3/uL (ref 4.0–10.5)

## 2018-11-24 LAB — HEMOGLOBIN A1C: Hgb A1c MFr Bld: 5.4 % (ref 4.6–6.5)

## 2018-11-24 MED ORDER — CLONAZEPAM 0.5 MG PO TABS: 0.2500 mg | ORAL_TABLET | Freq: Every day | ORAL | 0 refills | Status: DC | PRN

## 2018-11-24 MED ORDER — TRAZODONE HCL 50 MG PO TABS: 25.0000 mg | ORAL_TABLET | Freq: Every evening | ORAL | 3 refills | Status: DC | PRN

## 2018-11-24 MED FILL — traZODone HCL 50 MG TABS: 50 | 30 days supply | Qty: 30 | Fill #0

## 2018-11-24 NOTE — Progress Notes (Signed)
   Subjective:   Patient ID: Donna Robbins, female    DOB: [birth year], 48 y.o.   MRN: [id number]  HPI The patient is a 48 YO female coming in for physical.   PMH, [facility], social history reviewed and updated.   Review of Systems  Constitutional: Negative.   HENT: Negative.   Eyes: Negative.   Respiratory: Negative for cough, chest tightness and shortness of breath.   Cardiovascular: Negative for chest pain, palpitations and leg swelling.  Gastrointestinal: Negative for abdominal distention, abdominal pain, constipation, diarrhea, nausea and vomiting.  Musculoskeletal: Negative.   Skin: Negative.   Neurological: Negative.   Psychiatric/Behavioral: Positive for sleep disturbance.    Objective:  Physical Exam Constitutional:      Appearance: She is well-developed.  HENT:     Head: Normocephalic and atraumatic.  Neck:     Musculoskeletal: Normal range of motion.  Cardiovascular:     Rate and Rhythm: Normal rate and regular rhythm.  Pulmonary:     Effort: Pulmonary effort is normal. No respiratory distress.     Breath sounds: Normal breath sounds. No wheezing or rales.  Abdominal:     General: Bowel sounds are normal. There is no distension.     Palpations: Abdomen is soft.     Tenderness: There is no abdominal tenderness. There is no rebound.  Musculoskeletal:        General: No tenderness.  Skin:    General: Skin is warm and dry.  Neurological:     Mental Status: She is alert and oriented to person, place, and time.     Coordination: Coordination normal.     Vitals:   11/24/18 0758  BP: 122/86  Pulse: 73  Temp: 98.4 F (36.9 C)  TempSrc: Oral  SpO2: 98%  Weight: 198 lb (89.8 kg)  Height: 5\' 3"  (1.6 m)    Assessment & Plan:  Flu shot given at visit

## 2018-11-24 NOTE — Assessment & Plan Note (Signed)
Stable, chronic and managed with switch to bike for exercise rather than running.

## 2018-11-24 NOTE — Assessment & Plan Note (Signed)
Flu shot given. Tetanus up to date. Colonoscopy up to date. Mammogram up to date, pap smear up to date. Counseled about sun safety and mole surveillance. Counseled about the dangers of distracted driving. Given 10 year screening recommendations.

## 2018-11-24 NOTE — Assessment & Plan Note (Signed)
Weight is up due to pandemic and lack of sleep and exercise. She will work on it.

## 2018-11-24 NOTE — Patient Instructions (Signed)
Health Maintenance, Female Adopting a healthy lifestyle and getting preventive care are important in promoting health and wellness. Ask your health care provider about:  The right schedule for you to have regular tests and exams.  Things you can do on your own to prevent diseases and keep yourself healthy. What should I know about diet, weight, and exercise? Eat a healthy diet   Eat a diet that includes plenty of vegetables, fruits, low-fat dairy products, and lean protein.  Do not eat a lot of foods that are high in solid fats, added sugars, or sodium. Maintain a healthy weight Body mass index (BMI) is used to identify weight problems. It estimates body fat based on height and weight. Your health care provider can help determine your BMI and help you achieve or maintain a healthy weight. Get regular exercise Get regular exercise. This is one of the most important things you can do for your health. Most adults should:  Exercise for at least 150 minutes each week. The exercise should increase your heart rate and make you sweat (moderate-intensity exercise).  Do strengthening exercises at least twice a week. This is in addition to the moderate-intensity exercise.  Spend less time sitting. Even light physical activity can be beneficial. Watch cholesterol and blood lipids Have your blood tested for lipids and cholesterol at 49 years of age, then have this test every 5 years. Have your cholesterol levels checked more often if:  Your lipid or cholesterol levels are high.  You are older than 49 years of age.  You are at high risk for heart disease. What should I know about cancer screening? Depending on your health history and family history, you may need to have cancer screening at various ages. This may include screening for:  Breast cancer.  Cervical cancer.  Colorectal cancer.  Skin cancer.  Lung cancer. What should I know about heart disease, diabetes, and high blood  pressure? Blood pressure and heart disease  High blood pressure causes heart disease and increases the risk of stroke. This is more likely to develop in people who have high blood pressure readings, are of African descent, or are overweight.  Have your blood pressure checked: ? Every 3-5 years if you are 18-39 years of age. ? Every year if you are 40 years old or older. Diabetes Have regular diabetes screenings. This checks your fasting blood sugar level. Have the screening done:  Once every three years after age 40 if you are at a normal weight and have a low risk for diabetes.  More often and at a younger age if you are overweight or have a high risk for diabetes. What should I know about preventing infection? Hepatitis B If you have a higher risk for hepatitis B, you should be screened for this virus. Talk with your health care provider to find out if you are at risk for hepatitis B infection. Hepatitis C Testing is recommended for:  Everyone born from 1945 through 1965.  Anyone with known risk factors for hepatitis C. Sexually transmitted infections (STIs)  Get screened for STIs, including gonorrhea and chlamydia, if: ? You are sexually active and are younger than 49 years of age. ? You are older than 49 years of age and your health care provider tells you that you are at risk for this type of infection. ? Your sexual activity has changed since you were last screened, and you are at increased risk for chlamydia or gonorrhea. Ask your health care provider if   you are at risk.  Ask your health care provider about whether you are at high risk for HIV. Your health care provider may recommend a prescription medicine to help prevent HIV infection. If you choose to take medicine to prevent HIV, you should first get tested for HIV. You should then be tested every 3 months for as long as you are taking the medicine. Pregnancy  If you are about to stop having your period (premenopausal) and  you may become pregnant, seek counseling before you get pregnant.  Take 400 to 800 micrograms (mcg) of folic acid every day if you become pregnant.  Ask for birth control (contraception) if you want to prevent pregnancy. Osteoporosis and menopause Osteoporosis is a disease in which the bones lose minerals and strength with aging. This can result in bone fractures. If you are 65 years old or older, or if you are at risk for osteoporosis and fractures, ask your health care provider if you should:  Be screened for bone loss.  Take a calcium or vitamin D supplement to lower your risk of fractures.  Be given hormone replacement therapy (HRT) to treat symptoms of menopause. Follow these instructions at home: Lifestyle  Do not use any products that contain nicotine or tobacco, such as cigarettes, e-cigarettes, and chewing tobacco. If you need help quitting, ask your health care provider.  Do not use street drugs.  Do not share needles.  Ask your health care provider for help if you need support or information about quitting drugs. Alcohol use  Do not drink alcohol if: ? Your health care provider tells you not to drink. ? You are pregnant, may be pregnant, or are planning to become pregnant.  If you drink alcohol: ? Limit how much you use to 0-1 drink a day. ? Limit intake if you are breastfeeding.  Be aware of how much alcohol is in your drink. In the [place] one drink equals one 12 oz bottle of beer (355 mL), one 5 oz glass of wine (148 mL), or one 1 oz glass of hard liquor (44 mL). General instructions  Schedule regular health, dental, and eye exams.  Stay current with your vaccines.  Tell your health care provider if: ? You often feel depressed. ? You have ever been abused or do not feel safe at home. Summary  Adopting a healthy lifestyle and getting preventive care are important in promoting health and wellness.  Follow your health care provider's instructions about healthy  diet, exercising, and getting tested or screened for diseases.  Follow your health care provider's instructions on monitoring your cholesterol and blood pressure. This information is not intended to replace advice given to you by your health care provider. Make sure you discuss any questions you have with your health care provider. Document Released: 09/24/2010 Document Revised: 03/04/2018 Document Reviewed: 03/04/2018 Elsevier Patient Education  2020 Elsevier Inc.  

## 2018-11-24 NOTE — Assessment & Plan Note (Signed)
Taking clonazepam rarely as needed. Will add trazodone for sleep as needed to help as she is having insomnia related to stress currently.

## 2018-11-25 ENCOUNTER — Encounter: Payer: Self-pay | Admitting: Internal Medicine

## 2018-11-25 NOTE — Progress Notes (Signed)
Abstracted and sent to scan  

## 2018-12-04 ENCOUNTER — Other Ambulatory Visit: Payer: Self-pay

## 2018-12-04 ENCOUNTER — Ambulatory Visit (INDEPENDENT_AMBULATORY_CARE_PROVIDER_SITE_OTHER): Payer: 59

## 2018-12-04 ENCOUNTER — Encounter: Payer: Self-pay | Admitting: Podiatry

## 2018-12-04 ENCOUNTER — Ambulatory Visit (INDEPENDENT_AMBULATORY_CARE_PROVIDER_SITE_OTHER): Payer: 59 | Admitting: Podiatry

## 2018-12-04 VITALS — BP 111/76 | HR 76 | Resp 16

## 2018-12-04 DIAGNOSIS — M2012 Hallux valgus (acquired), left foot: Secondary | ICD-10-CM | POA: Diagnosis not present

## 2018-12-04 DIAGNOSIS — M779 Enthesopathy, unspecified: Secondary | ICD-10-CM | POA: Diagnosis not present

## 2018-12-04 MED ORDER — DICLOFENAC SODIUM 1 % TD GEL: 2.0000 g | Freq: Four times a day (QID) | TRANSDERMAL | 2 refills | Status: DC

## 2018-12-04 MED FILL — DICLOFENAC SODIUM 1 % GEL: 1 | 12 days supply | Qty: 100 | Fill #0

## 2018-12-07 NOTE — Progress Notes (Signed)
Subjective:   Patient ID: [name], female   DOB: 48 y.o.   MRN: [id number]   HPI 48 year old female presents the office with concerns of aching, discomfort to the left foot pointing to the first area.  Is been on the left several months however she is had bunion deformity for several years.  Recently she gets redness of the area and that shoes are uncomfortable.  She denies any recent injury or trauma.  She has no recent injury to the area no other concerns.   Review of Systems  All other systems reviewed and are negative.  Past Medical History:  Diagnosis Date  . Allergy   . Anxiety   . Depression   . GERD (gastroesophageal reflux disease)   . HEARING LOSS   . Hx of adenomatous polyp of colon 07/18/2016  . Irritable bowel syndrome   . OBESITY     Past Surgical History:  Procedure Laterality Date  . CESAREAN SECTION     x's 2  . CHOLECYSTECTOMY  2009  . ENT surgery  2004     Current Outpatient Medications:  .  vitamin B-12 (CYANOCOBALAMIN) 1000 MCG tablet, , Disp: , Rfl:  .  Biotin 10000 MCG TABS, Take by mouth., Disp: , Rfl:  .  cholecalciferol (VITAMIN D) 1000 units tablet, Take 5,000 Units by mouth daily. , Disp: , Rfl:  .  clonazePAM (KLONOPIN) 0.5 MG tablet, Take 0.5-1 tablets (0.25-0.5 mg total) by mouth daily as needed for anxiety., Disp: 30 tablet, Rfl: 0 .  Dapsone 5 % topical gel, , Disp: , Rfl:  .  desonide (DESOWEN) 0.05 % cream, Apply topically 2 (two) times daily., Disp: , Rfl:  .  diclofenac sodium (VOLTAREN) 1 % GEL, Apply 2 g topically 4 (four) times daily. Rub into affected area of foot 2 to 4 times daily, Disp: 100 g, Rfl: 2 .  ferrous sulfate 325 (65 FE) MG EC tablet, Take 325 mg by mouth daily. , Disp: , Rfl:  .  Multiple Vitamin (MULTIVITAMIN) capsule, Take 1 capsule by mouth daily., Disp: , Rfl:  .  nystatin-triamcinolone ointment (MYCOLOG), , Disp: , Rfl:  .  TOBRADEX ophthalmic ointment, , Disp: , Rfl:  .  traZODone (DESYREL) 50 MG  tablet, Take 0.5-1 tablets (25-50 mg total) by mouth at bedtime as needed for sleep., Disp: 30 tablet, Rfl: 3  Allergies  Allergen Reactions  . Iodine     REACTION: Itching  . Sulfonamide Derivatives     REACTION: Itching Rash         Objective:  Physical Exam  General: AAO x3, NAD  Dermatological: Skin is warm, dry and supple bilateral. Nails x 10 are well manicured; remaining integument appears unremarkable at this time. There are no open sores, no preulcerative lesions, no rash or signs of infection present.  Vascular: Dorsalis Pedis artery and Posterior Tibial artery pedal pulses are 2/4 bilateral with immedate capillary fill time. Pedal hair growth present. No varicosities and no lower extremity edema present bilateral. There is no pain with calf compression, swelling, warmth, erythema.   Neruologic: Grossly intact via light touch bilateral. VProtective threshold with Semmes Wienstein monofilament intact to all pedal sites bilateral.   Musculoskeletal: Moderate bunion deformity is present on the left side worse than right.  No crepitation restriction for secondary range of motion but mild discomfort.  Mild erythema just on the bunion site and irritation.  No swelling, ascending cellulitis or any signs of infection.  Subjectively she is  getting numbness to the side of the toe on the bunion at times.  Muscular strength 5/5 in all groups tested bilateral.  Gait: Unassisted, Nonantalgic.       Assessment:   48 year old female with left foot bunion deformity, capsulitis    Plan:  -Treatment options discussed including all alternatives, risks, and complications -Etiology of symptoms were discussed -X-rays were obtained and reviewed with the patient. Bunion deformities present.  No evidence of acute fracture. -We discussed both conservative as well as surgical treatment options.  She also declined surgical intervention.  Prescribed Voltaren gel.  Offered steroid injection if we  need to.  Discussed shoe modifications and orthotics.  Likely do a dress orthotic.  I will have her come in to get molded for the inserts but I want her to bring up some shoes that she typically wears at work for when she gets molded  Trula Slade DPM

## 2018-12-22 ENCOUNTER — Ambulatory Visit (INDEPENDENT_AMBULATORY_CARE_PROVIDER_SITE_OTHER): Payer: 59 | Admitting: Orthotics

## 2018-12-22 ENCOUNTER — Other Ambulatory Visit: Payer: Self-pay

## 2018-12-22 DIAGNOSIS — M7752 Other enthesopathy of left foot: Secondary | ICD-10-CM

## 2018-12-22 DIAGNOSIS — M7751 Other enthesopathy of right foot: Secondary | ICD-10-CM | POA: Diagnosis not present

## 2018-12-22 DIAGNOSIS — M2012 Hallux valgus (acquired), left foot: Secondary | ICD-10-CM

## 2018-12-22 DIAGNOSIS — M779 Enthesopathy, unspecified: Secondary | ICD-10-CM

## 2018-12-22 NOTE — Progress Notes (Signed)
Dress orthoics to go in flats; biggest issue she needs is not too aggressive arch support. Plan on slim dress device sulcus length.

## 2019-01-25 MED FILL — traZODone HCL 50 MG TABS: 50 | 30 days supply | Qty: 30 | Fill #1

## 2019-02-03 ENCOUNTER — Other Ambulatory Visit: Payer: Self-pay | Admitting: Internal Medicine

## 2019-02-04 ENCOUNTER — Telehealth: Payer: Self-pay | Admitting: Podiatry
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
## 2019-02-05 MED ORDER — CLONAZEPAM 0.5 MG PO TABS: 0.2500 mg | ORAL_TABLET | Freq: Every day | ORAL | 0 refills | Status: DC | PRN

## 2019-02-05 MED FILL — clonazePAM 0.5 MG TABS: 0.5 | 30 days supply | Qty: 30 | Fill #0

## 2019-02-05 NOTE — Telephone Encounter (Signed)
Done erx 

## 2019-02-22 DIAGNOSIS — L700 Acne vulgaris: Secondary | ICD-10-CM | POA: Diagnosis not present

## 2019-02-22 MED FILL — CLINDAMYCIN PH 1% GEL: 1 | 30 days supply | Qty: 60 | Fill #0

## 2019-03-29 DIAGNOSIS — H5213 Myopia, bilateral: Secondary | ICD-10-CM | POA: Diagnosis not present

## 2019-04-01 ENCOUNTER — Encounter: Payer: Self-pay | Admitting: Internal Medicine

## 2019-04-01 MED ORDER — CLONAZEPAM 0.5 MG PO TABS: 0.2500 mg | ORAL_TABLET | Freq: Every day | ORAL | 5 refills | Status: DC | PRN

## 2019-04-01 MED ORDER — TRAZODONE HCL 50 MG PO TABS: 25.0000 mg | ORAL_TABLET | Freq: Every evening | ORAL | 3 refills | Status: DC | PRN

## 2019-04-01 MED FILL — clonazePAM 0.5 MG TABS: 0.5 | 30 days supply | Qty: 30 | Fill #0

## 2019-04-01 MED FILL — traZODone HCL 50 MG TABS: 50 | 90 days supply | Qty: 90 | Fill #0

## 2019-07-20 MED FILL — clonazePAM 0.5 MG TABS: 0.5 | 30 days supply | Qty: 30 | Fill #1

## 2019-08-31 ENCOUNTER — Other Ambulatory Visit: Payer: Self-pay

## 2019-08-31 ENCOUNTER — Ambulatory Visit (INDEPENDENT_AMBULATORY_CARE_PROVIDER_SITE_OTHER)
Admission: RE | Admit: 2019-08-31 | Discharge: 2019-08-31 | Disposition: A | Discharge status: Home / Self Care | Payer: 59 | Source: Ambulatory Visit

## 2019-08-31 DIAGNOSIS — R350 Frequency of micturition: Secondary | ICD-10-CM

## 2019-08-31 DIAGNOSIS — R300 Dysuria: Secondary | ICD-10-CM | POA: Diagnosis not present

## 2019-08-31 MED ORDER — NITROFURANTOIN MONOHYD MACRO 100 MG PO CAPS: 100.0000 mg | ORAL_CAPSULE | Freq: Two times a day (BID) | ORAL | 0 refills | Status: DC

## 2019-08-31 NOTE — Discharge Instructions (Signed)
Symptoms consistent for UTI Patient unable to go to UC in person at this time for urine analysis Take antibiotic as directed and to completion Follow up with PCP if symptoms persists Follow up in person or go to ER if you have any new or worsening symptoms such as fever, worsening abdominal pain, nausea/vomiting, flank pain, etc..Marland Kitchen

## 2019-08-31 NOTE — ED Provider Notes (Signed)
[facility]   Virtual Visit via Video Note:  Donna Robbins  initiated request for Telemedicine visit with [facility] Urgent Care team. I connected with Donna Robbins  on 08/31/2019 at 11:44 AM  for a synchronized telemedicine visit using a video enabled HIPPA compliant telemedicine application. I verified that I am speaking with Donna Robbins  using two identifiers. Donna Box, PA-C  was physically located in a [facility] Urgent care site and Donna Robbins was located at a different location.   The limitations of evaluation and management by telemedicine as well as the availability of in-person appointments were discussed. Patient was informed that she  may incur a bill ( including co-pay) for this virtual visit encounter. Donna Robbins  expressed understanding and gave verbal consent to proceed with virtual visit.   [id number] 08/31/19 Arrival Time: 1134  CC: "UTI"   SUBJECTIVE:  Donna Robbins is a 49 y.o. female who complains of cloudy urine, dysuria, urinary frequency, and urgency x 5 days.  Admits to excessive caffeine intake and decreased water intake.  Has tried OTC medications with minimal relief.  Symptoms are made worse with urination.  Admits to similar symptoms in the past with UTI.  Denies fever, chills, nausea, vomiting, abdominal pain, flank pain, vaginal discharge, vaginal odor, vaginal pain, hematuria.    LMP: Last week   ROS: As in HPI.  All other pertinent ROS negative.     Past Medical History:  Diagnosis Date  . Allergy   . Anxiety   . Depression   . GERD (gastroesophageal reflux disease)   . HEARING LOSS   . Hx of adenomatous polyp of colon 07/18/2016  . Irritable bowel syndrome   . OBESITY    Past Surgical History:  Procedure Laterality Date  . CESAREAN SECTION     x's 2  . CHOLECYSTECTOMY  2009  . ENT surgery  2004   Allergies  Allergen Reactions  . Iodine     REACTION: Itching  . Sulfonamide  Derivatives     REACTION: Itching Rash   No current facility-administered medications on file prior to encounter.   Current Outpatient Medications on File Prior to Encounter  Medication Sig Dispense Refill  . Biotin 10000 MCG TABS Take by mouth.    . cholecalciferol (VITAMIN D) 1000 units tablet Take 5,000 Units by mouth daily.     . clonazePAM (KLONOPIN) 0.5 MG tablet Take 0.5-1 tablets (0.25-0.5 mg total) by mouth daily as needed for anxiety. 30 tablet 5  . Dapsone 5 % topical gel     . desonide (DESOWEN) 0.05 % cream Apply topically 2 (two) times daily.    . diclofenac sodium (VOLTAREN) 1 % GEL Apply 2 g topically 4 (four) times daily. Rub into affected area of foot 2 to 4 times daily 100 g 2  . ferrous sulfate 325 (65 FE) MG EC tablet Take 325 mg by mouth daily.     . Multiple Vitamin (MULTIVITAMIN) capsule Take 1 capsule by mouth daily.    . traZODone (DESYREL) 50 MG tablet Take 0.5-1 tablets (25-50 mg total) by mouth at bedtime as needed for sleep. 90 tablet 3  . vitamin B-12 (CYANOCOBALAMIN) 1000 MCG tablet       OBJECTIVE:  There were no vitals filed for this visit.  General appearance: alert; no distress Eyes: EOMI grossly HENT: normocephalic; atraumatic Neck: supple with FROM Lungs: normal respiratory effort; speaking in full sentences without difficulty Extremities: moves extremities  without difficulty Skin: No obvious rashes Neurologic: No facial asymmetries Psychological: alert and cooperative; normal mood and affect   ASSESSMENT & PLAN:  1. Dysuria   2. Urinary frequency     Meds ordered this encounter  Medications  . nitrofurantoin, macrocrystal-monohydrate, (MACROBID) 100 MG capsule    Sig: Take 1 capsule (100 mg total) by mouth 2 (two) times daily.    Dispense:  10 capsule    Refill:  0    Order Specific Question:   Supervising Provider    Answer:   Raylene Everts [[id number]]   Symptoms consistent for UTI Patient unable to go to UC in person at  this time for urine analysis Take antibiotic as directed and to completion Follow up with PCP if symptoms persists Follow up in person or go to ER if you have any new or worsening symptoms such as fever, worsening abdominal pain, nausea/vomiting, flank pain, etc...   I discussed the assessment and treatment plan with the patient. The patient was provided an opportunity to ask questions and all were answered. The patient agreed with the plan and demonstrated an understanding of the instructions.   The patient was advised to call back or seek an in-person evaluation if the symptoms worsen or if the condition fails to improve as anticipated.  I provided 10 minutes of non-face-to-face time during this encounter.  Donna Box, PA-C  08/31/2019 11:44 AM      Donna Box, PA-C 08/31/19 1144

## 2019-09-17 MED FILL — CLINDAMYCIN PHOSPHATE 1 % G: 1 | 30 days supply | Qty: 60 | Fill #1

## 2019-09-20 MED FILL — clonazePAM 0.5 MG TABS: 0.5 | 30 days supply | Qty: 30 | Fill #2

## 2019-10-19 DIAGNOSIS — Z1231 Encounter for screening mammogram for malignant neoplasm of breast: Secondary | ICD-10-CM | POA: Diagnosis not present

## 2019-11-01 DIAGNOSIS — Z01419 Encounter for gynecological examination (general) (routine) without abnormal findings: Secondary | ICD-10-CM | POA: Diagnosis not present

## 2019-11-01 DIAGNOSIS — Z6837 Body mass index (BMI) 37.0-37.9, adult: Secondary | ICD-10-CM | POA: Diagnosis not present

## 2019-11-01 DIAGNOSIS — N926 Irregular menstruation, unspecified: Secondary | ICD-10-CM | POA: Diagnosis not present

## 2019-11-17 DIAGNOSIS — N926 Irregular menstruation, unspecified: Secondary | ICD-10-CM | POA: Diagnosis not present

## 2019-11-17 DIAGNOSIS — N939 Abnormal uterine and vaginal bleeding, unspecified: Secondary | ICD-10-CM | POA: Diagnosis not present

## 2019-11-25 ENCOUNTER — Encounter: Payer: 59 | Admitting: Internal Medicine

## 2019-12-06 ENCOUNTER — Other Ambulatory Visit: Payer: Self-pay | Admitting: Internal Medicine

## 2019-12-06 MED FILL — clonazePAM 0.5 MG TABS: 0.5 | 30 days supply | Qty: 30 | Fill #0

## 2019-12-22 DIAGNOSIS — L718 Other rosacea: Secondary | ICD-10-CM | POA: Diagnosis not present

## 2019-12-29 ENCOUNTER — Ambulatory Visit: Payer: 59 | Admitting: Internal Medicine

## 2019-12-29 ENCOUNTER — Other Ambulatory Visit: Payer: Self-pay

## 2019-12-29 ENCOUNTER — Encounter: Payer: Self-pay | Admitting: Internal Medicine

## 2019-12-29 DIAGNOSIS — H9201 Otalgia, right ear: Secondary | ICD-10-CM | POA: Diagnosis not present

## 2019-12-29 NOTE — Assessment & Plan Note (Signed)
Acute Itching, discomfort and some blood in the right ear Exam does reveal some thick, darker wax that is nonobstructing but there is a good amount and that is likely irritating her ear canal.  No obvious bleeding, but blood could have been from attempting to clean out the ear Advised her to try warm water with 50% hydrogen peroxide and clean out the ear No evidence of infection so no treatment needed Can always refer to ENT if she wants-the ear is always giving her some problems

## 2019-12-29 NOTE — Progress Notes (Signed)
Subjective:    Patient ID: Donna Robbins, female    DOB: Feb 28, 1971, 49 y.o.   MRN: [id number]  HPI The patient is here for an acute visit.  She states left ear itching, slight discomfort and she did see some blood when she tried to clean out the ear.  That year it has always given her some problems and she was just concerned she had an infection or why there was some blood.  She denies any cold symptoms.  She does have her chronic allergy symptoms which are typical for her.  She denies any changes in hearing, fevers headaches or dizziness.  She does try to periodically clean out her ear and typically uses a Kleenex on her fingertip.   Medications and allergies reviewed with patient and updated if appropriate.  Patient Active Problem List   Diagnosis Date Noted  . Chronic pain of left knee 04/15/2017  . Hx of adenomatous polyp of colon 07/18/2016  . Ventral hernia 11/15/2015  . Routine general medical examination at a health care facility 11/24/2014  . Anxiety   . HEARING LOSS 03/14/2010  . Obesity 03/06/2010  . IRRITABLE BOWEL SYNDROME 03/06/2010    Current Outpatient Medications on File Prior to Visit  Medication Sig Dispense Refill  . cholecalciferol (VITAMIN D) 1000 units tablet Take 5,000 Units by mouth daily.     . clonazePAM (KLONOPIN) 0.5 MG tablet TAKE 1/2 TO 1 TABLET BY MOUTH DAILY AS NEEDED FOR ANXIETY 30 tablet 0  . diclofenac sodium (VOLTAREN) 1 % GEL Apply 2 g topically 4 (four) times daily. Rub into affected area of foot 2 to 4 times daily 100 g 2  . ferrous sulfate 325 (65 FE) MG EC tablet Take 325 mg by mouth daily.     . Multiple Vitamin (MULTIVITAMIN) capsule Take 1 capsule by mouth daily.    . vitamin B-12 (CYANOCOBALAMIN) 1000 MCG tablet      No current facility-administered medications on file prior to visit.    Past Medical History:  Diagnosis Date  . Allergy   . Anxiety   . Depression   . GERD (gastroesophageal reflux disease)   . HEARING  LOSS   . Hx of adenomatous polyp of colon 07/18/2016  . Irritable bowel syndrome   . OBESITY     Past Surgical History:  Procedure Laterality Date  . CESAREAN SECTION     x's 2  . CHOLECYSTECTOMY  2009  . ENT surgery  2004    Social History   Socioeconomic History  . Marital status: Married    Spouse name: Not on file  . Number of children: 2  . Years of education: Not on file  . Highest education level: Not on file  Occupational History  . Occupation: Counsellor: [employer]  Tobacco Use  . Smoking status: Never Smoker  . Smokeless tobacco: Never Used  Substance and Sexual Activity  . Alcohol use: Yes    Comment: Rarely  . Drug use: No  . Sexual activity: Not on file  Other Topics Concern  . Not on file  Social History Narrative  . Not on file   Social Determinants of Health   Financial Resource Strain:   . Difficulty of Paying Living Expenses: Not on file  Food Insecurity:   . Worried About Charity fundraiser in the Last Year: Not on file  . Ran Out of Food in the Last Year: Not  on file  Transportation Needs:   . Lack of Transportation (Medical): Not on file  . Lack of Transportation (Non-Medical): Not on file  Physical Activity:   . Days of Exercise per Week: Not on file  . Minutes of Exercise per Session: Not on file  Stress:   . Feeling of Stress : Not on file  Social Connections:   . Frequency of Communication with Friends and Family: Not on file  . Frequency of Social Gatherings with Friends and Family: Not on file  . Attends Religious Services: Not on file  . Active Member of Clubs or Organizations: Not on file  . Attends Archivist Meetings: Not on file  . Marital Status: Not on file    Family History  Problem Relation Age of Onset  . Cancer Father        Angio sarcoma  . Hyperlipidemia Father   . Diabetes Mother   . Hyperlipidemia Mother   . Arthritis Maternal Grandmother   . Lung cancer  Maternal Grandmother   . Arthritis Other   . Lung cancer Maternal Grandfather   . Colon cancer Neg Hx   . Stomach cancer Neg Hx   . Rectal cancer Neg Hx   . Esophageal cancer Neg Hx   . Liver cancer Neg Hx     Review of Systems Per HPI    Objective:   Vitals:   12/29/19 1609  BP: 114/80  Pulse: 97  Temp: 98.6 F (37 C)  SpO2: 97%   BP Readings from Last 3 Encounters:  12/29/19 114/80  12/04/18 111/76  11/24/18 122/86   Wt Readings from Last 3 Encounters:  11/24/18 198 lb (89.8 kg)  08/01/17 186 lb (84.4 kg)  05/13/17 182 lb (82.6 kg)   Body mass index is 35.07 kg/m.   Physical Exam Constitutional:      General: She is not in acute distress.    Appearance: Normal appearance. She is not ill-appearing.  HENT:     Head: Normocephalic and atraumatic.     Right Ear: Tympanic membrane and external ear normal. There is no impacted cerumen (Moderate cerumen along floor of ear canal that is dark in color, but soft, nonobstructing).     Left Ear: Tympanic membrane, ear canal and external ear normal. There is no impacted cerumen (Minimal cerumen, nonobstructing).     Ears:     Comments: No visible blood or bleeding in right ear Skin:    General: Skin is warm and dry.  Neurological:     Mental Status: She is alert.            Assessment & Plan:    See Problem List for Assessment and Plan of chronic medical problems.    This visit occurred during the SARS-CoV-2 public health emergency.  Safety protocols were in place, including screening questions prior to the visit, additional usage of staff PPE, and extensive cleaning of exam room while observing appropriate contact time as indicated for disinfecting solutions.

## 2019-12-29 NOTE — Patient Instructions (Signed)
Try cleaning out your right ear.

## 2019-12-30 ENCOUNTER — Ambulatory Visit: Payer: 59 | Admitting: Internal Medicine

## 2020-02-07 ENCOUNTER — Ambulatory Visit (INDEPENDENT_AMBULATORY_CARE_PROVIDER_SITE_OTHER): Payer: 59

## 2020-02-07 ENCOUNTER — Other Ambulatory Visit: Payer: Self-pay

## 2020-02-07 ENCOUNTER — Ambulatory Visit (INDEPENDENT_AMBULATORY_CARE_PROVIDER_SITE_OTHER): Payer: 59 | Admitting: Internal Medicine

## 2020-02-07 ENCOUNTER — Encounter: Payer: Self-pay | Admitting: Internal Medicine

## 2020-02-07 VITALS — BP 118/88 | HR 86 | Temp 99.0°F | Ht 63.0 in | Wt 209.0 lb

## 2020-02-07 DIAGNOSIS — F419 Anxiety disorder, unspecified: Secondary | ICD-10-CM

## 2020-02-07 DIAGNOSIS — M25562 Pain in left knee: Secondary | ICD-10-CM

## 2020-02-07 DIAGNOSIS — Z0000 Encounter for general adult medical examination without abnormal findings: Secondary | ICD-10-CM | POA: Diagnosis not present

## 2020-02-07 DIAGNOSIS — G8929 Other chronic pain: Secondary | ICD-10-CM | POA: Diagnosis not present

## 2020-02-07 DIAGNOSIS — Z23 Encounter for immunization: Secondary | ICD-10-CM

## 2020-02-07 LAB — LIPID PANEL
Cholesterol: 144 mg/dL (ref 0–200)
HDL: 49 mg/dL (ref >39.00)
LDL Cholesterol: 82 mg/dL (ref 0–99)
NonHDL: 94.59
Total CHOL/HDL Ratio: 3
Triglycerides: 65 mg/dL (ref 0.0–149.0)
VLDL: 13 mg/dL (ref 0.0–40.0)

## 2020-02-07 LAB — COMPREHENSIVE METABOLIC PANEL
ALT: 16 U/L (ref 0–35)
AST: 18 U/L (ref 0–37)
Albumin: 4.2 g/dL (ref 3.5–5.2)
Alkaline Phosphatase: 49 U/L (ref 39–117)
BUN: 9 mg/dL (ref 6–23)
CO2: 30 mEq/L (ref 19–32)
Calcium: 9.2 mg/dL (ref 8.4–10.5)
Chloride: 104 mEq/L (ref 96–112)
Creatinine, Ser: 0.61 mg/dL (ref 0.40–1.20)
GFR: 104.26 mL/min (ref >60.00)
Glucose, Bld: 85 mg/dL (ref 70–99)
Potassium: 4 mEq/L (ref 3.5–5.1)
Sodium: 140 mEq/L (ref 135–145)
Total Bilirubin: 0.4 mg/dL (ref 0.2–1.2)
Total Protein: 7.5 g/dL (ref 6.0–8.3)

## 2020-02-07 LAB — CBC
HCT: 40.2 % (ref 36.0–46.0)
Hemoglobin: 13.3 g/dL (ref 12.0–15.0)
MCHC: 33.1 g/dL (ref 30.0–36.0)
MCV: 94.2 fl (ref 78.0–100.0)
Platelets: 314 10*3/uL (ref 150.0–400.0)
RBC: 4.27 Mil/uL (ref 3.87–5.11)
RDW: 13 % (ref 11.5–15.5)
WBC: 6.3 10*3/uL (ref 4.0–10.5)

## 2020-02-07 LAB — HEMOGLOBIN A1C: Hgb A1c MFr Bld: 5.7 % (ref 4.6–6.5)

## 2020-02-07 NOTE — Assessment & Plan Note (Signed)
Ordered x-ray and depending on results proceed to further evaluation.

## 2020-02-07 NOTE — Assessment & Plan Note (Signed)
Flu shot up to date. Covid-19 2 shots with booster recommended. Shingrix 1st given today. Tetanus up to date. Colonoscopy up to date. Mammogram up to date, pap smear up to date. Counseled about sun safety and mole surveillance. Counseled about the dangers of distracted driving. Given 10 year screening recommendations.

## 2020-02-07 NOTE — Progress Notes (Signed)
   Subjective:   Patient ID: Donna Robbins, female    DOB: 1970/09/05, 49 y.o.   MRN: [id number]  HPI The patient is a 49 YO female coming in for physical. Persistent left knee issues and would like imaging to evaluate.   PMH, [facility], social history reviewed and updated  Review of Systems  Constitutional: Negative.   HENT: Negative.   Eyes: Negative.   Respiratory: Negative for cough, chest tightness and shortness of breath.   Cardiovascular: Negative for chest pain, palpitations and leg swelling.  Gastrointestinal: Negative for abdominal distention, abdominal pain, constipation, diarrhea, nausea and vomiting.  Musculoskeletal: Positive for arthralgias.  Skin: Negative.   Neurological: Negative.   Psychiatric/Behavioral: Negative.     Objective:  Physical Exam Constitutional:      Appearance: She is well-developed.  HENT:     Head: Normocephalic and atraumatic.  Cardiovascular:     Rate and Rhythm: Normal rate and regular rhythm.  Pulmonary:     Effort: Pulmonary effort is normal. No respiratory distress.     Breath sounds: Normal breath sounds. No wheezing or rales.  Abdominal:     General: Bowel sounds are normal. There is no distension.     Palpations: Abdomen is soft.     Tenderness: There is no abdominal tenderness. There is no rebound.  Musculoskeletal:     Cervical back: Normal range of motion.  Skin:    General: Skin is warm and dry.  Neurological:     Mental Status: She is alert and oriented to person, place, and time.     Coordination: Coordination normal.     Vitals:   02/07/20 0818  BP: 118/88  Pulse: 86  Temp: 99 F (37.2 C)  TempSrc: Oral  SpO2: 97%  Weight: 209 lb (94.8 kg)  Height: 5\' 3"  (1.6 m)    This visit occurred during the SARS-CoV-2 public health emergency.  Safety protocols were in place, including screening questions prior to the visit, additional usage of staff PPE, and extensive cleaning of exam room while observing appropriate  contact time as indicated for disinfecting solutions.   Assessment & Plan:  Shingrix IM given at visit

## 2020-02-07 NOTE — Patient Instructions (Signed)
Health Maintenance, Female Adopting a healthy lifestyle and getting preventive care are important in promoting health and wellness. Ask your health care provider about:  The right schedule for you to have regular tests and exams.  Things you can do on your own to prevent diseases and keep yourself healthy. What should I know about diet, weight, and exercise? Eat a healthy diet   Eat a diet that includes plenty of vegetables, fruits, low-fat dairy products, and lean protein.  Do not eat a lot of foods that are high in solid fats, added sugars, or sodium. Maintain a healthy weight Body mass index (BMI) is used to identify weight problems. It estimates body fat based on height and weight. Your health care provider can help determine your BMI and help you achieve or maintain a healthy weight. Get regular exercise Get regular exercise. This is one of the most important things you can do for your health. Most adults should:  Exercise for at least 150 minutes each week. The exercise should increase your heart rate and make you sweat (moderate-intensity exercise).  Do strengthening exercises at least twice a week. This is in addition to the moderate-intensity exercise.  Spend less time sitting. Even light physical activity can be beneficial. Watch cholesterol and blood lipids Have your blood tested for lipids and cholesterol at 50 years of age, then have this test every 5 years. Have your cholesterol levels checked more often if:  Your lipid or cholesterol levels are high.  You are older than 50 years of age.  You are at high risk for heart disease. What should I know about cancer screening? Depending on your health history and family history, you may need to have cancer screening at various ages. This may include screening for:  Breast cancer.  Cervical cancer.  Colorectal cancer.  Skin cancer.  Lung cancer. What should I know about heart disease, diabetes, and high blood  pressure? Blood pressure and heart disease  High blood pressure causes heart disease and increases the risk of stroke. This is more likely to develop in people who have high blood pressure readings, are of African descent, or are overweight.  Have your blood pressure checked: ? Every 3-5 years if you are 18-39 years of age. ? Every year if you are 40 years old or older. Diabetes Have regular diabetes screenings. This checks your fasting blood sugar level. Have the screening done:  Once every three years after age 40 if you are at a normal weight and have a low risk for diabetes.  More often and at a younger age if you are overweight or have a high risk for diabetes. What should I know about preventing infection? Hepatitis B If you have a higher risk for hepatitis B, you should be screened for this virus. Talk with your health care provider to find out if you are at risk for hepatitis B infection. Hepatitis C Testing is recommended for:  Everyone born from 1945 through 1965.  Anyone with known risk factors for hepatitis C. Sexually transmitted infections (STIs)  Get screened for STIs, including gonorrhea and chlamydia, if: ? You are sexually active and are younger than 50 years of age. ? You are older than 50 years of age and your health care provider tells you that you are at risk for this type of infection. ? Your sexual activity has changed since you were last screened, and you are at increased risk for chlamydia or gonorrhea. Ask your health care provider if   you are at risk.  Ask your health care provider about whether you are at high risk for HIV. Your health care provider may recommend a prescription medicine to help prevent HIV infection. If you choose to take medicine to prevent HIV, you should first get tested for HIV. You should then be tested every 3 months for as long as you are taking the medicine. Pregnancy  If you are about to stop having your period (premenopausal) and  you may become pregnant, seek counseling before you get pregnant.  Take 400 to 800 micrograms (mcg) of folic acid every day if you become pregnant.  Ask for birth control (contraception) if you want to prevent pregnancy. Osteoporosis and menopause Osteoporosis is a disease in which the bones lose minerals and strength with aging. This can result in bone fractures. If you are 65 years old or older, or if you are at risk for osteoporosis and fractures, ask your health care provider if you should:  Be screened for bone loss.  Take a calcium or vitamin D supplement to lower your risk of fractures.  Be given hormone replacement therapy (HRT) to treat symptoms of menopause. Follow these instructions at home: Lifestyle  Do not use any products that contain nicotine or tobacco, such as cigarettes, e-cigarettes, and chewing tobacco. If you need help quitting, ask your health care provider.  Do not use street drugs.  Do not share needles.  Ask your health care provider for help if you need support or information about quitting drugs. Alcohol use  Do not drink alcohol if: ? Your health care provider tells you not to drink. ? You are pregnant, may be pregnant, or are planning to become pregnant.  If you drink alcohol: ? Limit how much you use to 0-1 drink a day. ? Limit intake if you are breastfeeding.  Be aware of how much alcohol is in your drink. In the [place] one drink equals one 12 oz bottle of beer (355 mL), one 5 oz glass of wine (148 mL), or one 1 oz glass of hard liquor (44 mL). General instructions  Schedule regular health, dental, and eye exams.  Stay current with your vaccines.  Tell your health care provider if: ? You often feel depressed. ? You have ever been abused or do not feel safe at home. Summary  Adopting a healthy lifestyle and getting preventive care are important in promoting health and wellness.  Follow your health care provider's instructions about healthy  diet, exercising, and getting tested or screened for diseases.  Follow your health care provider's instructions on monitoring your cholesterol and blood pressure. This information is not intended to replace advice given to you by your health care provider. Make sure you discuss any questions you have with your health care provider. Document Revised: 03/04/2018 Document Reviewed: 03/04/2018 Elsevier Patient Education  2020 Elsevier Inc.  

## 2020-02-07 NOTE — Assessment & Plan Note (Signed)
Rare clonazepam and okay with refill. She understands risk and benefit and wishes to continue.

## 2020-02-28 ENCOUNTER — Encounter: Payer: Self-pay | Admitting: Internal Medicine

## 2020-02-29 ENCOUNTER — Encounter: Payer: Self-pay | Admitting: Internal Medicine

## 2020-03-01 ENCOUNTER — Encounter: Payer: Self-pay | Admitting: Internal Medicine

## 2020-03-13 ENCOUNTER — Ambulatory Visit: Payer: 59 | Attending: Internal Medicine

## 2020-03-13 DIAGNOSIS — Z23 Encounter for immunization: Secondary | ICD-10-CM

## 2020-03-13 NOTE — Progress Notes (Signed)
° °  Covid-19 Vaccination Clinic  Name:  Donna Robbins    MRN: [id number] DOB: 25-Nov-1970  03/13/2020  [name] was observed post Covid-19 immunization for 15 minutes without incident. She was provided with Vaccine Information Sheet and instruction to access the V-Safe system.   [name] was instructed to call 911 with any severe reactions post vaccine:  Difficulty breathing   Swelling of face and throat   A fast heartbeat   A bad rash all over body   Dizziness and weakness   Immunizations Administered    Name Date Dose VIS Date Route   Pfizer COVID-19 Vaccine 03/13/2020  3:24 PM 0.3 mL 01/12/2020 Intramuscular   Manufacturer: [place]   Lot: [id number]   [place]: [id number]

## 2020-03-16 ENCOUNTER — Encounter: Payer: Self-pay | Admitting: Internal Medicine

## 2020-03-16 DIAGNOSIS — H938X1 Other specified disorders of right ear: Secondary | ICD-10-CM

## 2020-03-21 ENCOUNTER — Other Ambulatory Visit (HOSPITAL_COMMUNITY): Payer: Self-pay | Admitting: Dermatology

## 2020-03-21 MED FILL — CLINDAMYCIN PHOSPHATE 1 % G: 1 | 30 days supply | Qty: 60 | Fill #0

## 2020-04-04 ENCOUNTER — Ambulatory Visit: Payer: 59 | Admitting: Internal Medicine

## 2020-04-10 ENCOUNTER — Ambulatory Visit: Payer: 59

## 2020-04-12 ENCOUNTER — Other Ambulatory Visit: Payer: Self-pay

## 2020-04-13 ENCOUNTER — Other Ambulatory Visit: Payer: Self-pay

## 2020-04-13 ENCOUNTER — Ambulatory Visit (INDEPENDENT_AMBULATORY_CARE_PROVIDER_SITE_OTHER): Payer: 59

## 2020-04-13 DIAGNOSIS — Z23 Encounter for immunization: Secondary | ICD-10-CM | POA: Diagnosis not present

## 2020-04-20 ENCOUNTER — Encounter: Payer: Self-pay | Admitting: Internal Medicine

## 2020-05-05 NOTE — Progress Notes (Signed)
Subjective:    CC: Neck, R shoulder and R elbow pain  I, Molly Weber, LAT, ATC, am serving as scribe for Dr. Lynne Leader.  HPI: Pt is a 50 y/o RHD female presenting w/ c/o neck, R shoulder and R elbow pain.  She had R shoulder pain for approximately 2-3 years but has begun to have some R-sided neck pain x the last 3 months w/ no known MOI.  Also, about a month ago she slipped on her steps and hit her R forearm that is now causing radiating pain from her R elbow to her R wrist.  her most irritating symptom is her R elbow/forearm and then the neck and last the shoulder. 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  Radiating pain: UE numbness/tingling: No UE weakness: yes in her R forearm Aggravating factors: mousing; computer work; lifting; repetitive activity Treatments tried: Tylenol; IBU; heat;   Diagnostic testing: c-spine XR- 08/13/13  Pertinent review of Systems: No fevers or chills  Relevant historical information: IBS   Objective:    Vitals:   05/08/20 0800  BP: 110/82  Pulse: 87  SpO2: 97%   General: Well Developed, well nourished, and in no acute distress.   MSK: C-spine normal-appearing Nontender midline.  Tender palpation right trapezius and cervical paraspinal musculature. Decreased cervical motion especially the lateral flexion. Upper extremity strength and sensation are equal and normal throughout.  Right shoulder normal-appearing Nontender. Normal motion. Normal strength. Minimally positive Hawkins and Neer's test.  Negative Yergason's and speeds test.  Right elbow normal-appearing Normal motion. Normal strength. Tender palpation lateral epicondyle. Pain with resisted wrist extension and hand extension.  Nontender flexion.  Minimally tender supination nontender pronation.  Pulses capillary refill and sensation are intact distally.   Lab and Radiology Results  X-ray images C-spine and right elbow pain today personally and  independently interpreted  C-spine: Loss of cervical lordosis.  No significant degenerative changes.  No fractures.  Right elbow: No fractures or malalignment.  Minimal degenerative changes.  Await formal radiology review     Impression and Recommendations:    Assessment and Plan: 50 y.o. female with   Right lateral neck pain due to cervical muscle spasm and dysfunction.  This has been ongoing now for approximately 3 months.  Plan for TENS unit heating pad and physical therapy.  Recheck back in about 6 weeks.  Return sooner if needed.  Right lateral elbow pain due to lateral epicondylitis.  Patient does have a mild trauma history.  Plan for physical therapy as well.  Recommend also Voltaren gel and wrist brace as needed.  Check back in 6 weeks.Marland Kitchen  PDMP not reviewed this encounter. Orders Placed This Encounter  Procedures  . DG ELBOW COMPLETE RIGHT (3+VIEW)    Standing Status:   Future    Standing Expiration Date:   05/08/2021    Order Specific Question:   Reason for Exam (SYMPTOM  OR DIAGNOSIS REQUIRED)    Answer:   eval elbow r    Order Specific Question:   Is patient pregnant?    Answer:   No    Order Specific Question:   Preferred imaging location?    Answer:   [facility]  . DG Cervical Spine 2 or 3 views    Standing Status:   Future    Standing Expiration Date:   05/08/2021    Order Specific Question:   Reason for Exam (SYMPTOM  OR DIAGNOSIS REQUIRED)    Answer:  eval cspine    Order Specific Question:   Is patient pregnant?    Answer:   No    Order Specific Question:   Preferred imaging location?    Answer:   [facility]  . Ambulatory referral to Physical Therapy    Referral Priority:   Routine    Referral Type:   Physical Medicine    Referral Reason:   Specialty Services Required    Requested Specialty:   Physical Therapy   No orders of the defined types were placed in this encounter.   Discussed warning signs or symptoms. Please see discharge  instructions. Patient expresses understanding.   The above documentation has been reviewed and is accurate and complete Lynne Leader, M.D.

## 2020-05-08 ENCOUNTER — Ambulatory Visit: Payer: Self-pay

## 2020-05-08 ENCOUNTER — Other Ambulatory Visit: Payer: Self-pay

## 2020-05-08 ENCOUNTER — Ambulatory Visit (INDEPENDENT_AMBULATORY_CARE_PROVIDER_SITE_OTHER): Payer: 59

## 2020-05-08 ENCOUNTER — Encounter: Payer: Self-pay | Admitting: Family Medicine

## 2020-05-08 ENCOUNTER — Ambulatory Visit: Payer: 59 | Admitting: Family Medicine

## 2020-05-08 VITALS — BP 110/82 | HR 87 | Ht 63.0 in | Wt 212.2 lb

## 2020-05-08 DIAGNOSIS — M25511 Pain in right shoulder: Secondary | ICD-10-CM

## 2020-05-08 DIAGNOSIS — M79631 Pain in right forearm: Secondary | ICD-10-CM

## 2020-05-08 DIAGNOSIS — M7711 Lateral epicondylitis, right elbow: Secondary | ICD-10-CM

## 2020-05-08 DIAGNOSIS — G8929 Other chronic pain: Secondary | ICD-10-CM | POA: Diagnosis not present

## 2020-05-08 DIAGNOSIS — M25551 Pain in right hip: Secondary | ICD-10-CM | POA: Diagnosis not present

## 2020-05-08 DIAGNOSIS — M542 Cervicalgia: Secondary | ICD-10-CM

## 2020-05-08 NOTE — Progress Notes (Signed)
Elbow x-ray shows no fractures.  No arthritis.

## 2020-05-08 NOTE — Progress Notes (Signed)
X-ray cervical spine looks normal to radiology.  There is some evidence of muscle spasm with some straightening of the neck but no other significant findings

## 2020-05-08 NOTE — Patient Instructions (Addendum)
Thank you for coming in today.  I've referred you to Physical Therapy.  Let us know if you don't hear from them in one week.  Please use voltaren gel up to 4x daily for pain as needed.   Heating pad will be helpful for the neck.   TENS UNIT: This is helpful for muscle pain and spasm.   Search and Purchase a TENS 7000 2nd edition at  www.tenspros.com or www.[email or web address].com It should be less than $30.     TENS unit instructions: Do not shower or bathe with the unit on . Turn the unit off before removing electrodes or batteries . If the electrodes lose stickiness add a drop of water to the electrodes after they are disconnected from the unit and place on plastic sheet. If you continued to have difficulty, call the TENS unit company to purchase more electrodes. . Do not apply lotion on the skin area prior to use. Make sure the skin is clean and dry as this will help prolong the life of the electrodes. . After use, always check skin for unusual red areas, rash or other skin difficulties. If there are any skin problems, does not apply electrodes to the same area. . Never remove the electrodes from the unit by pulling the wires. . Do not use the TENS unit or electrodes other than as directed. . Do not change electrode placement without consultating your therapist or physician. Marland Kitchen Keep 2 fingers with between each electrode. . Wear time ratio is 2:1, on to off times.    For example on for 30 minutes off for 15 minutes and then on for 30 minutes off for 15 minutes    A wrist brace will help some.  Recheck with me in 6 weeks.   Let me know if this is not working.   Please get an Xray today before you leave

## 2020-05-16 ENCOUNTER — Encounter: Payer: Self-pay | Admitting: Rehabilitative and Restorative Service Providers"

## 2020-05-16 ENCOUNTER — Ambulatory Visit: Payer: 59 | Admitting: Rehabilitative and Restorative Service Providers"

## 2020-05-16 ENCOUNTER — Other Ambulatory Visit: Payer: Self-pay

## 2020-05-16 DIAGNOSIS — M25511 Pain in right shoulder: Secondary | ICD-10-CM

## 2020-05-16 DIAGNOSIS — M6281 Muscle weakness (generalized): Secondary | ICD-10-CM | POA: Diagnosis not present

## 2020-05-16 DIAGNOSIS — G8929 Other chronic pain: Secondary | ICD-10-CM | POA: Diagnosis not present

## 2020-05-16 DIAGNOSIS — M542 Cervicalgia: Secondary | ICD-10-CM | POA: Diagnosis not present

## 2020-05-16 DIAGNOSIS — R293 Abnormal posture: Secondary | ICD-10-CM | POA: Diagnosis not present

## 2020-05-16 DIAGNOSIS — M25521 Pain in right elbow: Secondary | ICD-10-CM

## 2020-05-16 NOTE — Patient Instructions (Signed)
Access Code: [email or web address] URL: [email or web address] Date: 05/16/2020 Prepared by: Vista Mink  Exercises Standing Scapular Retraction - 5 x daily - 7 x weekly - 1 sets - 5 reps - 5 second hold Wrist Flexion Extension AROM with Fingers Curled and Palm Down - 2 x daily - 7 x weekly - 2 sets - 30 reps

## 2020-05-16 NOTE — Therapy (Signed)
[facility] Physical Therapy [address] [place], [place], [address] Phone: [phone]   Fax:  [phone]  Physical Therapy Evaluation  Patient Details  Name: Donna Robbins MRN: [id number] Date of Birth: 05-16-1970 Referring Provider (PT): Gregor Hams MD   Encounter Date: 05/16/2020   PT End of Session - 05/16/20 1315    Visit Number 1    Number of Visits 16    Authorization Type NA    Progress Note Due on Visit 10    PT Start Time 0933    PT Stop Time 1015    PT Time Calculation (min) 42 min    Activity Tolerance Patient tolerated treatment well;No increased pain    Behavior During Therapy WFL for tasks assessed/performed           Past Medical History:  Diagnosis Date  . Allergy   . Anxiety   . Depression   . GERD (gastroesophageal reflux disease)   . HEARING LOSS   . Hx of adenomatous polyp of colon 07/18/2016  . Irritable bowel syndrome   . OBESITY     Past Surgical History:  Procedure Laterality Date  . CESAREAN SECTION     x's 2  . CHOLECYSTECTOMY  2009  . ENT surgery  2004    There were no vitals filed for this visit.    Subjective Assessment - 05/16/20 1309    Subjective Donna Robbins has had chronic mild to moderate R shoulder pain and more recent neck and R elbow pain.  She is able to function but finds symptoms "annoying" and she would like them to improve before they get worse.    Limitations Sitting;Reading;Lifting    Patient Stated Goals Have less aggravation with R UE use and less upper back and neck pain with work and ADLs.    Currently in Pain? Yes    Pain Score 3     Pain Location Elbow    Pain Orientation Right    Pain Descriptors / Indicators Aching;Sore    Pain Type Acute pain    Pain Radiating Towards NA    Pain Onset More than a month ago    Pain Frequency Intermittent    Aggravating Factors  Grip and R UE use    Pain Relieving Factors Rest    Effect of Pain on Daily Activities Endurance with R sided UE use  is limited    Multiple Pain Sites Yes    Pain Score 3    Pain Location Neck    Pain Descriptors / Indicators Aching;Tightness    Pain Type Chronic pain    Pain Radiating Towards NA    Pain Onset More than a month ago    Pain Frequency Intermittent    Aggravating Factors  Prolonged postures    Pain Relieving Factors Movement and postural correction    Effect of Pain on Daily Activities Aggravates sitting and flexed postures    Pain Score 2    Pain Location Shoulder    Pain Orientation Right    Pain Descriptors / Indicators Sore    Pain Type Chronic pain    Pain Radiating Towards NA    Pain Onset More than a month ago    Pain Frequency Intermittent    Aggravating Factors  Lifting and reaching    Effect of Pain on Daily Activities Has to be aware of how she uses her R shoulder              [facility] PT Assessment -  05/16/20 0001      Assessment   Medical Diagnosis R tennis elbow, R shoulder pain and neck pain    Referring Provider (PT) Gregor Hams MD    Onset Date/Surgical Date --   ~ 1 month elbow and ~ 3 months neck   Next MD Visit late March early April      [place]   Has the patient fallen in the past 6 months Yes    How many times? 1    Has the patient had a decrease in activity level because of a fear of falling?  No    Is the patient reluctant to leave their home because of a fear of falling?  No      Prior Function   Level of Independence Independent      Cognition   Overall Cognitive Status Within Functional Limits for tasks assessed      Observation/Other Assessments   Focus on Therapeutic Outcomes (FOTO)  55 (77 Goal)      Posture/Postural Control   Posture/Postural Control Postural limitations    Postural Limitations Forward head;Rounded Shoulders;Decreased lumbar lordosis      ROM / Strength   AROM / PROM / Strength AROM;Strength      AROM   Overall AROM  Deficits    AROM Assessment Site Cervical;Shoulder;Elbow    Right/Left Shoulder  Left;Right    Right Shoulder Flexion 170 Degrees    Right Shoulder Internal Rotation 70 Degrees    Right Shoulder External Rotation 105 Degrees    Right Shoulder Horizontal  ADduction 30 Degrees    Left Shoulder Flexion 170 Degrees    Left Shoulder Internal Rotation 70 Degrees    Left Shoulder External Rotation 90 Degrees    Left Shoulder Horizontal ADduction 50 Degrees    Right/Left Elbow Left;Right    Right Elbow Flexion 138    Right Elbow Extension -3    Left Elbow Flexion 140    Left Elbow Extension -3    Cervical Extension 70    Cervical - Right Rotation 60    Cervical - Left Rotation 60      Strength   Overall Strength Deficits    Strength Assessment Site Shoulder;Elbow;Cervical    Right/Left Shoulder Left;Right    Right Shoulder Internal Rotation 5/5    Right Shoulder External Rotation 4+/5    Left Shoulder Internal Rotation 5/5    Left Shoulder External Rotation 5/5    Right/Left Elbow Left;Right    Right Elbow Flexion --   64.5 pounds Grip   Left Elbow Flexion --   Grip 74 pounds Grip   Cervical Extension 5/5    Cervical - Right Side Bend 4-/5    Cervical - Left Side Bend 4-/5                      Objective measurements completed on examination: See above findings.       [facility] Adult PT Treatment/Exercise - 05/16/20 0001      Therapeutic Activites    Therapeutic Activities Other Therapeutic Activities    Other Therapeutic Activities Discussed the importance of posture and postural awareness for the neck, anatomy of the shoulder and importance of avoiding too much gripping/grasping with the elbow.  Reviewed exam findings and explained POC, plan and expected outcomes.      Exercises   Exercises Neck;Shoulder;Elbow      Elbow Exercises   Other elbow exercises Drop eccentrics (palm down, forearm resting on  armrest of chair, slowly control eccentric descent and use R hand to actively bring back to starting position) 30X with 2#      Shoulder  Exercises: Standing   Retraction Strengthening;Both;10 reps;Other (comment)    Retraction Limitations 5 seconds shoulder blade pinches                  PT Education - 05/16/20 1314    Education Details Spent quite a bit of time reviewing exam findings (3 areas), discussing anatomy, posture and the importance of avoiding overuse.    Person(s) Educated Patient    Methods Explanation;Demonstration;Verbal cues;Handout    Comprehension Verbalized understanding;Returned demonstration;Verbal cues required;Need further instruction            PT Short Term Goals - 05/16/20 1321      PT SHORT TERM GOAL #1   Title Donna Robbins will be independent with her starter HEP.    Time 4    Period Weeks    Status New    Target Date 06/16/20      PT SHORT TERM GOAL #2   Title Donna Robbins will have improved cervical, shoulder and grip strength as compared to evaluation.    Baseline See objective.    Time 4    Period Weeks    Status New    Target Date 06/16/20             PT Long Term Goals - 05/16/20 1323      PT LONG TERM GOAL #1   Title Improve FOTO score to 77.    Baseline 55    Time 8    Period Weeks    Status New    Target Date 07/14/20      PT LONG TERM GOAL #2   Title Donna Robbins will report neck, R shoulder and R elbow pain at consistently 0-2/10.    Baseline Can be 3-4/10    Time 8    Period Weeks    Status New    Target Date 07/14/20      PT LONG TERM GOAL #3   Title Improve R grip strength to at least 100% of the uninvolved L.    Baseline See objective    Time 8    Period Weeks    Status New    Target Date 07/14/20      PT LONG TERM GOAL #4   Title Donna Robbins will be independent with her long-term HEP at DC.    Time 8    Period Weeks    Status New    Target Date 07/14/20                  Plan - 05/16/20 1317    Clinical Impression Statement Donna Robbins has had shoulder soreness and mild to moderate pain for some time.  The neck and elbow have been more recent.  She appears to  have some mild R shoulder posterior capsular tightness and mild strength deficits.  R elbow pain will be addressed by wrist extensors strengthening to allow her to grip and do normal activities with her R UE without pain.  Neck pain appears to be postural and will be addressed with scapular, postural and cervical strengthening along with education.  Prognosis is excellent with all 3 joints.    Personal Factors and Comorbidities Comorbidity 1    Comorbidities 3 issues at one    Examination-Activity Limitations Carry;Reach Overhead    Examination-Participation Restrictions Occupation;Community Activity    Stability/Clinical Decision Making Stable/Uncomplicated  Clinical Decision Making Moderate    Rehab Potential Good    PT Frequency 2x / week    PT Duration 8 weeks    PT Treatment/Interventions ADLs/Self Care Home Management;Moist Heat;Cryotherapy;Therapeutic activities;Therapeutic exercise;Neuromuscular re-education;Patient/family education;Manual techniques;Dry needling    PT Next Visit Plan Progress scapular and rotator cuff strengthening.  Progress upper back and cervical strength.  Continue drop eccentrics and R lateral elbow strength work while avoiding overuse.    PT Home Exercise Plan Access Code: [email or web address]    Consulted and Agree with Plan of Care Patient           Patient will benefit from skilled therapeutic intervention in order to improve the following deficits and impairments:  Decreased activity tolerance,Decreased endurance,Decreased strength,Hypomobility,Increased edema,Impaired UE functional use,Postural dysfunction,Improper body mechanics,Pain,Obesity  Visit Diagnosis: Muscle weakness (generalized)  Pain in right elbow  Chronic right shoulder pain  Abnormal posture  Cervicalgia     Problem List Patient Active Problem List   Diagnosis Date Noted  . Chronic pain of left knee 04/15/2017  . Hx of adenomatous polyp of colon 07/18/2016  . Ventral hernia 11/15/2015   . Routine general medical examination at a health care facility 11/24/2014  . Anxiety   . HEARING LOSS 03/14/2010  . Obesity 03/06/2010  . IRRITABLE BOWEL SYNDROME 03/06/2010    Farley Ly PT, MPT 05/16/2020, 1:26 PM  [facility] Physical Therapy [address] [place], [place], [address] Phone: [phone]   Fax:  [phone]  Name: Donna Robbins MRN: [id number] Date of Birth: 1970/08/30

## 2020-05-17 ENCOUNTER — Ambulatory Visit (INDEPENDENT_AMBULATORY_CARE_PROVIDER_SITE_OTHER): Payer: 59 | Admitting: Otolaryngology

## 2020-05-17 ENCOUNTER — Other Ambulatory Visit: Payer: Self-pay | Admitting: Family

## 2020-05-18 ENCOUNTER — Other Ambulatory Visit: Payer: Self-pay | Admitting: Internal Medicine

## 2020-05-18 ENCOUNTER — Telehealth: Payer: Self-pay | Admitting: Rehabilitative and Restorative Service Providers"

## 2020-05-18 ENCOUNTER — Encounter: Payer: 59 | Admitting: Rehabilitative and Restorative Service Providers"

## 2020-05-18 MED FILL — clonazePAM 0.5 MG TABS: 0.5 | 30 days supply | Qty: 30 | Fill #0

## 2020-05-18 NOTE — Telephone Encounter (Signed)
[employer].  She forgot.  She will see me 3/1 at 8 AM.

## 2020-05-23 ENCOUNTER — Other Ambulatory Visit: Payer: Self-pay

## 2020-05-23 ENCOUNTER — Encounter: Payer: Self-pay | Admitting: Rehabilitative and Restorative Service Providers"

## 2020-05-23 ENCOUNTER — Ambulatory Visit (INDEPENDENT_AMBULATORY_CARE_PROVIDER_SITE_OTHER): Payer: 59 | Admitting: Rehabilitative and Restorative Service Providers"

## 2020-05-23 DIAGNOSIS — M25521 Pain in right elbow: Secondary | ICD-10-CM | POA: Diagnosis not present

## 2020-05-23 DIAGNOSIS — M25511 Pain in right shoulder: Secondary | ICD-10-CM

## 2020-05-23 DIAGNOSIS — M542 Cervicalgia: Secondary | ICD-10-CM

## 2020-05-23 DIAGNOSIS — R293 Abnormal posture: Secondary | ICD-10-CM

## 2020-05-23 DIAGNOSIS — M6281 Muscle weakness (generalized): Secondary | ICD-10-CM | POA: Diagnosis not present

## 2020-05-23 DIAGNOSIS — G8929 Other chronic pain: Secondary | ICD-10-CM | POA: Diagnosis not present

## 2020-05-23 NOTE — Therapy (Signed)
[place], [place], [address] Phone: [phone]   Fax:  [phone]  Physical Therapy Treatment  Patient Details  Name: Donna Robbins MRN: [id number] Date of Birth: May 17, 1970 Referring Provider (PT): Gregor Hams MD   Encounter Date: 05/23/2020   PT End of Session - 05/23/20 0855    Visit Number 2    Number of Visits 16    Authorization Type NA    Progress Note Due on Visit 10    PT Start Time 0801    PT Stop Time 0841    PT Time Calculation (min) 40 min    Activity Tolerance Patient tolerated treatment well    Behavior During Therapy [facility] for tasks assessed/performed           Past Medical History:  Diagnosis Date  . Allergy   . Anxiety   . Depression   . GERD (gastroesophageal reflux disease)   . HEARING LOSS   . Hx of adenomatous polyp of colon 07/18/2016  . Irritable bowel syndrome   . OBESITY     Past Surgical History:  Procedure Laterality Date  . CESAREAN SECTION     x's 2  . CHOLECYSTECTOMY  2009  . ENT surgery  2004    There were no vitals filed for this visit.   Subjective Assessment - 05/23/20 0806    Subjective Pt. indicated feeling like some of the shoulder/neck pain has shown helpful and she also reported being off work was helpful as well.  Pt. stated "100%" of the time the wrist exercise makes pain worse.  Pt. stated ache starts during the exercise and hurts afterward (similar type pain as she gets with daily life actions).    Limitations Sitting;Reading;Lifting    Patient Stated Goals Have less aggravation with R UE use and less upper back and neck pain with work and ADLs.    Currently in Pain? Yes    Pain Score 4     Pain Location Elbow   forearm posterior   Pain Orientation Posterior;Right    Pain Descriptors / Indicators Aching    Pain Onset More than a month ago    Pain Score 0    Pain Descriptors / Indicators Aching    Pain Type Chronic pain    Pain Onset More than a  month ago    Aggravating Factors  daily use, exercise.    Pain Relieving Factors avoiding activity    Pain Score 0    Pain Onset More than a month ago                             [facility] Adult PT Treatment/Exercise - 05/23/20 0001      Exercises   Exercises Other Exercises    Other Exercises  HEP review, education on adjustments based off symptom presentation(weight level).      Elbow Exercises   Other elbow exercises eccentric wrist extensors 3 x 10 1lb (adjusted to to symptom response)      Shoulder Exercises: Standing   Row Theraband   green 3 x 10   Theraband Level (Shoulder Row) Level 3 (Green)    Retraction 5 reps   review from HEP     Shoulder Exercises: Stretch   Other Shoulder Stretches Wrist extensor stretch outstretched elbow 20 sec x 5      Modalities   Modalities Moist Heat      Moist Heat  Therapy   Number Minutes Moist Heat 5 Minutes   Lt posterior forearm c stretching     Manual Therapy   Manual therapy comments compression to Rt extensor group TrP (noted concordant pain in area from palpation today)            Trigger Point Dry Needling - 05/23/20 0001    Consent Given? Yes    Education Handout Provided Yes    Muscles Treated Wrist/Hand Extensor digitorum;Extensor carpi radialis longus/brevis    Extensor carpi radialis longus/brevis Response Twitch response elicited    Extensor digitorum Response Twitch response elicited                PT Education - 05/23/20 0850    Education Details Additions to HEP per handout, DN education and review.    Person(s) Educated Patient    Methods Explanation;Demonstration;Verbal cues;Handout    Comprehension Returned demonstration;Verbalized understanding            PT Short Term Goals - 05/23/20 0851      PT SHORT TERM GOAL #1   Title Donna Robbins will be independent with her starter HEP.    Time 4    Period Weeks    Status On-going    Target Date 06/16/20      PT SHORT TERM GOAL #2    Title Donna Robbins will have improved cervical, shoulder and grip strength as compared to evaluation.    Baseline See objective.    Time 4    Period Weeks    Status On-going    Target Date 06/16/20             PT Long Term Goals - 05/16/20 1323      PT LONG TERM GOAL #1   Title Improve FOTO score to 77.    Baseline 55    Time 8    Period Weeks    Status New    Target Date 07/14/20      PT LONG TERM GOAL #2   Title Donna Robbins will report neck, R shoulder and R elbow pain at consistently 0-2/10.    Baseline Can be 3-4/10    Time 8    Period Weeks    Status New    Target Date 07/14/20      PT LONG TERM GOAL #3   Title Improve R grip strength to at least 100% of the uninvolved L.    Baseline See objective    Time 8    Period Weeks    Status New    Target Date 07/14/20      PT LONG TERM GOAL #4   Title Donna Robbins will be independent with her long-term HEP at DC.    Time 8    Period Weeks    Status New    Target Date 07/14/20                 Plan - 05/23/20 0851    Clinical Impression Statement Of note today, Pt. indicated continued difficulty c daily activity and HEP with Rt forearm complaints.  During palpation assessment today, Pt. vocalized strong concordant symptoms c noted TrP in extensor group in area.  Pt. agreed to and selected to perform trigger point release techniques including compression as well as trigger point dry needling.  Successful performance c good overall tolerance.  Pt. indicated reduced complaints in eccentric extension exercise post manual intervention and with adjustment in resistance.  Cues for progresison of weight resistance based off symptom presentation.  Pt. may benefit from continued skilled PT services c trigger point release reassessment next visit.    Personal Factors and Comorbidities Comorbidity 1    Comorbidities 3 issues at one    Examination-Activity Limitations Carry;Reach Overhead    Examination-Participation Restrictions Occupation;Community  Activity    Stability/Clinical Decision Making Stable/Uncomplicated    Rehab Potential Good    PT Frequency 2x / week    PT Duration 8 weeks    PT Treatment/Interventions ADLs/Self Care Home Management;Moist Heat;Cryotherapy;Therapeutic activities;Therapeutic exercise;Neuromuscular re-education;Patient/family education;Manual techniques;Dry needling    PT Next Visit Plan Reassess needling/trigger point release response.  Scapular/cervicothoracic muscle activation/strengthening/endurance.    PT Home Exercise Plan Access Code: [email or web address]    Consulted and Agree with Plan of Care Patient           Patient will benefit from skilled therapeutic intervention in order to improve the following deficits and impairments:  Decreased activity tolerance,Decreased endurance,Decreased strength,Hypomobility,Increased edema,Impaired UE functional use,Postural dysfunction,Improper body mechanics,Pain,Obesity  Visit Diagnosis: Chronic right shoulder pain  Cervicalgia  Pain in right elbow  Muscle weakness (generalized)  Abnormal posture     Problem List Patient Active Problem List   Diagnosis Date Noted  . Chronic pain of left knee 04/15/2017  . Hx of adenomatous polyp of colon 07/18/2016  . Ventral hernia 11/15/2015  . Routine general medical examination at a health care facility 11/24/2014  . Anxiety   . HEARING LOSS 03/14/2010  . Obesity 03/06/2010  . IRRITABLE BOWEL SYNDROME 03/06/2010    Scot Jun, PT, DPT, OCS, ATC 05/23/20  9:04 AM    [facility] Physical Therapy [address] [place], [place], [address] Phone: [phone]   Fax:  [phone]  Name: Donna Robbins MRN: [id number] Date of Birth: [birth year]

## 2020-05-23 NOTE — Patient Instructions (Signed)
Access Code: [email or web address] URL: [email or web address] Date: 05/23/2020 Prepared by: Scot Jun  Exercises Standing Scapular Retraction - 5 x daily - 7 x weekly - 1 sets - 5 reps - 5 second hold Wrist Flexion Extension AROM with Fingers Curled and Palm Down - 2 x daily - 7 x weekly - 2 sets - 30 reps Standing Wrist Flexion Stretch - 2 x daily - 7 x weekly - 1 sets - 5 reps - 20-30 hold Standing Shoulder Row with Anchored Resistance - 2 x daily - 7 x weekly - 10 reps - 3 sets  Patient Education Trigger Point Dry Needling

## 2020-05-25 ENCOUNTER — Other Ambulatory Visit: Payer: Self-pay

## 2020-05-25 ENCOUNTER — Ambulatory Visit: Payer: 59 | Admitting: Rehabilitative and Restorative Service Providers"

## 2020-05-25 ENCOUNTER — Encounter: Payer: Self-pay | Admitting: Rehabilitative and Restorative Service Providers"

## 2020-05-25 DIAGNOSIS — G8929 Other chronic pain: Secondary | ICD-10-CM

## 2020-05-25 DIAGNOSIS — M25511 Pain in right shoulder: Secondary | ICD-10-CM

## 2020-05-25 DIAGNOSIS — R293 Abnormal posture: Secondary | ICD-10-CM | POA: Diagnosis not present

## 2020-05-25 DIAGNOSIS — M6281 Muscle weakness (generalized): Secondary | ICD-10-CM | POA: Diagnosis not present

## 2020-05-25 DIAGNOSIS — M542 Cervicalgia: Secondary | ICD-10-CM | POA: Diagnosis not present

## 2020-05-25 DIAGNOSIS — M25521 Pain in right elbow: Secondary | ICD-10-CM | POA: Diagnosis not present

## 2020-05-25 NOTE — Therapy (Signed)
[place], [place], [address] Phone: [phone]   Fax:  [phone]  Physical Therapy Treatment  Patient Details  Name: Donna Robbins MRN: [id number] Date of Birth: 10-Nov-1970 Referring Provider (PT): Gregor Hams MD   Encounter Date: 05/25/2020   PT End of Session - 05/25/20 1043    Visit Number 3    Number of Visits 16    Date for PT Re-Evaluation 07/14/20    Authorization Type NA    Progress Note Due on Visit 10    PT Start Time 0539    PT Stop Time 1054    PT Time Calculation (min) 39 min    Activity Tolerance Patient tolerated treatment well    Behavior During Therapy [facility] for tasks assessed/performed           Past Medical History:  Diagnosis Date  . Allergy   . Anxiety   . Depression   . GERD (gastroesophageal reflux disease)   . HEARING LOSS   . Hx of adenomatous polyp of colon 07/18/2016  . Irritable bowel syndrome   . OBESITY     Past Surgical History:  Procedure Laterality Date  . CESAREAN SECTION     x's 2  . CHOLECYSTECTOMY  2009  . ENT surgery  2004    There were no vitals filed for this visit.   Subjective Assessment - 05/25/20 1017    Subjective Pt. stated she felt like she has more movement out of wrist since last visit.  Pt. stated she was pretty sore after the visit.   Pt. stated a little better in original pain complaints since last visit.    Limitations Sitting;Reading;Lifting    Patient Stated Goals Have less aggravation with R UE use and less upper back and neck pain with work and ADLs.    Currently in Pain? Yes    Pain Score 3     Pain Location Elbow   forearm posterior   Pain Orientation Right;Posterior    Pain Descriptors / Indicators Aching;Sore    Pain Type Acute pain    Pain Onset More than a month ago    Pain Frequency Intermittent    Aggravating Factors  grip and desk work activity    Pain Relieving Factors needling helped some    Pain Score 0    Pain Onset  More than a month ago    Pain Onset More than a month ago                             [facility] Adult PT Treatment/Exercise - 05/25/20 0001      Elbow Exercises   Other elbow exercises eccentric wrist extensors 3 x 10 2 lbs    Other elbow exercises seated 2 lb supination/pronation hammer like twist 2 x 15, wrist extensor stretch 15 sec x 3 Rt UE      Shoulder Exercises: Standing   External Rotation Right   3 x 10 green   Theraband Level (Shoulder External Rotation) Level 3 (Green)    Internal Rotation Left   3 x 10 green   Theraband Level (Shoulder Internal Rotation) Level 3 (Green)      Manual Therapy   Manual therapy comments compression to Rt extensor group with movement, percussive device to same                    PT Short Term Goals -  05/23/20 0851      PT SHORT TERM GOAL #1   Title Donna Robbins will be independent with her starter HEP.    Time 4    Period Weeks    Status On-going    Target Date 06/16/20      PT SHORT TERM GOAL #2   Title Donna Robbins will have improved cervical, shoulder and grip strength as compared to evaluation.    Baseline See objective.    Time 4    Period Weeks    Status On-going    Target Date 06/16/20             PT Long Term Goals - 05/16/20 1323      PT LONG TERM GOAL #1   Title Improve FOTO score to 77.    Baseline 55    Time 8    Period Weeks    Status New    Target Date 07/14/20      PT LONG TERM GOAL #2   Title Donna Robbins will report neck, R shoulder and R elbow pain at consistently 0-2/10.    Baseline Can be 3-4/10    Time 8    Period Weeks    Status New    Target Date 07/14/20      PT LONG TERM GOAL #3   Title Improve R grip strength to at least 100% of the uninvolved L.    Baseline See objective    Time 8    Period Weeks    Status New    Target Date 07/14/20      PT LONG TERM GOAL #4   Title Donna Robbins will be independent with her long-term HEP at DC.    Time 8    Period Weeks    Status New    Target Date  07/14/20                 Plan - 05/25/20 1021    Clinical Impression Statement Similar presenation noted today compared to last visit c palpation assessment.  Pt. did voice benefit from extensor group myofascial release techniques from last visit.  Progressed UE strengthening c good tolerance today.    Personal Factors and Comorbidities Comorbidity 1    Comorbidities 3 issues at one    Examination-Activity Limitations Carry;Reach Overhead    Examination-Participation Restrictions Occupation;Community Activity    Stability/Clinical Decision Making Stable/Uncomplicated    Rehab Potential Good    PT Frequency 2x / week    PT Duration 8 weeks    PT Treatment/Interventions ADLs/Self Care Home Management;Moist Heat;Cryotherapy;Therapeutic activities;Therapeutic exercise;Neuromuscular re-education;Patient/family education;Manual techniques;Dry needling    PT Next Visit Plan Perform myofascial release techniques (Pt. preference of DN, vs compression/percussive device)  Scapular/cervicothoracic muscle activation/strengthening/endurance.    PT Home Exercise Plan Access Code: [email or web address]    Consulted and Agree with Plan of Care Patient           Patient will benefit from skilled therapeutic intervention in order to improve the following deficits and impairments:  Decreased activity tolerance,Decreased endurance,Decreased strength,Hypomobility,Increased edema,Impaired UE functional use,Postural dysfunction,Improper body mechanics,Pain,Obesity  Visit Diagnosis: Chronic right shoulder pain  Cervicalgia  Pain in right elbow  Muscle weakness (generalized)  Abnormal posture     Problem List Patient Active Problem List   Diagnosis Date Noted  . Chronic pain of left knee 04/15/2017  . Hx of adenomatous polyp of colon 07/18/2016  . Ventral hernia 11/15/2015  . Routine general medical examination at a health care facility 11/24/2014  . Anxiety   .  HEARING LOSS 03/14/2010  . Obesity  03/06/2010  . IRRITABLE BOWEL SYNDROME 03/06/2010   Scot Jun, PT, DPT, OCS, ATC 05/25/20  10:49 AM    [facility] Physical Therapy [address] [place], [place], [address] Phone: [phone]   Fax:  [phone]  Name: Donna Robbins MRN: [id number] Date of Birth: [birth year]

## 2020-05-30 ENCOUNTER — Encounter: Payer: Self-pay | Admitting: Rehabilitative and Restorative Service Providers"

## 2020-05-30 ENCOUNTER — Other Ambulatory Visit: Payer: Self-pay

## 2020-05-30 ENCOUNTER — Ambulatory Visit: Payer: 59 | Admitting: Rehabilitative and Restorative Service Providers"

## 2020-05-30 DIAGNOSIS — M6281 Muscle weakness (generalized): Secondary | ICD-10-CM | POA: Diagnosis not present

## 2020-05-30 DIAGNOSIS — M542 Cervicalgia: Secondary | ICD-10-CM | POA: Diagnosis not present

## 2020-05-30 DIAGNOSIS — M25511 Pain in right shoulder: Secondary | ICD-10-CM | POA: Diagnosis not present

## 2020-05-30 DIAGNOSIS — G8929 Other chronic pain: Secondary | ICD-10-CM | POA: Diagnosis not present

## 2020-05-30 DIAGNOSIS — R293 Abnormal posture: Secondary | ICD-10-CM | POA: Diagnosis not present

## 2020-05-30 DIAGNOSIS — M25521 Pain in right elbow: Secondary | ICD-10-CM | POA: Diagnosis not present

## 2020-05-30 NOTE — Therapy (Signed)
[place], [place], [address] Phone: [phone]   Fax:  [phone]  Physical Therapy Treatment  Patient Details  Name: Donna Robbins MRN: [id number] Date of Birth: [birth year] Referring Provider (PT): Gregor Hams MD   Encounter Date: 05/30/2020   PT End of Session - 05/30/20 0755    Visit Number 4    Number of Visits 16    Date for PT Re-Evaluation 07/14/20    Authorization Type NA    Progress Note Due on Visit 10    PT Start Time 0758    PT Stop Time 0838    PT Time Calculation (min) 40 min    Activity Tolerance Patient tolerated treatment well    Behavior During Therapy [facility] for tasks assessed/performed           Past Medical History:  Diagnosis Date  . Allergy   . Anxiety   . Depression   . GERD (gastroesophageal reflux disease)   . HEARING LOSS   . Hx of adenomatous polyp of colon 07/18/2016  . Irritable bowel syndrome   . OBESITY     Past Surgical History:  Procedure Laterality Date  . CESAREAN SECTION     x's 2  . CHOLECYSTECTOMY  2009  . ENT surgery  2004    There were no vitals filed for this visit.   Subjective Assessment - 05/30/20 0800    Subjective Pt. indicated the weekend was good, felt some stiffness after working in the yard some. Stiffness in morning seems to work out with some movement.   Pt. stated variable pain level from 3-4/10 when noticed, achy usually.  Pt. stated she believes some of Rt neck complaints (upper trap) can be due to altered movement.  Pt. indicated she felt improvement with needling and also percussion/compression.    Limitations Sitting;Reading;Lifting    Patient Stated Goals Have less aggravation with R UE use and less upper back and neck pain with work and ADLs.    Currently in Pain? Yes    Pain Score 3     Pain Location Elbow   posterior elbow/forearm/back of hand   Pain Orientation Right;Posterior    Pain Descriptors / Indicators Aching;Sore    Pain  Type Acute pain    Pain Onset More than a month ago    Aggravating Factors  stiffness in morning, general ache    Pain Onset More than a month ago    Pain Onset More than a month ago              [facility] PT Assessment - 05/30/20 0001      Assessment   Medical Diagnosis R tennis elbow, R shoulder pain and neck pain    Referring Provider (PT) Gregor Hams MD      Palpation   Palpation comment Concordant posterior forearm symptoms c TrP noted in extensor group Rt.                         [facility] Adult PT Treatment/Exercise - 05/30/20 0001      Elbow Exercises   Elbow Extension Right   blue band eccentric focus 3 x 10   Other elbow exercises eccentric wrist extensors 3 x 10 3 lbs    Other elbow exercises seated 3 lb supination/pronation hammer like twist 2 x 15, Wrist extensor/flexor stretch outstretched elbow 30 sec x 3      Shoulder Exercises: Standing  Row 20 reps;Theraband   scap retraction pause 2 seconds   Theraband Level (Shoulder Row) Level 4 (Blue)    Retraction Other (comment)   incorporated for HEP, into row exercise   Other Standing Exercises wall push up c SA press 2 sec hold x 15      Manual Therapy   Manual therapy comments compression to Rt extensor group with movement, percussive device to same                    PT Short Term Goals - 05/23/20 0851      PT SHORT TERM GOAL #1   Title Donna Robbins will be independent with her starter HEP.    Time 4    Period Weeks    Status On-going    Target Date 06/16/20      PT SHORT TERM GOAL #2   Title Donna Robbins will have improved cervical, shoulder and grip strength as compared to evaluation.    Baseline See objective.    Time 4    Period Weeks    Status On-going    Target Date 06/16/20             PT Long Term Goals - 05/16/20 1323      PT LONG TERM GOAL #1   Title Improve FOTO score to 77.    Baseline 55    Time 8    Period Weeks    Status New    Target Date 07/14/20      PT LONG TERM  GOAL #2   Title Donna Robbins will report neck, R shoulder and R elbow pain at consistently 0-2/10.    Baseline Can be 3-4/10    Time 8    Period Weeks    Status New    Target Date 07/14/20      PT LONG TERM GOAL #3   Title Improve R grip strength to at least 100% of the uninvolved L.    Baseline See objective    Time 8    Period Weeks    Status New    Target Date 07/14/20      PT LONG TERM GOAL #4   Title Donna Robbins will be independent with her long-term HEP at [place].    Time 8    Period Weeks    Status New    Target Date 07/14/20                 Plan - 05/30/20 0818    Clinical Impression Statement Pt. has reported responding well to manual intervention for myofascial release techniques (dry needling, compression, percussive device).  Pt. does continue to exhibit complaints on posterior forearm/hand at this time that indicate necessity for continued skilled PT services.  Making progression in eccentric loading to extensors as well as other UE strengthening c good tolerance.    Personal Factors and Comorbidities Comorbidity 1    Comorbidities 3 issues at one    Examination-Activity Limitations Carry;Reach Overhead    Examination-Participation Restrictions Occupation;Community Activity    Stability/Clinical Decision Making Stable/Uncomplicated    Rehab Potential Good    PT Frequency 2x / week    PT Duration 8 weeks    PT Treatment/Interventions ADLs/Self Care Home Management;Moist Heat;Cryotherapy;Therapeutic activities;Therapeutic exercise;Neuromuscular re-education;Patient/family education;Manual techniques;Dry needling    PT Next Visit Plan Perform myofascial release techniques (Pt. preference of DN, vs compression/percussive device),  Scapular/cervicothoracic muscle activation/strengthening/endurance c continuation of wrist eccentric loading, triceps loading.    PT Home Exercise Plan  Access Code: [email or web address]    Consulted and Agree with Plan of Care Patient           Patient will  benefit from skilled therapeutic intervention in order to improve the following deficits and impairments:  Decreased activity tolerance,Decreased endurance,Decreased strength,Hypomobility,Increased edema,Impaired UE functional use,Postural dysfunction,Improper body mechanics,Pain,Obesity  Visit Diagnosis: Chronic right shoulder pain  Cervicalgia  Pain in right elbow  Muscle weakness (generalized)  Abnormal posture     Problem List Patient Active Problem List   Diagnosis Date Noted  . Chronic pain of left knee 04/15/2017  . Hx of adenomatous polyp of colon 07/18/2016  . Ventral hernia 11/15/2015  . Routine general medical examination at a health care facility 11/24/2014  . Anxiety   . HEARING LOSS 03/14/2010  . Obesity 03/06/2010  . IRRITABLE BOWEL SYNDROME 03/06/2010    Scot Jun, PT, DPT, OCS, ATC 05/30/20  8:35 AM    [facility] Physical Therapy [address] [place], [place], [address] Phone: [phone]   Fax:  [phone]  Name: Donna Robbins MRN: [id number] Date of Birth: 04/08/1970

## 2020-06-01 ENCOUNTER — Encounter: Payer: Self-pay | Admitting: Rehabilitative and Restorative Service Providers"

## 2020-06-01 ENCOUNTER — Other Ambulatory Visit: Payer: Self-pay

## 2020-06-01 ENCOUNTER — Ambulatory Visit: Payer: 59 | Admitting: Rehabilitative and Restorative Service Providers"

## 2020-06-01 DIAGNOSIS — M25511 Pain in right shoulder: Secondary | ICD-10-CM | POA: Diagnosis not present

## 2020-06-01 DIAGNOSIS — G8929 Other chronic pain: Secondary | ICD-10-CM

## 2020-06-01 DIAGNOSIS — R293 Abnormal posture: Secondary | ICD-10-CM

## 2020-06-01 DIAGNOSIS — M25521 Pain in right elbow: Secondary | ICD-10-CM

## 2020-06-01 DIAGNOSIS — M542 Cervicalgia: Secondary | ICD-10-CM | POA: Diagnosis not present

## 2020-06-01 DIAGNOSIS — M6281 Muscle weakness (generalized): Secondary | ICD-10-CM | POA: Diagnosis not present

## 2020-06-01 NOTE — Therapy (Signed)
[place], [place], [address] Phone: [phone]   Fax:  [phone]  Physical Therapy Treatment  Patient Details  Name: HOUSTON SURGES MRN: [id number] Date of Birth: December 24, 1970 Referring Provider (PT): Gregor Hams MD   Encounter Date: 06/01/2020   PT End of Session - 06/01/20 1717    Visit Number 5    Number of Visits 16    Date for PT Re-Evaluation 07/14/20    Authorization Type NA    Progress Note Due on Visit 10    PT Start Time 1020    PT Stop Time 1100    PT Time Calculation (min) 40 min    Activity Tolerance Patient tolerated treatment well    Behavior During Therapy [facility] for tasks assessed/performed           Past Medical History:  Diagnosis Date  . Allergy   . Anxiety   . Depression   . GERD (gastroesophageal reflux disease)   . HEARING LOSS   . Hx of adenomatous polyp of colon 07/18/2016  . Irritable bowel syndrome   . OBESITY     Past Surgical History:  Procedure Laterality Date  . CESAREAN SECTION     x's 2  . CHOLECYSTECTOMY  2009  . ENT surgery  2004    There were no vitals filed for this visit.   Subjective Assessment - 06/01/20 1712    Subjective Delsa Sale notes that she is feeling improvements since starting physical therapy.  Her shoulder has been minimally irritating.  Cervical pain can be reduced and managed by postural correction and exercises.  Her tennis elbow is improving and remains most functionally limiting.    Limitations Sitting;Reading;Lifting    Patient Stated Goals Have less aggravation with R UE use and less upper back and neck pain with work and ADLs.    Pain Score 3     Pain Location Elbow    Pain Orientation Right;Lateral    Pain Descriptors / Indicators Aching;Sore;Tender    Pain Type Acute pain    Pain Onset More than a month ago    Pain Frequency Intermittent    Aggravating Factors  Overuse    Pain Relieving Factors Getting stronger    Effect of Pain on  Daily Activities Endurance with R UE use is limited    Multiple Pain Sites No    Pain Onset More than a month ago    Pain Onset More than a month ago                             [facility] Adult PT Treatment/Exercise - 06/01/20 0001      Posture/Postural Control   Posture/Postural Control Postural limitations    Postural Limitations Forward head;Rounded Shoulders;Decreased lumbar lordosis      Exercises   Exercises Neck;Shoulder;Elbow      Elbow Exercises   Other elbow exercises Drop eccentrics (palm down, forearm resting on armrest of chair, slowly control eccentric descent and use R hand to actively bring back to starting position) 20X with 2# and 20X with 3#    Other elbow exercises Wrist flexors stretching (include thumb) 5X 10 seconds      Neck Exercises: Machines for Strengthening   UBE (Upper Arm Bike) 5 minutes pull only :30 Pull/:30 Rest Level 5      Neck Exercises: Seated   Cervical Isometrics Extension;5 secs;10 reps;Other (comment)   Perfect posture  Shoulder Exercises: Standing   External Rotation Strengthening;Right;10 reps;Theraband;Other (comment)   slow eccentrics with wrist in neutral 2 sets   Theraband Level (Shoulder External Rotation) Level 3 (Green)    Row Strengthening;Both;20 reps;Theraband;Other (comment)    Theraband Level (Shoulder Row) Level 4 (Blue)    Retraction Strengthening;Both;10 reps;Other (comment)    Retraction Limitations 5 seconds shoulder blade pinches    Other Standing Exercises Posterior capsule stretch 5X 10 seconds                  PT Education - 06/01/20 1716    Education Details Focused on wrist position with any gripping exercises (theraband).    Person(s) Educated Patient    Methods Explanation;Demonstration;Verbal cues    Comprehension Verbal cues required;Returned demonstration;Verbalized understanding            PT Short Term Goals - 06/01/20 1716      PT SHORT TERM GOAL #1   Title Delsa Sale will be  independent with her starter HEP.    Time 4    Period Weeks    Status Achieved    Target Date 06/16/20      PT SHORT TERM GOAL #2   Title Delsa Sale will have improved cervical, shoulder and grip strength as compared to evaluation.    Baseline See objective.    Time 4    Period Weeks    Status On-going    Target Date 06/16/20             PT Long Term Goals - 06/01/20 1717      PT LONG TERM GOAL #1   Title Improve FOTO score to 77.    Baseline 55    Time 8    Period Weeks    Status On-going      PT LONG TERM GOAL #2   Title Delsa Sale will report neck, R shoulder and R elbow pain at consistently 0-2/10.    Baseline Can be 3-4/10    Time 8    Period Weeks    Status Partially Met      PT LONG TERM GOAL #3   Title Improve R grip strength to at least 100% of the uninvolved L.    Baseline See objective    Time 8    Period Weeks    Status On-going      PT LONG TERM GOAL #4   Title Delsa Sale will be independent with her long-term HEP at DC.    Time 8    Period Weeks    Status On-going                 Plan - 06/01/20 1718    Clinical Impression Statement Delsa Sale reports minimal shoulder and neck pain over the past week.  Elbow symptoms are improving and remain most functionally limiting.  Increasing postural, cervical and wrist extensor strength along with avoiding overuse remains the focus of continued work.    Personal Factors and Comorbidities Comorbidity 1    Comorbidities 3 issues at one    Examination-Activity Limitations Carry;Reach Overhead    Examination-Participation Restrictions Occupation;Community Activity    Stability/Clinical Decision Making Stable/Uncomplicated    Rehab Potential Good    PT Frequency 2x / week    PT Duration 8 weeks    PT Treatment/Interventions ADLs/Self Care Home Management;Moist Heat;Cryotherapy;Therapeutic activities;Therapeutic exercise;Neuromuscular re-education;Patient/family education;Manual techniques;Dry needling    PT Next Visit Plan  Perform myofascial release techniques (Pt. preference of DN, vs compression/percussive device),  Scapular/cervicothoracic muscle activation/strengthening/endurance  c continuation of wrist eccentric loading, triceps loading.    PT Home Exercise Plan Access Code: [email or web address]    Consulted and Agree with Plan of Care Patient           Patient will benefit from skilled therapeutic intervention in order to improve the following deficits and impairments:  Decreased activity tolerance,Decreased endurance,Decreased strength,Hypomobility,Increased edema,Impaired UE functional use,Postural dysfunction,Improper body mechanics,Pain,Obesity  Visit Diagnosis: Pain in right elbow  Muscle weakness (generalized)  Abnormal posture  Cervicalgia  Chronic right shoulder pain     Problem List Patient Active Problem List   Diagnosis Date Noted  . Chronic pain of left knee 04/15/2017  . Hx of adenomatous polyp of colon 07/18/2016  . Ventral hernia 11/15/2015  . Routine general medical examination at a health care facility 11/24/2014  . Anxiety   . HEARING LOSS 03/14/2010  . Obesity 03/06/2010  . IRRITABLE BOWEL SYNDROME 03/06/2010    Farley Ly PT, MPT 06/01/2020, 5:20 PM  [facility] Physical Therapy [address] [place], [place], [address] Phone: [phone]   Fax:  [phone]  Name: MAIRIM BADE MRN: [id number] Date of Birth: [birth year]

## 2020-06-05 ENCOUNTER — Ambulatory Visit (INDEPENDENT_AMBULATORY_CARE_PROVIDER_SITE_OTHER): Payer: 59 | Admitting: Otolaryngology

## 2020-06-05 ENCOUNTER — Other Ambulatory Visit: Payer: Self-pay

## 2020-06-05 ENCOUNTER — Encounter (INDEPENDENT_AMBULATORY_CARE_PROVIDER_SITE_OTHER): Payer: Self-pay | Admitting: Otolaryngology

## 2020-06-05 VITALS — Temp 97.2°F

## 2020-06-05 DIAGNOSIS — H6123 Impacted cerumen, bilateral: Secondary | ICD-10-CM

## 2020-06-05 NOTE — Progress Notes (Signed)
HPI: DANAIJA ESKRIDGE is a 50 y.o. female who presents is referred by her PCP for evaluation of ear complaints.  She complains of the right ear being chronically clogged and itching sometimes.  She has had occasional pain in the right ear.  She is always had a problem with wax buildup and has used hydroperoxide ear irrigations.  She last cleaned her ears about a month ago.  She is having more blockage on the right ear but no active drainage noted..  Past Medical History:  Diagnosis Date  . Allergy   . Anxiety   . Depression   . GERD (gastroesophageal reflux disease)   . HEARING LOSS   . Hx of adenomatous polyp of colon 07/18/2016  . Irritable bowel syndrome   . OBESITY    Past Surgical History:  Procedure Laterality Date  . CESAREAN SECTION     x's 2  . CHOLECYSTECTOMY  2009  . ENT surgery  2004   Social History   Socioeconomic History  . Marital status: Married    Spouse name: Not on file  . Number of children: 2  . Years of education: Not on file  . Highest education level: Not on file  Occupational History  . Occupation: Counsellor: [employer]  Tobacco Use  . Smoking status: Never Smoker  . Smokeless tobacco: Never Used  Substance and Sexual Activity  . Alcohol use: Yes    Comment: Rarely  . Drug use: No  . Sexual activity: Not on file  Other Topics Concern  . Not on file  Social History Narrative  . Not on file   Social Determinants of Health   Financial Resource Strain: Not on file  Food Insecurity: Not on file  Transportation Needs: Not on file  Physical Activity: Not on file  Stress: Not on file  Social Connections: Not on file   Family History  Problem Relation Age of Onset  . Cancer Father        Angio sarcoma  . Hyperlipidemia Father   . Diabetes Mother   . Hyperlipidemia Mother   . Arthritis Maternal Grandmother   . Lung cancer Maternal Grandmother   . Arthritis Other   . Lung cancer Maternal Grandfather    . Colon cancer Neg Hx   . Stomach cancer Neg Hx   . Rectal cancer Neg Hx   . Esophageal cancer Neg Hx   . Liver cancer Neg Hx    Allergies  Allergen Reactions  . Iodine     REACTION: Itching  . Sulfonamide Derivatives     REACTION: Itching Rash   Prior to Admission medications   Medication Sig Start Date End Date Taking? Authorizing Provider  cholecalciferol (VITAMIN D) 1000 units tablet Take 5,000 Units by mouth daily.     [provider]  clonazePAM (KLONOPIN) 0.5 MG tablet TAKE 1/2 TO 1 TABLET BY MOUTH DAILY AS NEEDED FOR ANXIETY 05/18/20   Hoyt Koch, MD  diclofenac sodium (VOLTAREN) 1 % GEL Apply 2 g topically 4 (four) times daily. Rub into affected area of foot 2 to 4 times daily 12/04/18   Trula Slade, DPM  ferrous sulfate 325 (65 FE) MG EC tablet Take 325 mg by mouth daily.     [provider]  Multiple Vitamin (MULTIVITAMIN) capsule Take 1 capsule by mouth daily.    [provider]  vitamin B-12 (CYANOCOBALAMIN) 1000 MCG tablet  11/23/17   [provider]     Positive ROS: Otherwise negative  All other systems have been reviewed and were otherwise negative with the exception of those mentioned in the HPI and as above.  Physical Exam: Constitutional: Alert, well-appearing, no acute distress Ears: External ears without lesions or tenderness.  She has normal-appearing wax in both ears but a lot more wax on the right side compared to the left.  Both ear canals were cleaned with hydroperoxide and suction.  Ear canals were otherwise clear with clear TMs with normal TM mobility and clear middle ear space.  No signs of external otitis.  Wax appears normal but abundant. Nasal: External nose without lesions.. Clear nasal passages Oral: Lips and gums without lesions. Tongue and palate mucosa without lesions. Posterior oropharynx clear. Neck: No palpable adenopathy or masses Respiratory: Breathing comfortably  Skin: No facial/neck  lesions or rash noted.  Cerumen impaction removal  Date/Time: 06/05/2020 3:35 PM Performed by: Rozetta Nunnery, MD Authorized by: Rozetta Nunnery, MD   Consent:    Consent obtained:  Verbal   Consent given by:  Patient   Risks discussed:  Pain and bleeding Procedure details:    Location:  L ear and R ear   Procedure type: suction   Post-procedure details:    Inspection:  TM intact and canal normal   Hearing quality:  Improved   Patient tolerance of procedure:  Tolerated well, no immediate complications Comments:     Ear canals were cleaned bilaterally with suction right side was worse than left.  Both TMs are clear otherwise.    Assessment: Excessive wax buildup and production.  No ear canal or TM abnormality noted otherwise.  No signs of infection or fungus.  Plan: I discussed with Jan concerning findings.  She has a tendency to produce a lot of wax but no signs of infection or abnormality noted otherwise.  Ear canals were cleaned in the office today.  I discussed with her concerning irrigation of her ears as she is doing by no more than once a month with hydroperoxide or Debrox. She will follow-up as needed.   Radene Journey, MD   CC:

## 2020-06-06 ENCOUNTER — Encounter: Payer: 59 | Admitting: Rehabilitative and Restorative Service Providers"

## 2020-06-08 ENCOUNTER — Ambulatory Visit: Payer: 59 | Admitting: Rehabilitative and Restorative Service Providers"

## 2020-06-08 ENCOUNTER — Other Ambulatory Visit: Payer: Self-pay

## 2020-06-08 ENCOUNTER — Encounter: Payer: Self-pay | Admitting: Rehabilitative and Restorative Service Providers"

## 2020-06-08 DIAGNOSIS — M25521 Pain in right elbow: Secondary | ICD-10-CM | POA: Diagnosis not present

## 2020-06-08 DIAGNOSIS — M6281 Muscle weakness (generalized): Secondary | ICD-10-CM | POA: Diagnosis not present

## 2020-06-08 NOTE — Therapy (Addendum)
[facility] [address] [place], [place], [address] Phone: [phone]   Fax:  [phone]  Physical Therapy Treatment/Discharge  Patient Details  Name: Donna Robbins MRN: [id number] Date of Birth: December 06, 1970 Referring Provider (PT): Gregor Hams MD   Encounter Date: 06/08/2020   PT End of Session - 06/08/20 1707    Visit Number 6    Number of Visits 16    Date for PT Re-Evaluation 07/14/20    Authorization Type NA    Progress Note Due on Visit 10    PT Start Time 1016    PT Stop Time 1100    PT Time Calculation (min) 44 min    Activity Tolerance Patient tolerated treatment well    Behavior During Therapy [facility] for tasks assessed/performed           Past Medical History:  Diagnosis Date  . Allergy   . Anxiety   . Depression   . GERD (gastroesophageal reflux disease)   . HEARING LOSS   . Hx of adenomatous polyp of colon 07/18/2016  . Irritable bowel syndrome   . OBESITY     Past Surgical History:  Procedure Laterality Date  . CESAREAN SECTION     x's 2  . CHOLECYSTECTOMY  2009  . ENT surgery  2004    There were no vitals filed for this visit.   Subjective Assessment - 06/08/20 1702    Subjective Jen's neck and shoulder have not had >2/10 pain in over a week.  Tennis elbow is making slower gains although grip strength progress is noted.    Limitations Sitting;Reading;Lifting    Patient Stated Goals Have less aggravation with R UE use and less upper back and neck pain with work and ADLs.    Currently in Pain? Yes    Pain Score 3     Pain Location Elbow    Pain Orientation Right;Lateral    Pain Descriptors / Indicators Aching;Sore;Tender    Pain Type Chronic pain    Pain Onset More than a month ago    Pain Frequency Intermittent    Aggravating Factors  Overuse    Pain Relieving Factors Rest and exercise    Effect of Pain on Daily Activities Endurance with R grip    Multiple Pain Sites No    Pain Onset More than a  month ago    Pain Onset More than a month ago              [facility] PT Assessment - 06/08/20 0001      AROM   Right Shoulder Flexion 170 Degrees    Right Shoulder Internal Rotation 60 Degrees    Right Shoulder External Rotation 95 Degrees    Right Shoulder Horizontal  ADduction 40 Degrees    Cervical Extension 70    Cervical - Right Rotation 65    Cervical - Left Rotation 65      Strength   Right/Left Elbow --   67.5 pounds   Cervical Extension 5/5                         [facility] Adult PT Treatment/Exercise - 06/08/20 0001      Posture/Postural Control   Posture/Postural Control Postural limitations    Postural Limitations Forward head;Rounded Shoulders;Decreased lumbar lordosis      Exercises   Exercises Neck;Shoulder;Elbow      Elbow Exercises   Other elbow exercises Drop eccentrics (palm down, forearm resting  on armrest of chair, slowly control eccentric descent and use R hand to actively bring back to starting position) 20X with 3# 2 sets    Other elbow exercises Wrist flexors stretching (include thumb) 5X 10 seconds      Neck Exercises: Machines for Strengthening   UBE (Upper Arm Bike) 5 minutes pull only :30 Pull/:30 Rest Level 5      Neck Exercises: Seated   Cervical Isometrics Extension;5 secs;10 reps;Other (comment)   Perfect posture     Shoulder Exercises: Standing   External Rotation Strengthening;Right;10 reps;Theraband;Other (comment)   slow eccentrics with wrist in neutral 2 sets   Theraband Level (Shoulder External Rotation) Level 3 (Green)    Row Strengthening;Both;20 reps;Theraband;Other (comment)    Theraband Level (Shoulder Row) Level 4 (Blue)    Retraction Strengthening;Both;10 reps;Other (comment)    Retraction Limitations 5 seconds shoulder blade pinches    Other Standing Exercises Posterior capsule stretch 5X 10 seconds                  PT Education - 06/08/20 1704    Education Details Reviewed and updated HEP.  Reviewed  reassessment findings.    Person(s) Educated Patient    Methods Explanation;Demonstration;Verbal cues;Handout    Comprehension Verbal cues required;Returned demonstration;Need further instruction;Verbalized understanding            PT Short Term Goals - 06/08/20 1705      PT SHORT TERM GOAL #1   Title Donna Robbins will be independent with her starter HEP.    Time 4    Period Weeks    Status Achieved    Target Date 06/16/20      PT SHORT TERM GOAL #2   Title Donna Robbins will have improved cervical, shoulder and grip strength as compared to evaluation.    Baseline See objective.    Time 4    Period Weeks    Status Achieved    Target Date 06/16/20             PT Long Term Goals - 06/08/20 1705      PT LONG TERM GOAL #1   Title Improve FOTO score to 77.    Baseline 55 at evaluation, 70 at 06/08/20 RA    Time 8    Period Weeks    Status On-going      PT LONG TERM GOAL #2   Title Donna Robbins will report neck, R shoulder and R elbow pain at consistently 0-2/10.    Baseline Neck and shoulder met, elbow can be 3-4/10    Time 8    Period Weeks    Status Partially Met      PT LONG TERM GOAL #3   Title Improve R grip strength to at least 100% of the uninvolved L.    Baseline See objective    Time 8    Period Weeks    Status On-going      PT LONG TERM GOAL #4   Title Donna Robbins will be independent with her long-term HEP at DC.    Time 8    Period Weeks    Status On-going                 Plan - 06/08/20 1708    Clinical Impression Statement Donna Robbins has met cervical and shoulder goals.  R elbow is progressing but more slowly.  Her HEP was adjusted to address this and her prognosis is still good with continued work.  As Donna Robbins is largely independent  with her HEP, she will check back in 3-4 weeks for grip strength assessment and reassessment.    Personal Factors and Comorbidities Comorbidity 1    Comorbidities 3 issues at one    Examination-Activity Limitations Carry;Reach Overhead     Examination-Participation Restrictions Occupation;Community Activity    Stability/Clinical Decision Making Stable/Uncomplicated    Rehab Potential Good    PT Frequency 2x / week    PT Duration 8 weeks    PT Treatment/Interventions ADLs/Self Care Home Management;Moist Heat;Cryotherapy;Therapeutic activities;Therapeutic exercise;Neuromuscular re-education;Patient/family education;Manual techniques;Dry needling    PT Next Visit Plan Perform myofascial release techniques (Pt. preference of DN, vs compression/percussive device),  Scapular/cervicothoracic muscle activation/strengthening/endurance c continuation of wrist eccentric loading, triceps loading.    PT Home Exercise Plan Access Code: [email or web address]    Consulted and Agree with Plan of Care Patient           Patient will benefit from skilled therapeutic intervention in order to improve the following deficits and impairments:  Decreased activity tolerance,Decreased endurance,Decreased strength,Hypomobility,Increased edema,Impaired UE functional use,Postural dysfunction,Improper body mechanics,Pain,Obesity  Visit Diagnosis: Pain in right elbow  Muscle weakness (generalized)     Problem List Patient Active Problem List   Diagnosis Date Noted  . Chronic pain of left knee 04/15/2017  . Hx of adenomatous polyp of colon 07/18/2016  . Ventral hernia 11/15/2015  . Routine general medical examination at a health care facility 11/24/2014  . Anxiety   . HEARING LOSS 03/14/2010  . Obesity 03/06/2010  . IRRITABLE BOWEL SYNDROME 03/06/2010    Farley Ly PT, MPT 06/08/2020, 5:10 PM   PHYSICAL THERAPY DISCHARGE SUMMARY  Visits from Start of Care:6  Current functional level related to goals / functional outcomes: See note   Remaining deficits: See note   Education / Equipment: HEP  Plan: Patient agrees to discharge.  Patient goals were not met. Patient is being discharged due to not returning since the last visit.  ?????    Scot Jun, PT, DPT, OCS, ATC 07/21/20  9:08 AM      [facility] [address] [place], [place], [address] Phone: [phone]   Fax:  [phone]  Name: Donna Robbins MRN: [id number] Date of Birth: 1971/03/14

## 2020-06-08 NOTE — Patient Instructions (Signed)
Access Code: [email or web address] URL: [email or web address] Date: 06/08/2020 Prepared by: Vista Mink  Exercises Standing Scapular Retraction - 5 x daily - 7 x weekly - 1 sets - 5 reps - 5 second hold Wrist Flexion Extension AROM with Fingers Curled and Palm Down - 2 x daily - 7 x weekly - 2 sets - 20-30 reps Standing Shoulder Row with Anchored Resistance - 1 x daily - 7 x weekly - 20 reps - 1 sets Shoulder External Rotation with Anchored Resistance with Towel Under Elbow - 1 x daily - 7 x weekly - 2 sets - 10 reps - 3 second hold Standing Isometric Cervical Extension with Manual Resistance - 2-3 x daily - 7 x weekly - 1 sets - 5 reps - 5 seconds hold

## 2020-06-12 ENCOUNTER — Other Ambulatory Visit (HOSPITAL_BASED_OUTPATIENT_CLINIC_OR_DEPARTMENT_OTHER): Payer: Self-pay

## 2020-06-13 ENCOUNTER — Encounter: Payer: 59 | Admitting: Rehabilitative and Restorative Service Providers"

## 2020-06-15 ENCOUNTER — Encounter: Payer: 59 | Admitting: Rehabilitative and Restorative Service Providers"

## 2020-06-19 NOTE — Progress Notes (Signed)
° °  I, Wendy Poet, LAT, ATC, am serving as scribe for Dr. Lynne Leader.  Donna Robbins is a 50 y.o. female who presents to [place] at [facility] today for f/u of neck, R shoulder and R elbow pain.  She was last seen by Dr. Georgina Snell on 05/08/20 and was referred to PT of which she's completed 6 sessions.  She was also advised to use Voltaren gel and a TENs unit.  Since her last visit, pt reports that her neck and R shoulder pain are much improved but notes that her R elbow is more slowly improving.  She's con't PT w/ a reduced frequency.  She states that she is con't to have a difficult time w/ grasping and having to focus on con't to hold her grasp.  She reports having an issue w/ dropping things.  Diagnostic testing: C-spine and R elbow XR- 05/08/20   Pertinent review of systems: No fevers or chills  Relevant historical information: IBS   Exam:  BP 124/84 (BP Location: Right Arm, Patient Position: Sitting, Cuff Size: Normal)    Pulse 74    Ht 5\' 3"  (1.6 m)    Wt 204 lb (92.5 kg)    SpO2 97%    BMI 36.14 kg/m  General: Well Developed, well nourished, and in no acute distress.   MSK: Right elbow normal-appearing normal motion. Palpation palpation lateral epicondyle. Some pain with resisted wrist extension. Elbow strength is intact. Right hand and wrist normal-appearing normal sensation.  Intact strength and motion.     Assessment and Plan: 50 y.o. female with residual lateral epicondylitis and some possible subtle hand weakness.  This is all I think related to lateral epicondylitis.  I think she has learned effectively to not grip things forcefully because it hurts.  Plan to continue to work on lateral epicondylitis with home exercise program.  Recommend Thera-Band flex bar.  If needed could refer directly to hand physical therapy for more specialized care however I do not think this is necessary.  Additionally will proceed with nitroglycerin patch protocol.  Recheck  in 6 weeks if needed.  If the dropping objects continues could consider nerve conduction study however I would like to avoid that if possible.   PDMP not reviewed this encounter. No orders of the defined types were placed in this encounter.  Meds ordered this encounter  Medications   nitroGLYCERIN (NITRODUR - DOSED IN MG/24 HR) 0.2 mg/hr patch    Sig: Apply 1/4 patch daily to tendon for tendonitis.    Dispense:  30 patch    Refill:  1     Discussed warning signs or symptoms. Please see discharge instructions. Patient expresses understanding.   The above documentation has been reviewed and is accurate and complete Lynne Leader, M.D.

## 2020-06-20 ENCOUNTER — Other Ambulatory Visit: Payer: Self-pay | Admitting: Family Medicine

## 2020-06-20 ENCOUNTER — Encounter: Payer: Self-pay | Admitting: Family Medicine

## 2020-06-20 ENCOUNTER — Ambulatory Visit: Payer: 59 | Admitting: Family Medicine

## 2020-06-20 ENCOUNTER — Other Ambulatory Visit: Payer: Self-pay

## 2020-06-20 VITALS — BP 124/84 | HR 74 | Ht 63.0 in | Wt 204.0 lb

## 2020-06-20 DIAGNOSIS — M7711 Lateral epicondylitis, right elbow: Secondary | ICD-10-CM | POA: Diagnosis not present

## 2020-06-20 DIAGNOSIS — R29898 Other symptoms and signs involving the musculoskeletal system: Secondary | ICD-10-CM | POA: Diagnosis not present

## 2020-06-20 MED ORDER — NITROGLYCERIN 0.2 MG/HR TD PT24: MEDICATED_PATCH | TRANSDERMAL | 1 refills | Status: DC

## 2020-06-20 MED FILL — NITROGLYCERIN 0.2 MG/HR PTC: 0.2 | 28 days supply | Qty: 7 | Fill #0

## 2020-06-20 NOTE — Patient Instructions (Signed)
Thank you for coming in today.  Continue home exercises.  Use theraband flexbar yellow or red.   Recheck in 6 weeks if not better.   Let me know if you have a problem.   We can do more if needed.

## 2020-06-28 ENCOUNTER — Encounter: Payer: 59 | Admitting: Rehabilitative and Restorative Service Providers"

## 2020-06-29 ENCOUNTER — Encounter: Payer: 59 | Admitting: Rehabilitative and Restorative Service Providers"

## 2020-07-20 ENCOUNTER — Encounter: Payer: 59 | Admitting: Rehabilitative and Restorative Service Providers"

## 2020-07-31 NOTE — Progress Notes (Signed)
   I, Peterson Lombard, LAT, ATC acting as a scribe for Lynne Leader, MD.  Donna Robbins is a 50 y.o. female who presents to [place] at [facility] today for f/u R elbow pain thought to be due to lateral epicondylitis. Pt was last seen by Dr. Georgina Snell on 06/20/20 and advised to cont HEP, including thera-band flex bar, and was prescribed nitro patches. Today, pt reports elbow pain is much better. Pt works on HEP whenever pain flares up. Pt hasn't needed to use nitro patches.  Dx imaging: 05/08/20 C-spine & R elbow XR  Pertinent review of systems: No fevers or chills  Relevant historical information: IBS   Exam:  BP (!) 130/94 (BP Location: Right Arm, Patient Position: Sitting, Cuff Size: Normal)   Pulse 82   Ht 5\' 3"  (1.6 m)   Wt 206 lb 9.6 oz (93.7 kg)   SpO2 99%   BMI 36.60 kg/m  General: Well Developed, well nourished, and in no acute distress.   MSK: Right elbow normal-appearing minimally tender lateral epicondyle normal motion normal elbow strength. Grip strength and wrist and finger extension strength are intact.    Lab and Radiology Results  EXAM: RIGHT ELBOW - COMPLETE 3+ VIEW  COMPARISON:  None.  FINDINGS: There is no evidence of fracture, dislocation, or joint effusion. There is no evidence of arthropathy or other focal bone abnormality. Soft tissues are unremarkable.  IMPRESSION: Negative.   Electronically Signed   By: Nolon Nations M.D.   On: 05/08/2020 09:00  I, Lynne Leader, personally (independently) visualized and performed the interpretation of the images attached in this note.    Assessment and Plan: 50 y.o. female with right elbow pain thought to be lateral epicondylitis.  Significant improvement with home exercise program.  Watchful waiting continue home exercise program and recheck back as needed.  Discussed ongoing home exercises and precautions.    Discussed warning signs or symptoms. Please see discharge  instructions. Patient expresses understanding.   The above documentation has been reviewed and is accurate and complete Lynne Leader, M.D.

## 2020-08-01 ENCOUNTER — Ambulatory Visit: Payer: 59 | Admitting: Family Medicine

## 2020-08-01 ENCOUNTER — Other Ambulatory Visit: Payer: Self-pay

## 2020-08-01 VITALS — BP 130/94 | HR 82 | Ht 63.0 in | Wt 206.6 lb

## 2020-08-01 DIAGNOSIS — M7711 Lateral epicondylitis, right elbow: Secondary | ICD-10-CM | POA: Diagnosis not present

## 2020-08-01 NOTE — Patient Instructions (Signed)
Thank you for coming in today.  Continue the exercises.   Consider a thumb loop wrist strap.   Recheck with me as needed.   Happy to see you any time.

## 2020-08-11 ENCOUNTER — Other Ambulatory Visit (HOSPITAL_COMMUNITY): Payer: Self-pay

## 2020-08-11 MED FILL — Clindamycin Phosphate Gel 1%: CUTANEOUS | 30 days supply | Qty: 60 | Fill #0 | Status: CN

## 2020-08-13 MED FILL — Clonazepam Tab 0.5 MG: ORAL | 30 days supply | Qty: 30 | Fill #0 | Status: AC

## 2020-08-14 ENCOUNTER — Other Ambulatory Visit (HOSPITAL_COMMUNITY): Payer: Self-pay

## 2020-08-16 ENCOUNTER — Other Ambulatory Visit (HOSPITAL_COMMUNITY): Payer: Self-pay

## 2020-08-16 MED ORDER — CLINDAMYCIN PHOSPHATE 1 % EX SOLN
CUTANEOUS | 6 refills | Status: DC
  Filled 2020-08-16 – 2021-01-03 (×2): qty 60, 30d supply, fill #0

## 2020-08-17 ENCOUNTER — Other Ambulatory Visit (HOSPITAL_COMMUNITY): Payer: Self-pay

## 2020-08-28 ENCOUNTER — Other Ambulatory Visit (HOSPITAL_COMMUNITY): Payer: Self-pay

## 2020-10-24 DIAGNOSIS — Z1231 Encounter for screening mammogram for malignant neoplasm of breast: Secondary | ICD-10-CM | POA: Diagnosis not present

## 2020-11-06 DIAGNOSIS — R922 Inconclusive mammogram: Secondary | ICD-10-CM | POA: Diagnosis not present

## 2020-11-06 DIAGNOSIS — N6489 Other specified disorders of breast: Secondary | ICD-10-CM | POA: Diagnosis not present

## 2020-11-06 DIAGNOSIS — R928 Other abnormal and inconclusive findings on diagnostic imaging of breast: Secondary | ICD-10-CM | POA: Diagnosis not present

## 2020-11-06 DIAGNOSIS — R921 Mammographic calcification found on diagnostic imaging of breast: Secondary | ICD-10-CM | POA: Diagnosis not present

## 2020-11-08 ENCOUNTER — Other Ambulatory Visit: Payer: Self-pay

## 2020-11-08 DIAGNOSIS — C50811 Malignant neoplasm of overlapping sites of right female breast: Secondary | ICD-10-CM | POA: Diagnosis not present

## 2020-11-09 ENCOUNTER — Encounter: Payer: Self-pay | Admitting: Internal Medicine

## 2020-11-10 ENCOUNTER — Telehealth: Payer: Self-pay | Admitting: Oncology

## 2020-11-10 ENCOUNTER — Other Ambulatory Visit (HOSPITAL_COMMUNITY): Payer: Self-pay

## 2020-11-10 MED FILL — Clonazepam Tab 0.5 MG: ORAL | 30 days supply | Qty: 30 | Fill #1 | Status: CN

## 2020-11-10 NOTE — Telephone Encounter (Signed)
Spoke to patient to confirm afternoon clinic appointment for 8/24, [facility] will send packet

## 2020-11-13 ENCOUNTER — Encounter: Payer: Self-pay | Admitting: *Deleted

## 2020-11-13 DIAGNOSIS — C50411 Malignant neoplasm of upper-outer quadrant of right female breast: Secondary | ICD-10-CM | POA: Insufficient documentation

## 2020-11-13 DIAGNOSIS — C50919 Malignant neoplasm of unspecified site of unspecified female breast: Secondary | ICD-10-CM | POA: Insufficient documentation

## 2020-11-13 DIAGNOSIS — Z170 Estrogen receptor positive status [ER+]: Secondary | ICD-10-CM | POA: Insufficient documentation

## 2020-11-15 ENCOUNTER — Ambulatory Visit: Payer: 59 | Attending: Orthopaedic Surgery | Admitting: Physical Therapy

## 2020-11-15 ENCOUNTER — Other Ambulatory Visit: Payer: Self-pay | Admitting: Surgery

## 2020-11-15 ENCOUNTER — Encounter: Payer: Self-pay | Admitting: Physical Therapy

## 2020-11-15 ENCOUNTER — Other Ambulatory Visit: Payer: Self-pay

## 2020-11-15 ENCOUNTER — Encounter: Payer: Self-pay | Admitting: Oncology

## 2020-11-15 ENCOUNTER — Inpatient Hospital Stay (HOSPITAL_BASED_OUTPATIENT_CLINIC_OR_DEPARTMENT_OTHER): Payer: 59 | Admitting: Oncology

## 2020-11-15 ENCOUNTER — Ambulatory Visit
Admission: RE | Admit: 2020-11-15 | Discharge: 2020-11-15 | Disposition: A | Discharge status: Home / Self Care | Payer: 59 | Source: Ambulatory Visit | Attending: Radiation Oncology | Admitting: Radiation Oncology

## 2020-11-15 ENCOUNTER — Inpatient Hospital Stay: Payer: 59

## 2020-11-15 VITALS — BP 124/84 | HR 77 | Temp 97.9°F | Resp 18 | Ht 63.0 in | Wt 199.0 lb

## 2020-11-15 DIAGNOSIS — C50411 Malignant neoplasm of upper-outer quadrant of right female breast: Secondary | ICD-10-CM

## 2020-11-15 DIAGNOSIS — R293 Abnormal posture: Secondary | ICD-10-CM | POA: Insufficient documentation

## 2020-11-15 DIAGNOSIS — Z170 Estrogen receptor positive status [ER+]: Secondary | ICD-10-CM

## 2020-11-15 DIAGNOSIS — Z853 Personal history of malignant neoplasm of breast: Secondary | ICD-10-CM

## 2020-11-15 DIAGNOSIS — K219 Gastro-esophageal reflux disease without esophagitis: Secondary | ICD-10-CM | POA: Insufficient documentation

## 2020-11-15 DIAGNOSIS — Z79899 Other long term (current) drug therapy: Secondary | ICD-10-CM | POA: Insufficient documentation

## 2020-11-15 DIAGNOSIS — D0511 Intraductal carcinoma in situ of right breast: Secondary | ICD-10-CM | POA: Diagnosis not present

## 2020-11-15 DIAGNOSIS — F32A Depression, unspecified: Secondary | ICD-10-CM | POA: Insufficient documentation

## 2020-11-15 DIAGNOSIS — F419 Anxiety disorder, unspecified: Secondary | ICD-10-CM | POA: Insufficient documentation

## 2020-11-15 LAB — CBC WITH DIFFERENTIAL (CANCER CENTER ONLY)
Abs Immature Granulocytes: 0.03 10*3/uL (ref 0.00–0.07)
Basophils Absolute: 0 10*3/uL (ref 0.0–0.1)
Basophils Relative: 1 %
Eosinophils Absolute: 0.1 10*3/uL (ref 0.0–0.5)
Eosinophils Relative: 2 %
HCT: 42.2 % (ref 36.0–46.0)
Hemoglobin: 13.8 g/dL (ref 12.0–15.0)
Immature Granulocytes: 1 %
Lymphocytes Relative: 28 %
Lymphs Abs: 1.9 10*3/uL (ref 0.7–4.0)
MCH: 31.2 pg (ref 26.0–34.0)
MCHC: 32.7 g/dL (ref 30.0–36.0)
MCV: 95.5 fL (ref 80.0–100.0)
Monocytes Absolute: 0.5 10*3/uL (ref 0.1–1.0)
Monocytes Relative: 8 %
Neutro Abs: 4 10*3/uL (ref 1.7–7.7)
Neutrophils Relative %: 60 %
Platelet Count: 338 10*3/uL (ref 150–400)
RBC: 4.42 MIL/uL (ref 3.87–5.11)
RDW: 12.4 % (ref 11.5–15.5)
WBC Count: 6.6 10*3/uL (ref 4.0–10.5)
nRBC: 0 % (ref 0.0–0.2)

## 2020-11-15 LAB — CMP (CANCER CENTER ONLY)
ALT: 21 U/L (ref 0–44)
AST: 22 U/L (ref 15–41)
Albumin: 4.3 g/dL (ref 3.5–5.0)
Alkaline Phosphatase: 62 U/L (ref 38–126)
Anion gap: 9 (ref 5–15)
BUN: 10 mg/dL (ref 6–20)
CO2: 28 mmol/L (ref 22–32)
Calcium: 9.7 mg/dL (ref 8.9–10.3)
Chloride: 103 mmol/L (ref 98–111)
Creatinine: 0.76 mg/dL (ref 0.44–1.00)
GFR, Estimated: >60 mL/min (ref >60)
Glucose, Bld: 78 mg/dL (ref 70–99)
Potassium: 4.1 mmol/L (ref 3.5–5.1)
Sodium: 140 mmol/L (ref 135–145)
Total Bilirubin: 0.4 mg/dL (ref 0.3–1.2)
Total Protein: 8 g/dL (ref 6.5–8.1)

## 2020-11-15 LAB — GENETIC SCREENING ORDER

## 2020-11-15 NOTE — Therapy (Signed)
[place], [place], [address] Phone: [phone]   Fax:  [phone]  Physical Therapy Evaluation  Patient Details  Name: Donna Robbins MRN: [id number] Date of Birth: [birth year] Referring Provider (PT): Dr. Coralie Keens   Encounter Date: 11/15/2020   PT End of Session - 11/15/20 1517     Visit Number 1    Number of Visits 2    Date for PT Re-Evaluation 01/10/21    PT Start Time 4481    PT Stop Time 8563    PT Time Calculation (min) 28 min    Activity Tolerance Patient tolerated treatment well    Behavior During Therapy [facility] for tasks assessed/performed             Past Medical History:  Diagnosis Date   Allergy    Anxiety    Breast cancer ([place])    Depression    GERD (gastroesophageal reflux disease)    HEARING LOSS    Hx of adenomatous polyp of colon 07/18/2016   Irritable bowel syndrome    OBESITY     Past Surgical History:  Procedure Laterality Date   CESAREAN SECTION     x's 2   CHOLECYSTECTOMY  2009   ENT surgery  2004    There were no vitals filed for this visit.    Subjective Assessment - 11/15/20 1507     Subjective Patient reports she is here today to be seen by her medical team for her newly diagnosed right breast cancer.    Patient is accompained by: Family member    Pertinent History Patient was diagnosed on 10/24/2020 with right grade I invasive ductal carcinoma breast cancer. It measures 8 mm and is located in their upper outer quadrant. It is ER/PR positive and HER2 negative with a Ki67 of 1%.    Patient Stated Goals Reduce lymphedema risk and learn post op shoulder ROM HEP    Currently in Pain? No/denies                [facility] PT Assessment - 11/15/20 0001       Assessment   Medical Diagnosis Right breast cancer    Referring Provider (PT) Dr. Coralie Keens    Onset Date/Surgical Date 10/24/20    Hand Dominance Right    Prior Therapy none       Precautions   Precautions Other (comment)    Precaution Comments active cancer      Restrictions   Weight Bearing Restrictions No      Balance Screen   Has the patient fallen in the past 6 months No    Has the patient had a decrease in activity level because of a fear of falling?  No    Is the patient reluctant to leave their home because of a fear of falling?  No      Home Environment   Living Environment Private residence    Living Arrangements Spouse/significant other;Children   Husband, 39 and 6 y.o. sons   Available Help at Discharge Family      Prior Function   Level of Independence Independent    Vocation Full time employment    Games developer for [employer]    Leisure She walks 30 min.day      Cognition   Overall Cognitive Status Within Functional Limits for tasks assessed      Posture/Postural Control   Posture/Postural Control Postural limitations    Postural  Limitations Rounded Shoulders;Forward head      ROM / Strength   AROM / PROM / Strength AROM;Strength      AROM   Overall AROM Comments Cervical AROM is WNL    AROM Assessment Site Shoulder    Right/Left Shoulder Right;Left    Right Shoulder Extension 42 Degrees    Right Shoulder Flexion 163 Degrees    Right Shoulder ABduction 167 Degrees    Right Shoulder Internal Rotation 87 Degrees    Right Shoulder External Rotation 68 Degrees    Left Shoulder Extension 46 Degrees    Left Shoulder Flexion 162 Degrees    Left Shoulder ABduction 168 Degrees    Left Shoulder Internal Rotation 61 Degrees    Left Shoulder External Rotation 87 Degrees      Strength   Overall Strength Within functional limits for tasks performed               LYMPHEDEMA/ONCOLOGY QUESTIONNAIRE - 11/15/20 0001       Type   Cancer Type Right breast cancer      Lymphedema Assessments   Lymphedema Assessments Upper extremities      Right Upper Extremity Lymphedema   10  cm Proximal to Olecranon Process 33.9 cm    Olecranon Process 25.8 cm    10 cm Proximal to Ulnar Styloid Process 25 cm    Just Proximal to Ulnar Styloid Process 16.8 cm    Across Hand at [employer] 18.9 cm    At [place] of 2nd Digit 6.2 cm      Left Upper Extremity Lymphedema   10 cm Proximal to Olecranon Process 34 cm    Olecranon Process 25.2 cm    10 cm Proximal to Ulnar Styloid Process 23.8 cm    Just Proximal to Ulnar Styloid Process 16 cm    Across Hand at [employer] 18.6 cm    At [place] of 2nd Digit 6 cm             L-DEX FLOWSHEETS - 11/15/20 1500       L-DEX LYMPHEDEMA SCREENING   Measurement Type Unilateral    L-DEX MEASUREMENT EXTREMITY Upper Extremity    POSITION  Standing    DOMINANT SIDE Right    At Risk Side Right    BASELINE SCORE (UNILATERAL) 0.5            The patient was assessed using the L-Dex machine today to produce a lymphedema index baseline score. The patient will be reassessed on a regular basis (typically every 3 months) to obtain new L-Dex scores. If the score is > 6.5 points away from his/her baseline score indicating onset of subclinical lymphedema, it will be recommended to wear a compression garment for 4 weeks, 12 hours per day and then be reassessed. If the score continues to be > 6.5 points from baseline at reassessment, we will initiate lymphedema treatment. Assessing in this manner has a 95% rate of preventing clinically significant lymphedema.       Katina Dung - 11/15/20 0001     Open a tight or new jar Mild difficulty    Do heavy household chores (wash walls, wash floors) Mild difficulty    Carry a shopping bag or briefcase No difficulty    Wash your back No difficulty    Use a knife to cut food No difficulty    Recreational activities in which you take some force or impact through your arm, shoulder, or hand (golf, hammering,  tennis) Mild difficulty    During the past week, to what extent has your arm, shoulder or hand  problem interfered with your normal social activities with family, friends, neighbors, or groups? Not at all    During the past week, to what extent has your arm, shoulder or hand problem limited your work or other regular daily activities Not at all    Arm, shoulder, or hand pain. Mild    Tingling (pins and needles) in your arm, shoulder, or hand Mild    Difficulty Sleeping Moderate difficulty    DASH Score 15.91 %              Objective measurements completed on examination: See above findings.       Patient was instructed today in a home exercise program today for post op shoulder range of motion. These included active assist shoulder flexion in sitting, scapular retraction, wall walking with shoulder abduction, and hands behind head external rotation.  She was encouraged to do these twice a day, holding 3 seconds and repeating 5 times when permitted by her physician.          PT Education - 11/15/20 1516     Education Details Lymphedema risk reduction and post op shoulder HEP    Person(s) Educated Patient;Spouse    Methods Explanation;Demonstration;Handout    Comprehension Verbalized understanding;Returned demonstration                 PT Long Term Goals - 11/15/20 1524       PT LONG TERM GOAL #1   Title Patient will demonstrate she has regained full shoulder ROM and function post operatively compared ot baselines.    Time 8    Period Weeks    Status New    Target Date 01/10/21             Breast Clinic Goals - 11/15/20 1524       Patient will be able to verbalize understanding of pertinent lymphedema risk reduction practices relevant to her diagnosis specifically related to skin care.   Time 1    Period Days    Status Achieved      Patient will be able to return demonstrate and/or verbalize understanding of the post-op home exercise program related to regaining shoulder range of motion.   Time 1    Period Days    Status Achieved       Patient will be able to verbalize understanding of the importance of attending the postoperative After Breast Cancer Class for further lymphedema risk reduction education and therapeutic exercise.   Time 1    Period Days    Status Achieved                   Plan - 11/15/20 1517     Clinical Impression Statement Patient was diagnosed on 10/24/2020 with right grade I invasive ductal carcinoma breast cancer. It measures 8 mm and is located in their upper outer quadrant. It is ER/PR positive and HER2 negative with a Ki67 of 1%. Her multidisciplinary medical team met prior to her assessments to determine a recommended treatment plan. She is planning to have a right lumpectomy and sentinel node biopsy followed by Oncotype testing, radiation, and anti-estrogen therapy. She will benefit from a post op PT reassessment to determine needs and from L-Dex screens every 3 weeks    Stability/Clinical Decision Making Stable/Uncomplicated    Clinical Decision Making Low    Rehab Potential Excellent  PT Frequency --   Eval and 1 f/u visit   PT Treatment/Interventions ADLs/Self Care Home Management;Therapeutic exercise;Patient/family education    PT Next Visit Plan Will reassess 3-4 weeks post op    PT Home Exercise Plan Post op shoulder HEP    Consulted and Agree with Plan of Care Patient;Family member/caregiver    Family Member Consulted Husband             Patient will benefit from skilled therapeutic intervention in order to improve the following deficits and impairments:  Postural dysfunction, Decreased range of motion, Decreased knowledge of precautions, Impaired UE functional use, Pain  Visit Diagnosis: Malignant neoplasm of upper-outer quadrant of right breast in female, estrogen receptor positive ([place]) - Plan: PT plan of care cert/re-cert  Abnormal posture - Plan: PT plan of care cert/re-cert  Patient will follow up at outpatient cancer rehab 3-4 weeks following surgery.  If the  patient requires physical therapy at that time, a specific plan will be dictated and sent to the referring physician for approval. The patient was educated today on appropriate basic range of motion exercises to begin post operatively and the importance of attending the After Breast Cancer class following surgery.  Patient was educated today on lymphedema risk reduction practices as it pertains to recommendations that will benefit the patient immediately following surgery.  She verbalized good understanding.      Problem List Patient Active Problem List   Diagnosis Date Noted   Malignant neoplasm of upper-outer quadrant of right breast in female, estrogen receptor positive ([place]) 11/13/2020   Chronic pain of left knee 04/15/2017   Hx of adenomatous polyp of colon 07/18/2016   Ventral hernia 11/15/2015   Routine general medical examination at a health care facility 11/24/2014   Anxiety    HEARING LOSS 03/14/2010   Obesity 03/06/2010   IRRITABLE BOWEL SYNDROME 03/06/2010   Annia Friendly, PT 11/15/20 3:27 PM   [place], [place], [address] Phone: [phone]   Fax:  [phone]  Name: Donna Robbins MRN: [id number] Date of Birth: 1971-03-05

## 2020-11-15 NOTE — Patient Instructions (Signed)

## 2020-11-15 NOTE — Progress Notes (Addendum)
[place]  [phone] [phone]     ID: Donna Robbins DOB: 04/22/1970  MR#: [id number]  JTT#:[id number]  Patient Care Team: Hoyt Koch, MD as PCP - General (Internal Medicine) Servando Salina, MD as Consulting Physician (Obstetrics and Gynecology) Thurman Coyer, DO as Consulting Physician (Family Medicine) Gatha Mayer, MD (Gastroenterology) Coralie Keens, MD as Consulting Physician (General Surgery) Rocsi Hazelbaker, Virgie Dad, MD as Consulting Physician (Oncology) Gery Pray, MD as Consulting Physician (Radiation Oncology) Rockwell Germany, RN as Oncology Nurse Navigator Mauro Kaufmann, RN as Oncology Nurse Navigator Aurea Graff OTHER MD:  CHIEF COMPLAINT: Estrogen receptor positive breast cancer  CURRENT TREATMENT: Awaiting definitive surgery   HISTORY OF CURRENT ILLNESS: Donna Robbins had routine screening mammography on 10/24/2020 showing a possible abnormality in the right breast. She underwent right diagnostic mammography with tomography and right breast ultrasonography at [facility] on 11/06/2020 showing: breast density category B; 0.8 cm irregular mass in right breast at 9 o'clock; no right axillary lymphadenopathy.  Accordingly on 11/08/2020 she proceeded to biopsy of the right breast area in question. The pathology from this procedure ([id number]) showed: invasive ductal carcinoma, grade 1/2; ductal carcinoma in situ. Prognostic indicators significant for: estrogen receptor, 100% positive and progesterone receptor, 100% positive, both with strong staining intensity. Proliferation marker Ki67 at 1%. HER2 negative by immunohistochemistry (0).  Cancer Staging No matching staging information was found for the patient.  The patient's subsequent history is as detailed below.   INTERVAL HISTORY: Donna "Donna Robbins" was evaluated in the multidisciplinary breast cancer clinic on 11/15/2020. Her case was also  presented at the multidisciplinary breast cancer conference on the same day. At that time a preliminary plan was proposed: Breast conserving surgery, adjuvant radiation, Oncotype testing, antiestrogens   REVIEW OF SYSTEMS: There were no specific symptoms leading to the original mammogram, which was routinely scheduled. On the provided questionnaire, Donna Robbins reports fatigue, sinus problems, wearing glasses, skin rash, joint pain, and anxiety. The patient denies unusual headaches, visual changes, nausea, vomiting, stiff neck, dizziness, or gait imbalance. There has been no cough, phlegm production, or pleurisy, no chest pain or pressure, and no change in bowel or bladder habits. The patient denies fever, rash, bleeding, unexplained fatigue or unexplained weight loss. A detailed review of systems was otherwise entirely negative.   COVID 19 VACCINATION STATUS: Pfizer x3, most recently 02/2020   PAST MEDICAL HISTORY: Past Medical History:  Diagnosis Date   Allergy    Anxiety    Depression    GERD (gastroesophageal reflux disease)    HEARING LOSS    Hx of adenomatous polyp of colon 07/18/2016   Irritable bowel syndrome    OBESITY     PAST SURGICAL HISTORY: Past Surgical History:  Procedure Laterality Date   CESAREAN SECTION     x's 2   CHOLECYSTECTOMY  2009   ENT surgery  2004    FAMILY HISTORY: Family History  Problem Relation Age of Onset   Cancer Father        Angio sarcoma   Hyperlipidemia Father    Diabetes Mother    Hyperlipidemia Mother    Arthritis Maternal Grandmother    Lung cancer Maternal Grandmother    Arthritis Other    Lung cancer Maternal Grandfather    Colon cancer Neg Hx    Stomach cancer Neg Hx    Rectal cancer Neg Hx    Esophageal cancer Neg Hx    Liver cancer Neg Hx  Her father died at age 75 of angiosarcoma. This was secondary to Agent Orange exposure and was diagnosed at age 72. Her mother is 46 years old, as of 10/2020. She has a history of melanoma of  the eye, diagnosed at age 73. Donna Robbins has two brothers (and no sisters). In addition to her parents, she reports lung cancer in both maternal grandparents and a maternal aunt (all were smokers).   GYNECOLOGIC HISTORY:  No LMP recorded. Menarche: 50 years old Age at first live birth: 50 years old [place] P 2 LMP 11/09/2020 Contraceptive: used from age 33 to 17 HRT n/a  Hysterectomy? no BSO? 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 Husband Remo Lipps is an English as a second language teacher in estate planning. She lives at home with Remo Lipps and their two children, Diona Fanti, age 79, and 33, age 85. She is not a Designer, fashion/clothing.    ADVANCED DIRECTIVES: in place   HEALTH MAINTENANCE: Social History   Tobacco Use   Smoking status: Never   Smokeless tobacco: Never  Substance Use Topics   Alcohol use: Yes    Comment: Rarely   Drug use: No     Colonoscopy: 06/2016 (Dr. Carlean Purl), recall 2023  PAP: fall 2021  Bone density: never done   Allergies  Allergen Reactions   Iodine     REACTION: Itching   Sulfonamide Derivatives     REACTION: Itching Rash    Current Outpatient Medications  Medication Sig Dispense Refill   cholecalciferol (VITAMIN D) 1000 units tablet Take 5,000 Units by mouth daily.      clindamycin (CLEOCIN T) 1 % external solution Apply 1 mL on the skin twice a day 60 mL 6   clindamycin (CLINDAGEL) 1 % gel APPLY 1 APPLICATION ON THE SKIN TWICE A DAY 60 g 2   clonazePAM (KLONOPIN) 0.5 MG tablet TAKE 1/2 TO 1 TABLET BY MOUTH DAILY AS NEEDED FOR ANXIETY 30 tablet 2   diclofenac sodium (VOLTAREN) 1 % GEL Apply 2 g topically 4 (four) times daily. Rub into affected area of foot 2 to 4 times daily 100 g 2   ferrous sulfate 325 (65 FE) MG EC tablet Take 325 mg by mouth daily.      Multiple Vitamin (MULTIVITAMIN) capsule Take 1 capsule by mouth daily.     nitroGLYCERIN (NITRODUR - DOSED IN MG/24 HR) 0.2 mg/hr patch  APPLY 1/4 PATCH DAILY TO TENDON FOR TENDONITIS. 30 patch 1   vitamin B-12 (CYANOCOBALAMIN) 1000 MCG tablet      No current facility-administered medications for this visit.    OBJECTIVE: White woman who appears stated age  50:   11/15/20 1244  BP: 124/84  Pulse: 77  Resp: 18  Temp: 97.9 F (36.6 C)  SpO2: 98%     Body mass index is 35.25 kg/m.   Wt Readings from Last 3 Encounters:  11/15/20 199 lb (90.3 kg)  08/01/20 206 lb 9.6 oz (93.7 kg)  06/20/20 204 lb (92.5 kg)      ECOG FS:1 - Symptomatic but completely ambulatory  Ocular: Sclerae unicteric, pupils round and equal Ear-nose-throat: Wearing a mask Lymphatic: No cervical or supraclavicular adenopathy Lungs no rales or rhonchi Heart regular rate and rhythm Abd soft, nontender, positive bowel sounds MSK no focal spinal tenderness, no joint edema Neuro: non-focal, well-oriented, appropriate affect Breasts: The right breast is status post recent biopsy.  I do not palpate a mass.  There are no skin  or nipple changes of concern.  The left breast and both axillae are benign.   LAB RESULTS:  CMP     Component Value Date/Time   NA 140 02/07/2020 0916   K 4.0 02/07/2020 0916   CL 104 02/07/2020 0916   CO2 30 02/07/2020 0916   GLUCOSE 85 02/07/2020 0916   BUN 9 02/07/2020 0916   CREATININE 0.61 02/07/2020 0916   CALCIUM 9.2 02/07/2020 0916   PROT 7.5 02/07/2020 0916   ALBUMIN 4.2 02/07/2020 0916   AST 18 02/07/2020 0916   ALT 16 02/07/2020 0916   ALKPHOS 49 02/07/2020 0916   BILITOT 0.4 02/07/2020 0916   GFRNONAA >60 05/23/2010 2050   GFRAA  05/23/2010 2050    >60        The eGFR has been calculated using the MDRD equation. This calculation has not been validated in all clinical situations. eGFR's persistently <60 mL/min signify possible Chronic Kidney Disease.    No results found for: TOTALPROTELP, ALBUMINELP, A1GS, A2GS, BETS, BETA2SER, GAMS, MSPIKE, SPEI  Lab Results  Component Value Date   WBC  6.6 11/15/2020   NEUTROABS 4.0 11/15/2020   HGB 13.8 11/15/2020   HCT 42.2 11/15/2020   MCV 95.5 11/15/2020   PLT 338 11/15/2020    No results found for: LABCA2  No components found for: WERXVQ008  No results for input(s): INR in the last 168 hours.  No results found for: LABCA2  No results found for: QPY195  No results found for: KDT267  No results found for: TIW580  No results found for: CA2729  No components found for: HGQUANT  No results found for: CEA1 / No results found for: CEA1   No results found for: AFPTUMOR  No results found for: CHROMOGRNA  No results found for: KPAFRELGTCHN, LAMBDASER, KAPLAMBRATIO (kappa/lambda light chains)  No results found for: HGBA, HGBA2QUANT, HGBFQUANT, HGBSQUAN (Hemoglobinopathy evaluation)   No results found for: LDH  No results found for: IRON, TIBC, IRONPCTSAT (Iron and TIBC)  Lab Results  Component Value Date   FERRITIN 39.8 08/01/2017    Urinalysis    Component Value Date/Time   COLORURINE YELLOW 08/13/2013 [place] 08/13/2013 1007   LABSPEC <=1.005 (A) 08/13/2013 1007   PHURINE 7.0 08/13/2013 1007   GLUCOSEU NEGATIVE 08/13/2013 1007   HGBUR NEGATIVE 08/13/2013 [place] 08/13/2013 1007   BILIRUBINUR neg 07/13/2011 0939   KETONESUR NEGATIVE 08/13/2013 1007   PROTEINUR neg 07/13/2011 0939   PROTEINUR NEGATIVE 05/23/2010 2233   UROBILINOGEN 0.2 08/13/2013 1007   NITRITE NEGATIVE 08/13/2013 1007   LEUKOCYTESUR NEGATIVE 08/13/2013 1007    STUDIES: No results found.   ELIGIBLE FOR AVAILABLE RESEARCH PROTOCOL: no  ASSESSMENT: 50 y.o. [place] woman status post right breast upper outer quadrant biopsy 11/08/2020 for a clinical T1b N0, stage IA invasive ductal carcinoma, low-to intermediate grade, estrogen and progesterone receptor positive, HER2 not amplified, with an MIB-1 of 1%  (1) definitive surgery pending  (2) Oncotype to be obtained from the definitive surgical  sample.  Chemotherapy not anticipated  (3) adjuvant radiation  (4) antiestrogens to follow the completion of local treatment.  PLAN: I met today with Donna Robbins to review her new diagnosis. Specifically we discussed the biology of her breast cancer, its diagnosis, staging, treatment  options and prognosis. We first reviewed the fact that cancer is not one disease but more than 100 different diseases and that it is important to keep them separate-- otherwise when friends and relatives  discuss their own cancer experiences with Donna Robbins confusion can result. Similarly we explained that if breast cancer spreads to the bone or liver, the patient would not have bone cancer or liver cancer, but breast cancer in the bone and breast cancer in the liver: one cancer in three places-- not 3 different cancers which otherwise would have to be treated in 3 different ways.  We discussed the difference between local and systemic therapy. In terms of loco-regional treatment, lumpectomy plus radiation is equivalent to mastectomy as far as survival is concerned. For this reason, and because the cosmetic results are generally superior, we recommend breast conserving surgery.   We then discussed the rationale for systemic therapy. There is some risk that this cancer may have already spread to other parts of her body. Patients frequently ask at this point about bone scans, CAT scans and PET scans to find out if they have occult breast cancer somewhere else. The problem is that in early stage disease we are much more likely to find false positives then true cancers and this would expose the patient to unnecessary procedures as well as unnecessary radiation. Scans cannot answer the question the patient really would like to know, which is whether she has microscopic disease elsewhere in her body. For those reasons we do not recommend them.  Of course we would proceed to aggressive evaluation of any symptoms that might suggest  metastatic disease, but that is not the case here.  Next we went over the options for systemic therapy which are anti-estrogens, anti-HER-2 immunotherapy, and chemotherapy. Donna Robbins does not meet criteria for anti-HER-2 immunotherapy. She is a good candidate for anti-estrogens.  The question of chemotherapy is more complicated. Chemotherapy is most effective in rapidly growing, aggressive tumors. It is much less effective in lower-grade, slow growing cancers, like Donna Robbins 's. For that reason we are going to request an Oncotype from the definitive surgical sample, as suggested by NCCN guidelines. That will help [place] make a definitive decision regarding chemotherapy in this case.  Donna Robbins has a good understanding of the overall plan.  She agrees with it.  She knows the goal of treatment in her case is cure.  She knows to call for any other issue that may develop before her next visit here.  Total encounter time 65 minutes.Donna Robbins C. Magrinat, MD 11/15/2020 12:56 PM Medical Oncology and Hematology [facility] [place], [place] [address] Tel. [phone]    Fax. [phone]   This document serves as a record of services personally performed by Lurline Del, MD. It was created on his behalf by Wilburn Mylar, a trained medical scribe. The creation of this record is based on the scribe's personal observations and the provider's statements to them.   I, Lurline Del MD, have reviewed the above documentation for accuracy and completeness, and I agree with the above.    *Total Encounter Time as defined by the Centers for Medicare and Medicaid Services includes, in addition to the face-to-face time of a patient visit (documented in the note above) non-face-to-face time: obtaining and reviewing outside history, ordering and reviewing medications, tests or procedures, care coordination (communications with other health care professionals or caregivers) and documentation  in the medical record.

## 2020-11-15 NOTE — Progress Notes (Signed)
Radiation Oncology         [phone] ________________________________  Multidisciplinary Breast Oncology Clinic [facility]) Initial Outpatient Consultation  Name: Donna Robbins MRN: [id number]  Date: 11/15/2020  DOB: 1970-11-21  BT:CYELYHTM, Real Cons, MD  Coralie Keens, MD   REFERRING PHYSICIAN: Coralie Keens, MD  DIAGNOSIS: The encounter diagnosis was Malignant neoplasm of upper-outer quadrant of right breast in female, estrogen receptor positive ([place]).  Stage IA (cT1b, cN0, cM0)  Right Breast UOQ, Invasive Ductal Carcinoma with DCIS Carcinoma, ER+ / PR+ / Her2-, Grade 1    ICD-10-CM   1. Malignant neoplasm of upper-outer quadrant of right breast in female, estrogen receptor positive ([place])  C50.411    Z17.0       HISTORY OF PRESENT ILLNESS::Donna Robbins is a 50 y.o. female who is presenting to the office today for evaluation of her newly diagnosed breast cancer. She is accompanied by her husband. She is doing well overall.   She had routine screening mammography on 10/24/20 showing an indeterminate 0.8 cm focal asymmetry in the right breast. She underwent unilateral right breast diagnostic mammography at [facility] on 11/06/20 showing the 0.8 cm irregular mass in the right breast lateral aspect as indeterminate. Right breast ultrasonography at [facility] on 11/06/20 showed the irregular mass in the right breast as highly suggestive of malignancy .  Right breast biopsy at the 9 o'clock position, 10 cmfn at a posterior depth on 11/08/20 showed: grade 1/2 invasive ductal carcinoma with DCIS. Prognostic indicators significant for: estrogen receptor, 100% positive and progesterone receptor, 100% positive, both with strong staining intensity. Proliferation marker Ki67 at 1%. HER2 negative.  Menarche: 50 years old Age at first live birth: 50 years old GP: 2 LMP: still having period Contraceptive: yes, age 44 to 54 HRT: none   The patient was referred today for  presentation in the multidisciplinary conference.  Radiology studies and pathology slides were presented there for review and discussion of treatment options.  A consensus was discussed regarding potential next steps.  PREVIOUS RADIATION THERAPY: No  PAST MEDICAL HISTORY:  Past Medical History:  Diagnosis Date   Allergy    Anxiety    Breast cancer ([place])    Depression    GERD (gastroesophageal reflux disease)    HEARING LOSS    Hx of adenomatous polyp of colon 07/18/2016   Irritable bowel syndrome    OBESITY     PAST SURGICAL HISTORY: Past Surgical History:  Procedure Laterality Date   CESAREAN SECTION     x's 2   CHOLECYSTECTOMY  2009   ENT surgery  2004    FAMILY HISTORY:  Family History  Problem Relation Age of Onset   Diabetes Mother    Hyperlipidemia Mother    Cancer Father        Angio sarcoma   Hyperlipidemia Father    Arthritis Maternal Grandmother    Lung cancer Maternal Grandmother    Lung cancer Maternal Grandfather    Lung cancer Paternal Grandmother    Arthritis Other    Colon cancer Neg Hx    Stomach cancer Neg Hx    Rectal cancer Neg Hx    Esophageal cancer Neg Hx    Liver cancer Neg Hx     SOCIAL HISTORY:  Social History   Socioeconomic History   Marital status: Married    Spouse name: Not on file   Number of children: 2   Years of education: Not on file   Highest education level: Not on  file  Occupational History   Occupation: Community education officer    Employer: [employer]  Tobacco Use   Smoking status: Never   Smokeless tobacco: Never  Substance and Sexual Activity   Alcohol use: Yes    Alcohol/week: 2.0 standard drinks    Types: 2 Glasses of wine per week    Comment: Rarely   Drug use: Yes    Types: Marijuana   Sexual activity: Not on file  Other Topics Concern   Not on file  Social History Narrative   Not on file   Social Determinants of Health   Financial Resource Strain: Not on file  Food Insecurity: Not on  file  Transportation Needs: Not on file  Physical Activity: Not on file  Stress: Not on file  Social Connections: Not on file    ALLERGIES:  Allergies  Allergen Reactions   Iodine Itching    REACTION: Itching   Sulfonamide Derivatives Itching and Rash    REACTION: Itching Rash    MEDICATIONS:  Current Outpatient Medications  Medication Sig Dispense Refill   cholecalciferol (VITAMIN D) 1000 units tablet Take 5,000 Units by mouth daily.      clindamycin (CLEOCIN T) 1 % external solution Apply 1 mL on the skin twice a day 60 mL 6   clindamycin (CLINDAGEL) 1 % gel APPLY 1 APPLICATION ON THE SKIN TWICE A DAY 60 g 2   clonazePAM (KLONOPIN) 0.5 MG tablet TAKE 1/2 TO 1 TABLET BY MOUTH DAILY AS NEEDED FOR ANXIETY 30 tablet 2   ferrous sulfate 325 (65 FE) MG EC tablet Take 325 mg by mouth daily.      Multiple Vitamin (MULTIVITAMIN) capsule Take 1 capsule by mouth daily.     nitroGLYCERIN (NITRODUR - DOSED IN MG/24 HR) 0.2 mg/hr patch APPLY 1/4 PATCH DAILY TO TENDON FOR TENDONITIS. 30 patch 1   vitamin B-12 (CYANOCOBALAMIN) 1000 MCG tablet      No current facility-administered medications for this encounter.    REVIEW OF SYSTEMS: A 10+ POINT REVIEW OF SYSTEMS WAS OBTAINED including neurology, dermatology, psychiatry, cardiac, respiratory, lymph, extremities, GI, GU, musculoskeletal, constitutional, reproductive, HEENT. On the provided form, she reports fatigue, wearing glasses, sinus problems, rash, joint pain, and anxiety. She denies any other symptoms.  She denies any pain within the right breast area nipple discharge or bleeding prior to diagnosis   PHYSICAL EXAM:   Vitals with BMI 11/15/2020  Height _0   Weight 199 lbs  BMI 35.32  Systolic 992  Diastolic 84  Pulse 77   Lungs are clear to auscultation bilaterally. Heart has regular rate and rhythm. No palpable cervical, supraclavicular, or axillary adenopathy. Abdomen soft, non-tender, normal bowel sounds. Breast: Left breast  with no palpable mass, nipple discharge, or bleeding. Right breast with small biopsy site in the LOQ and no palpable mass, nipple discharge or bleeding.    KPS = 100  100 - Normal; no complaints; no evidence of disease. 90   - Able to carry on normal activity; minor signs or symptoms of disease. 80   - Normal activity with effort; some signs or symptoms of disease. 32   - Cares for self; unable to carry on normal activity or to do active work. 60   - Requires occasional assistance, but is able to care for most of his personal needs. 50   - Requires considerable assistance and frequent medical care. 68   - Disabled; requires special care and assistance. 30   -  Severely disabled; hospital admission is indicated although death not imminent. 23   - Very sick; hospital admission necessary; active supportive treatment necessary. 10   - Moribund; fatal processes progressing rapidly. 0     - Dead  Karnofsky DA, Abelmann Clara, Craver LS and Burchenal JH [phone]) The use of the nitrogen mustards in the palliative treatment of carcinoma: with particular reference to bronchogenic carcinoma Cancer 1 634-56  LABORATORY DATA:  Lab Results  Component Value Date   WBC 6.6 11/15/2020   HGB 13.8 11/15/2020   HCT 42.2 11/15/2020   MCV 95.5 11/15/2020   PLT 338 11/15/2020   Lab Results  Component Value Date   NA 140 11/15/2020   K 4.1 11/15/2020   CL 103 11/15/2020   CO2 28 11/15/2020   Lab Results  Component Value Date   ALT 21 11/15/2020   AST 22 11/15/2020   ALKPHOS 62 11/15/2020   BILITOT 0.4 11/15/2020    PULMONARY FUNCTION TEST:   Recent Review Flowsheet Data   There is no flowsheet data to display.     RADIOGRAPHY: No results found.    IMPRESSION: Stage IA (cT1b, cN0, cM0)  Right Breast UOQ, Invasive Ductal Carcinoma with DCIS Carcinoma, ER+ / PR+ / Her2-, Grade 1   Patient will be a good candidate for breast conservation with radiotherapy to right breast. We discussed the general  course of radiation, potential side effects, and toxicities with radiation and the patient is interested in this approach.   Patient is hesitant of considering radiation therapy based on her family history of unusual malignancy, with her father developing angiosarcoma and her mother with melanoma of the orbit, she is afraid radiation may induce malignancy.   We discussed that the risk for this would be quite low. She is considering having mastectomy in the hopes of avoiding radiation therapy, realizing that radiation therapy would be recommended if her nodes are positive.     PLAN:  Right lumpectomy with sentinel node biopsy Oncotype  Adjuvant Radiation Therapy   Aromatase Inhibitor    ------------------------------------------------  Blair Promise, PhD, MD  This document serves as a record of services personally performed by Gery Pray, MD. It was created on his behalf by Roney Mans, a trained medical scribe. The creation of this record is based on the scribe's personal observations and the provider's statements to them. This document has been checked and approved by the attending provider.

## 2020-11-16 ENCOUNTER — Encounter: Payer: Self-pay | Admitting: *Deleted

## 2020-11-17 ENCOUNTER — Telehealth: Payer: Self-pay | Admitting: *Deleted

## 2020-11-17 ENCOUNTER — Encounter: Payer: Self-pay | Admitting: General Practice

## 2020-11-17 NOTE — Telephone Encounter (Signed)
Spoke to pt concerning [place] from 8.24.22. Denies questions regarding dx. Pt has decided she would like to pursue Mastectomy w/ reconstruction.  Physician team notified.  Discussed next steps in the new care plan. Received verbal understanding. Denies further questions or needs at this time.

## 2020-11-17 NOTE — Progress Notes (Signed)
[place] Psychosocial Distress Screening Spiritual Care  Met with Donna Robbins" by phone following [facility] to introduce [place] team/resources, reviewing distress screen per protocol.  The patient scored a 6 on the Psychosocial Distress Thermometer which indicates moderate distress. Also assessed for distress and other psychosocial needs.   ONCBCN DISTRESS SCREENING 11/17/2020  Screening Type Initial Screening  Distress experienced in past week (1-10) 6  Practical problem type Work/school  Emotional problem type Nervousness/Anxiety  Spiritual/Religous concerns type Facing my mortality  Information Concerns Type Lack of info about diagnosis;Lack of info about treatment;Lack of info about complementary therapy choices;Lack of info about maintaining fitness  Physical Problem type Sleep/insomnia  Referral to support programs Yes    Donna Robbins by phone for spiritual/emotional check-in following [place]. She was very receptive of call and is working hard to prepare mentally and emotionally to talk to her family about her diagnosis and treatment plan. (Her dad died of cancer, and her mom currently has cancer, so this is loaded.) Donna Robbins's children (11, 34) are starting middle school and college next week. We talked through strategies for conveying one's own confidence and gratitude in such conversations.  Donna Robbins is also working through an intuitive sense that she may want to choose mastectomy instead of lumpectomy in order to avoid radiation, which has caused some complicating factors in her family history. She plans to reach out to her nurse navigator, Donna Robbins/RN, for help in addressing this concern.   Follow up needed: No. Donna Robbins prefers to reach out to chaplain as needed/desired.   [place], [place], [facility] Pager [phone] Voicemail [phone]

## 2020-11-18 ENCOUNTER — Other Ambulatory Visit (HOSPITAL_COMMUNITY): Payer: Self-pay

## 2020-11-20 ENCOUNTER — Other Ambulatory Visit: Payer: Self-pay | Admitting: Internal Medicine

## 2020-11-20 ENCOUNTER — Encounter: Payer: Self-pay | Admitting: *Deleted

## 2020-11-20 ENCOUNTER — Other Ambulatory Visit (HOSPITAL_COMMUNITY): Payer: Self-pay

## 2020-11-20 DIAGNOSIS — C50411 Malignant neoplasm of upper-outer quadrant of right female breast: Secondary | ICD-10-CM

## 2020-11-20 DIAGNOSIS — Z170 Estrogen receptor positive status [ER+]: Secondary | ICD-10-CM

## 2020-11-20 MED ORDER — CLONAZEPAM 0.5 MG PO TABS
ORAL_TABLET | ORAL | 2 refills | Status: DC
  Filled 2020-11-20: qty 30, 30d supply, fill #0
  Filled 2021-03-15: qty 30, 30d supply, fill #1
  Filled 2021-05-07 – 2021-05-18 (×2): qty 30, 30d supply, fill #2

## 2020-11-21 ENCOUNTER — Other Ambulatory Visit (HOSPITAL_COMMUNITY): Payer: Self-pay

## 2020-11-21 ENCOUNTER — Other Ambulatory Visit: Payer: Self-pay | Admitting: Physician Assistant

## 2020-11-21 MED ORDER — PREDNISONE 10 MG PO TABS
ORAL_TABLET | ORAL | 0 refills | Status: DC
  Filled 2020-11-21: qty 37, 14d supply, fill #0

## 2020-11-21 MED ORDER — CEPHALEXIN 500 MG PO CAPS
ORAL_CAPSULE | ORAL | 0 refills | Status: DC
  Filled 2020-11-21: qty 15, 5d supply, fill #0

## 2020-11-21 NOTE — Progress Notes (Signed)
error 

## 2020-11-22 ENCOUNTER — Encounter: Payer: Self-pay | Admitting: Plastic Surgery

## 2020-11-22 ENCOUNTER — Ambulatory Visit: Payer: 59 | Admitting: Plastic Surgery

## 2020-11-22 ENCOUNTER — Other Ambulatory Visit: Payer: Self-pay

## 2020-11-22 VITALS — BP 126/89 | HR 74 | Ht 63.0 in | Wt 196.4 lb

## 2020-11-22 DIAGNOSIS — Z170 Estrogen receptor positive status [ER+]: Secondary | ICD-10-CM

## 2020-11-22 DIAGNOSIS — C50411 Malignant neoplasm of upper-outer quadrant of right female breast: Secondary | ICD-10-CM | POA: Diagnosis not present

## 2020-11-22 NOTE — Progress Notes (Signed)
Referring Provider Donna Koch, MD [address] [place],  [place] [address]   CC:  Chief Complaint  Patient presents with   consult      Donna Robbins is an 50 y.o. female.  HPI: Patient presents to discuss breast reconstruction.  She has newly diagnosed DCIS in the right breast.  She initially went down the path of breast conservation therapy but had some reservations about the requirement for radiation.  She has had a family member who developed angiosarcoma potentially as a result of radiation and another family member with a rare melanoma and wants to explore mastectomy and reconstruction.  She has yet to discuss this in detail with her breast surgeon Dr. Ninfa Robbins.  Allergies  Allergen Reactions   Iodine Itching    REACTION: Itching   Sulfonamide Derivatives Itching and Rash    REACTION: Itching Rash    Outpatient Encounter Medications as of 11/22/2020  Medication Sig   cephALEXin (KEFLEX) 500 MG capsule Take 1 capsule by mouth 3 times daily.   cholecalciferol (VITAMIN D) 1000 units tablet Take 5,000 Units by mouth daily.    clindamycin (CLEOCIN T) 1 % external solution Apply 1 mL on the skin twice a day   clindamycin (CLINDAGEL) 1 % gel APPLY 1 APPLICATION ON THE SKIN TWICE A DAY   clonazePAM (KLONOPIN) 0.5 MG tablet TAKE 1/2 TO 1 TABLET BY MOUTH DAILY AS NEEDED FOR ANXIETY   ferrous sulfate 325 (65 FE) MG EC tablet Take 325 mg by mouth daily.    Multiple Vitamin (MULTIVITAMIN) capsule Take 1 capsule by mouth daily.   nitroGLYCERIN (NITRODUR - DOSED IN MG/24 HR) 0.2 mg/hr patch APPLY 1/4 PATCH DAILY TO TENDON FOR TENDONITIS.   predniSONE (DELTASONE) 10 MG tablet Days 1 - 4 take 4 tablets daily--  Days 5 - 8 take 3 tablets daily-- Days 9 - 11 take 2 tablets daily-- Days 12 - 14 take 1 tablet daily   vitamin B-12 (CYANOCOBALAMIN) 1000 MCG tablet    No facility-administered encounter medications on file as of 11/22/2020.     Past Medical History:   Diagnosis Date   Allergy    Anxiety    Breast cancer ([place])    Depression    GERD (gastroesophageal reflux disease)    HEARING LOSS    Hx of adenomatous polyp of colon 07/18/2016   Irritable bowel syndrome    OBESITY     Past Surgical History:  Procedure Laterality Date   CESAREAN SECTION     x's 2   CHOLECYSTECTOMY  2009   ENT surgery  2004    Family History  Problem Relation Age of Onset   Diabetes Mother    Hyperlipidemia Mother    Cancer Father        Angio sarcoma   Hyperlipidemia Father    Arthritis Maternal Grandmother    Lung cancer Maternal Grandmother    Lung cancer Maternal Grandfather    Lung cancer Paternal Grandmother    Arthritis Other    Colon cancer Neg Hx    Stomach cancer Neg Hx    Rectal cancer Neg Hx    Esophageal cancer Neg Hx    Liver cancer Neg Hx     Social History   Social History Narrative   Not on file  Denies tobacco use  Review of Systems General: Denies fevers, chills, weight loss CV: Denies chest pain, shortness of breath, palpitations  Physical Exam Vitals with BMI 11/22/2020 11/15/2020 08/01/2020  Height '5\' 3"'$  '5\' 3"'$  '5\' 3"'$   Weight 196 lbs 6 oz 199 lbs 206 lbs 10 oz  BMI 34.8 [id number]  Systolic [id number]  Diastolic 89 84 94  Pulse 74 77 82    General:  No acute distress,  Alert and oriented, Non-Toxic, Normal speech and affect Breast: She has grade 3 ptosis.  She has reasonable symmetry.  No obvious scars.  Base width is about 12.5 cm.  Assessment/Plan Had a long discussion with the patient about her options for reconstruction.  Should she choose to undergo mastectomy we would have to consider unilateral versus bilateral and skin sparing versus nipple sparing.  In regards to the nipple sparing if she is a candidate from a cancer standpoint she would need to have a reduction prior to her mastectomy to make her a candidate from a reconstructive standpoint.  Otherwise it would be difficult to accommodate the skin and get  the nipple areolar complex in the right position.  She is uncertain as to whether or not she would like to preserve her nipples and wants to talk about that further with Dr. Ninfa Robbins.  We briefly discussed autologous reconstructions but ultimately I recommended implant-based and she is in agreement with that thought process.  I explained I would more than likely place a prepectoral expander at the time of the mastectomy and gradually inflate this subsequently switched out to a permanent gel implant.  We discussed the risk that include bleeding, infection, damage to surrounding structures need for additional procedures.  We discussed the potential for wound healing complications that could affect the success of the reconstruction.  I discussed the general postoperative course and expectations.  She is fully understanding and is interested in moving forward.  Donna Robbins 11/22/2020, 1:43 PM

## 2020-11-23 ENCOUNTER — Other Ambulatory Visit (HOSPITAL_COMMUNITY): Payer: Self-pay

## 2020-11-23 ENCOUNTER — Encounter: Payer: Self-pay | Admitting: Oncology

## 2020-11-23 ENCOUNTER — Encounter: Payer: Self-pay | Admitting: Plastic Surgery

## 2020-11-24 ENCOUNTER — Other Ambulatory Visit: Payer: Self-pay | Admitting: Surgery

## 2020-11-24 DIAGNOSIS — Z853 Personal history of malignant neoplasm of breast: Secondary | ICD-10-CM

## 2020-11-29 ENCOUNTER — Encounter: Payer: Self-pay | Admitting: *Deleted

## 2020-11-30 ENCOUNTER — Other Ambulatory Visit: Payer: Self-pay | Admitting: Oncology

## 2020-11-30 DIAGNOSIS — L821 Other seborrheic keratosis: Secondary | ICD-10-CM | POA: Diagnosis not present

## 2020-11-30 DIAGNOSIS — L239 Allergic contact dermatitis, unspecified cause: Secondary | ICD-10-CM | POA: Diagnosis not present

## 2020-11-30 NOTE — Progress Notes (Signed)
Did not left a message which I was not able to access.  By the time I called her back today she had her questions answered.  She has decided to have mastectomy, no reconstruction.  She will see me late October to discuss antiestrogens

## 2020-12-01 ENCOUNTER — Telehealth: Payer: Self-pay | Admitting: Oncology

## 2020-12-01 NOTE — Telephone Encounter (Signed)
R/s November appt per sch msg. Called and spoke with patient. Confirmed new date and time

## 2020-12-04 ENCOUNTER — Ambulatory Visit (HOSPITAL_BASED_OUTPATIENT_CLINIC_OR_DEPARTMENT_OTHER): Admit: 2020-12-04 | Payer: 59 | Admitting: Surgery

## 2020-12-04 ENCOUNTER — Encounter (HOSPITAL_BASED_OUTPATIENT_CLINIC_OR_DEPARTMENT_OTHER): Payer: Self-pay

## 2020-12-04 ENCOUNTER — Other Ambulatory Visit (HOSPITAL_COMMUNITY): Payer: 59

## 2020-12-04 SURGERY — BREAST LUMPECTOMY WITH RADIOACTIVE SEED AND SENTINEL LYMPH NODE BIOPSY
Anesthesia: General | Site: Breast | Laterality: Right

## 2020-12-05 NOTE — Pre-Procedure Instructions (Signed)
Surgical Instructions   Your procedure is scheduled on Wednesday, September 21st. Report to [facility] Main Entrance "A" at 06:30 A.M., then check in with the Admitting office. Call this number if you have problems the morning of surgery: [phone]   If you have any questions prior to your surgery date call [phone]: Open Monday-Friday 8am-4pm   Remember: Do not eat after midnight the night before your surgery  You may drink clear liquids until 05:30 AM the morning of your surgery.   Clear liquids allowed are: Water, Non-Citrus Juices (without pulp), Carbonated Beverages, Clear Tea, Black Coffee ONLY (NO MILK, CREAM OR POWDERED CREAMER of any kind), and Gatorade    Patient Instructions  The night before surgery:  No food after midnight. ONLY clear liquids after midnight  The day of surgery (if you do NOT have diabetes):  Drink ONE (1) Pre-Surgery Clear Ensure by 05:30 AM the morning of surgery. Drink in one sitting. Do not sip.  This drink was given to you during your hospital  pre-op appointment visit.  Nothing else to drink after completing the  Pre-Surgery Clear Ensure.   If you have questions, please contact your surgeon's office.      Take these medicines the morning of surgery with A SIP OF WATER  predniSONE (DELTASONE)- if you are still taking this medication    If needed: acetaminophen (TYLENOL) clonazePAM (KLONOPIN)    As of today, STOP taking any Aspirin (unless otherwise instructed by your surgeon) Aleve, Naproxen, Ibuprofen, Motrin, Advil, Goody's, BC's, all herbal medications, fish oil, and all vitamins.            Do NOT Smoke (Tobacco/Vaping)  24 hours prior to your procedure If you use a CPAP at night, you may bring your mask for your overnight stay.   Contacts, glasses, dentures or bridgework may not be worn into surgery, please bring cases for these belongings   For patients admitted to the hospital, discharge time will be determined by  your treatment team.   Patients discharged the day of surgery will not be allowed to drive home, and someone needs to stay with them for 24 hours.  ONLY 1 SUPPORT PERSON MAY BE PRESENT WHILE YOU ARE IN SURGERY. IF YOU ARE TO BE ADMITTED ONCE YOU ARE IN YOUR ROOM YOU WILL BE ALLOWED TWO (2) VISITORS.  Minor children may have two parents present. Special consideration for safety and communication needs will be reviewed on a case by case basis.  Special instructions:    Oral Hygiene is also important to reduce your risk of infection.  Remember - BRUSH YOUR TEETH THE MORNING OF SURGERY WITH YOUR REGULAR TOOTHPASTE   [facility]- Preparing For Surgery  Before surgery, you can play an important role. Because skin is not sterile, your skin needs to be as free of germs as possible. You can reduce the number of germs on your skin by washing with CHG (chlorahexidine gluconate) Soap before surgery.  CHG is an antiseptic cleaner which kills germs and bonds with the skin to continue killing germs even after washing.     Please do not use if you have an allergy to CHG or antibacterial soaps. If your skin becomes reddened/irritated stop using the CHG.  Do not shave (including legs and underarms) for at least 48 hours prior to first CHG shower. It is OK to shave your face.  Please follow these instructions carefully.     Shower the NIGHT BEFORE SURGERY and the MORNING OF  SURGERY with CHG Soap.   If you chose to wash your hair, wash your hair first as usual with your normal shampoo. After you shampoo, rinse your hair and body thoroughly to remove the shampoo.  Then [employer] and genitals (private parts) with your normal soap and rinse thoroughly to remove soap.  After that Use CHG Soap as you would any other liquid soap. You can apply CHG directly to the skin and wash gently with a scrungie or a clean washcloth.   Apply the CHG Soap to your body ONLY FROM THE NECK DOWN.  Do not use on open wounds or open  sores. Avoid contact with your eyes, ears, mouth and genitals (private parts). Wash Face and genitals (private parts)  with your normal soap.   Wash thoroughly, paying special attention to the area where your surgery will be performed.  Thoroughly rinse your body with warm water from the neck down.  DO NOT shower/wash with your normal soap after using and rinsing off the CHG Soap.  Pat yourself dry with a CLEAN TOWEL.  Wear CLEAN PAJAMAS to bed the night before surgery  Place CLEAN SHEETS on your bed the night before your surgery  DO NOT SLEEP WITH PETS.    Day of Surgery: Do not wear jewelry or makeup Do not wear lotions, powders, perfumes, or deodorant. Do not shave 48 hours prior to surgery.   Do not bring valuables to the hospital. [facility] is not responsible for any belongings or valuables. Do not wear nail polish, gel polish, artificial nails, or any other type of covering on natural nails including finger and toenails. If patients have artificial nails, gel coating, etc. that need to be removed by a nail salon please have this removed prior to surgery or surgery may need to be canceled/delayed if the surgeon/ anesthesia feels like the patient is unable to be adequately monitored. Take a shower with CHG soap. Wear Clean/Comfortable clothing the morning of surgery Do not apply any deodorants/lotions.   Remember to brush your teeth WITH YOUR REGULAR TOOTHPASTE.   Please read over the following fact sheets that you were given.

## 2020-12-06 ENCOUNTER — Encounter: Payer: Self-pay | Admitting: Oncology

## 2020-12-06 ENCOUNTER — Other Ambulatory Visit: Payer: Self-pay

## 2020-12-06 ENCOUNTER — Encounter (HOSPITAL_COMMUNITY): Payer: Self-pay

## 2020-12-06 ENCOUNTER — Encounter (HOSPITAL_COMMUNITY)
Admission: RE | Admit: 2020-12-06 | Discharge: 2020-12-06 | Disposition: A | Discharge status: Home / Self Care | Payer: 59 | Source: Ambulatory Visit | Attending: Surgery | Admitting: Surgery

## 2020-12-06 DIAGNOSIS — Z01818 Encounter for other preprocedural examination: Secondary | ICD-10-CM | POA: Diagnosis not present

## 2020-12-06 NOTE — Progress Notes (Addendum)
PCP - Pricilla Holm, MD Cardiologist - pt denies  PPM/ICD - n/a  Chest x-ray - n/a EKG - n/a Stress Test - pt denies ECHO - pt denies Cardiac Cath - pt denies  Sleep Study - pt reports she was dx w/sleep apnea over 20 years ago, and had ENT surgery in 2004 to treat sleep apnea. Denies having sleep apnea currently, does not use cpap  Blood Thinner Instructions: n/a Aspirin Instructions: n/a  ERAS Protcol - yes PRE-SURGERY Ensure or G2- ensure  COVID TEST- scheduled for 12/11/20 at 0945am  Anesthesia review: yes, sentinel node biopsy  Patient denies shortness of breath, fever, cough and chest pain at PAT appointment   All instructions explained to the patient, with a verbal understanding of the material. Patient agrees to go over the instructions while at home for a better understanding. Patient also instructed to self quarantine after being tested for COVID-19. The opportunity to ask questions was provided.

## 2020-12-11 ENCOUNTER — Other Ambulatory Visit (HOSPITAL_COMMUNITY)
Admission: RE | Admit: 2020-12-11 | Discharge: 2020-12-11 | Disposition: A | Discharge status: Home / Self Care | Payer: 59 | Source: Ambulatory Visit | Attending: Surgery | Admitting: Surgery

## 2020-12-11 DIAGNOSIS — Z01812 Encounter for preprocedural laboratory examination: Secondary | ICD-10-CM | POA: Diagnosis not present

## 2020-12-11 DIAGNOSIS — Z20822 Contact with and (suspected) exposure to covid-19: Secondary | ICD-10-CM | POA: Diagnosis not present

## 2020-12-11 LAB — SARS CORONAVIRUS 2 (TAT 6-24 HRS): SARS Coronavirus 2: NEGATIVE

## 2020-12-12 NOTE — H&P (Signed)
PROVIDER:  Beverlee Nims, MD   MRN: [id number] DOB: Apr 13, 1970 DATE OF ENCOUNTER: 11/15/2020   Subjective    Chief Complaint: Breast Cancer       History of Present Illness: Donna Robbins is a 50 y.o. female who is seen today as an office consultation at the request of Dr. Pasty Arch for evaluation of Breast Cancer .     This is a 50 year old female recently diagnosed with right breast cancer.  She had undergone screening mammography when she was found to have a mass and calcifications at the 9 o'clock position of the right breast 10 cm from the nipple.  The mass measures approximately 8 mm and calcifications measuring a total of 1.4 cm.  she underwent a biopsy of this showing both invasive and in situ right breast ductal carcinoma.  It was 100% ER/PR positive, HER2 negative, with a Ki-67 of 1%.  She is otherwise healthy without complaints.  She has had no previous problems regarding her breast.  Family history is negative for breast cancer     Review of Systems: A complete review of systems was obtained from the patient.  I have reviewed this information and discussed as appropriate with the patient.  See HPI as well for other ROS.   Review of Systems  All other systems reviewed and are negative.       Medical History:     Past Medical History:  Diagnosis Date   Anxiety     History of cancer           Patient Active Problem List  Diagnosis   Malignant neoplasm of upper-outer quadrant of right breast in female, estrogen receptor positive (CMS-HCC)           Past Surgical History:  Procedure Laterality Date   CESAREAN SECTION   2005   CESAREAN SECTION   2010   CHOLECYSTECTOMY   2009   ENT Surgery   2005           Allergies  Allergen Reactions   Iodine Itching      REACTION: Itching   Sulfa (Sulfonamide Antibiotics) Rash      REACTION: Itching Rash            Current Outpatient Medications on File Prior to Visit  Medication Sig Dispense Refill    clindamycin (CLEOCIN T) 1 % topical solution Apply topically       clonazePAM (KLONOPIN) 0.5 MG tablet TAKE 1/2 TO 1 TABLET BY MOUTH DAILY AS NEEDED FOR ANXIETY       nitroGLYcerin (NITRODUR) 0.2 mg/hr patch APPLY 1/4 PATCH DAILY TO TENDON FOR TENDONITIS.        No current facility-administered medications on file prior to visit.           Family History  Problem Relation Age of Onset   Diabetes Mother     Obesity Brother     Hyperlipidemia (Elevated cholesterol) Brother     High blood pressure (Hypertension) Brother        Social History       Tobacco Use  Smoking Status Never Smoker  Smokeless Tobacco Never Used      Social History        Socioeconomic History   Marital status: Married  Tobacco Use   Smoking status: Never Smoker   Smokeless tobacco: Never Used  Substance and Sexual Activity   Alcohol use: Defer   Drug use: Defer      Objective:  Vitals:    11/15/20 1244  BP: 124/84  Pulse: 77  Resp: 18  Temp: 97.9 F (36.6 C)  SpO2: 98%     Body mass index is 35.25 kg/m.        Wt Readings from Last 3 Encounters:  11/15/20 199 lb (90.3 kg)  08/01/20 206 lb 9.6 oz (93.7 kg)  06/20/20 204 lb (92.5 kg)        Physical Exam    She appears well on exam   There are no palpable breast masses.  Nipple areolar complex is normal.  There is no axillary adenopathy     Labs, Imaging and Diagnostic Testing: I have reviewed her mammograms, ultrasound, and pathology results.   Assessment and Plan:  Diagnoses and all orders for this visit:   Malignant neoplasm of upper-outer quadrant of right breast in female, estrogen receptor positive (CMS-HCC)       I have reviewed her notes in the electronic medical records and we have discussed her this morning in her multidisciplinary breast cancer conference.  She has both invasive and in situ right breast ductal carcinoma.  I had a discussion with patient and her husband regarding diagnosis.  From a surgical  standpoint we again discussed breast conservation versus mastectomy and the long-term results of both.  Currently she is leaning toward breast conservation.  We next discussed proceeding with a right breast radioactive seed guided lumpectomy and sentinel node biopsy.  I discussed the procedure in detail.  We discussed the risk which includes but is not limited to bleeding, infection, injury to surrounding structures, lymphedema, the need for further procedures if lymph nodes or margins are positive, cardiopulmonary issues, DVT, postoperative recovery, etc.  At this point we will go and schedule surgery which she will call back should she change her mind regarding goals conservation versus mastectomy.  Addendum: After discussing her case thoroughly with the oncologist, surgery, plastic surgery, and her husband, she wished to proceed with a right mastectomy without reconstruction as well as a sentinel lymph node biopsy.  Surgery has been scheduled   Rosland Riding Georgina Pillion, MD

## 2020-12-13 ENCOUNTER — Encounter (HOSPITAL_COMMUNITY)
Admission: RE | Admit: 2020-12-13 | Discharge: 2020-12-13 | Disposition: A | Discharge status: Home / Self Care | Payer: 59 | Source: Ambulatory Visit | Attending: Surgery | Admitting: Surgery

## 2020-12-13 ENCOUNTER — Encounter (HOSPITAL_COMMUNITY)
Admission: RE | Disposition: A | Discharge status: Home / Self Care | Payer: Self-pay | Source: Home / Self Care | Attending: Surgery

## 2020-12-13 ENCOUNTER — Ambulatory Visit (HOSPITAL_COMMUNITY): Payer: 59 | Admitting: Physician Assistant

## 2020-12-13 ENCOUNTER — Encounter (HOSPITAL_COMMUNITY): Payer: Self-pay | Admitting: Surgery

## 2020-12-13 ENCOUNTER — Observation Stay (HOSPITAL_COMMUNITY)
Admission: RE | Admit: 2020-12-13 | Discharge: 2020-12-14 | Disposition: A | Discharge status: Home / Self Care | Payer: 59 | Attending: Surgery | Admitting: Surgery

## 2020-12-13 ENCOUNTER — Ambulatory Visit (HOSPITAL_COMMUNITY): Payer: 59 | Admitting: Anesthesiology

## 2020-12-13 ENCOUNTER — Other Ambulatory Visit: Payer: Self-pay

## 2020-12-13 DIAGNOSIS — Z170 Estrogen receptor positive status [ER+]: Secondary | ICD-10-CM | POA: Insufficient documentation

## 2020-12-13 DIAGNOSIS — Z9011 Acquired absence of right breast and nipple: Secondary | ICD-10-CM

## 2020-12-13 DIAGNOSIS — Z853 Personal history of malignant neoplasm of breast: Secondary | ICD-10-CM

## 2020-12-13 DIAGNOSIS — C50911 Malignant neoplasm of unspecified site of right female breast: Secondary | ICD-10-CM | POA: Diagnosis not present

## 2020-12-13 DIAGNOSIS — F418 Other specified anxiety disorders: Secondary | ICD-10-CM | POA: Diagnosis not present

## 2020-12-13 DIAGNOSIS — C50411 Malignant neoplasm of upper-outer quadrant of right female breast: Secondary | ICD-10-CM | POA: Diagnosis not present

## 2020-12-13 DIAGNOSIS — G8918 Other acute postprocedural pain: Secondary | ICD-10-CM | POA: Diagnosis not present

## 2020-12-13 HISTORY — PX: MASTECTOMY W/ SENTINEL NODE BIOPSY: SHX2001

## 2020-12-13 LAB — POCT PREGNANCY, URINE: Preg Test, Ur: NEGATIVE

## 2020-12-13 SURGERY — MASTECTOMY WITH SENTINEL LYMPH NODE BIOPSY
Anesthesia: General | Site: Breast | Laterality: Right

## 2020-12-13 MED ORDER — ONDANSETRON HCL 4 MG/2ML IJ SOLN: 4.0000 mg | Freq: Four times a day (QID) | INTRAMUSCULAR | Status: DC | PRN

## 2020-12-13 MED ORDER — LIDOCAINE HCL (PF) 2 % IJ SOLN
INTRAMUSCULAR | Status: AC
  Filled 2020-12-13: qty 5

## 2020-12-13 MED ORDER — FENTANYL CITRATE (PF) 100 MCG/2ML IJ SOLN
INTRAMUSCULAR | Status: AC
  Filled 2020-12-13: qty 2

## 2020-12-13 MED ORDER — SODIUM CHLORIDE (PF) 0.9 % IJ SOLN
INTRAMUSCULAR | Status: AC
  Filled 2020-12-13: qty 10

## 2020-12-13 MED ORDER — TECHNETIUM TC 99M TILMANOCEPT KIT
1.0000 | PACK | Freq: Once | INTRAVENOUS | Status: AC | PRN
  Administered 2020-12-13: 1 via INTRADERMAL

## 2020-12-13 MED ORDER — MAGTRACE LYMPHATIC TRACER
INTRAMUSCULAR | Status: DC | PRN
  Administered 2020-12-13: 2 mL via INTRAMUSCULAR

## 2020-12-13 MED ORDER — MIDAZOLAM HCL 2 MG/2ML IJ SOLN
INTRAMUSCULAR | Status: AC
  Filled 2020-12-13: qty 2

## 2020-12-13 MED ORDER — FENTANYL CITRATE (PF) 250 MCG/5ML IJ SOLN
INTRAMUSCULAR | Status: AC
  Filled 2020-12-13: qty 5

## 2020-12-13 MED ORDER — OXYCODONE HCL 5 MG PO TABS: 5.0000 mg | ORAL_TABLET | Freq: Once | ORAL | Status: DC | PRN

## 2020-12-13 MED ORDER — PHENYLEPHRINE HCL (PRESSORS) 10 MG/ML IV SOLN
INTRAVENOUS | Status: DC | PRN
  Administered 2020-12-13 (×4): 80 ug via INTRAVENOUS

## 2020-12-13 MED ORDER — DEXAMETHASONE SODIUM PHOSPHATE 10 MG/ML IJ SOLN
INTRAMUSCULAR | Status: DC | PRN
  Administered 2020-12-13: 10 mg via INTRAVENOUS

## 2020-12-13 MED ORDER — METHYLENE BLUE 0.5 % INJ SOLN
INTRAVENOUS | Status: AC
  Filled 2020-12-13: qty 10

## 2020-12-13 MED ORDER — LIDOCAINE 2% (20 MG/ML) 5 ML SYRINGE
INTRAMUSCULAR | Status: DC | PRN
  Administered 2020-12-13: 60 mg via INTRAVENOUS

## 2020-12-13 MED ORDER — ONDANSETRON HCL 4 MG/2ML IJ SOLN
INTRAMUSCULAR | Status: AC
  Filled 2020-12-13: qty 2

## 2020-12-13 MED ORDER — 0.9 % SODIUM CHLORIDE (POUR BTL) OPTIME
TOPICAL | Status: DC | PRN
  Administered 2020-12-13 (×2): 1000 mL

## 2020-12-13 MED ORDER — OXYCODONE HCL 5 MG/5ML PO SOLN: 5.0000 mg | Freq: Once | ORAL | Status: DC | PRN

## 2020-12-13 MED ORDER — BUPIVACAINE HCL (PF) 0.5 % IJ SOLN
INTRAMUSCULAR | Status: DC | PRN
  Administered 2020-12-13: 15 mL

## 2020-12-13 MED ORDER — CEFAZOLIN SODIUM-DEXTROSE 2-4 GM/100ML-% IV SOLN
2.0000 g | INTRAVENOUS | Status: AC
  Administered 2020-12-13: 2 g via INTRAVENOUS
  Filled 2020-12-13: qty 100

## 2020-12-13 MED ORDER — CHLORHEXIDINE GLUCONATE CLOTH 2 % EX PADS: 6.0000 | MEDICATED_PAD | Freq: Once | CUTANEOUS | Status: DC

## 2020-12-13 MED ORDER — FENTANYL CITRATE (PF) 250 MCG/5ML IJ SOLN
INTRAMUSCULAR | Status: DC | PRN
  Administered 2020-12-13 (×3): 50 ug via INTRAVENOUS

## 2020-12-13 MED ORDER — EPHEDRINE SULFATE 50 MG/ML IJ SOLN
INTRAMUSCULAR | Status: DC | PRN
  Administered 2020-12-13: 5 mg via INTRAVENOUS

## 2020-12-13 MED ORDER — ONDANSETRON HCL 4 MG/2ML IJ SOLN
INTRAMUSCULAR | Status: DC | PRN
  Administered 2020-12-13: 4 mg via INTRAVENOUS

## 2020-12-13 MED ORDER — ROCURONIUM BROMIDE 10 MG/ML (PF) SYRINGE
PREFILLED_SYRINGE | INTRAVENOUS | Status: AC
  Filled 2020-12-13: qty 10

## 2020-12-13 MED ORDER — PROPOFOL 10 MG/ML IV BOLUS
INTRAVENOUS | Status: AC
  Filled 2020-12-13: qty 20

## 2020-12-13 MED ORDER — PROPOFOL 10 MG/ML IV BOLUS
INTRAVENOUS | Status: DC | PRN
Start: 2020-12-13 — End: 2020-12-13
  Administered 2020-12-13: 50 mg via INTRAVENOUS
  Administered 2020-12-13: 150 mg via INTRAVENOUS

## 2020-12-13 MED ORDER — ONDANSETRON 4 MG PO TBDP: 4.0000 mg | ORAL_TABLET | Freq: Four times a day (QID) | ORAL | Status: DC | PRN

## 2020-12-13 MED ORDER — TRAMADOL HCL 50 MG PO TABS
50.0000 mg | ORAL_TABLET | Freq: Four times a day (QID) | ORAL | Status: DC | PRN
  Administered 2020-12-13: 50 mg via ORAL
  Filled 2020-12-13: qty 1

## 2020-12-13 MED ORDER — HYDROMORPHONE HCL 1 MG/ML IJ SOLN
1.0000 mg | INTRAMUSCULAR | Status: DC | PRN
Start: 2020-12-13 — End: 2020-12-14

## 2020-12-13 MED ORDER — CHLORHEXIDINE GLUCONATE 0.12 % MT SOLN
15.0000 mL | Freq: Once | OROMUCOSAL | Status: AC
  Administered 2020-12-13: 15 mL via OROMUCOSAL
  Filled 2020-12-13: qty 15

## 2020-12-13 MED ORDER — ACETAMINOPHEN 500 MG PO TABS
1000.0000 mg | ORAL_TABLET | Freq: Four times a day (QID) | ORAL | Status: DC
  Administered 2020-12-13 – 2020-12-14 (×4): 1000 mg via ORAL
  Filled 2020-12-13 (×4): qty 2

## 2020-12-13 MED ORDER — POTASSIUM CHLORIDE IN NACL 20-0.9 MEQ/L-% IV SOLN
INTRAVENOUS | Status: DC
  Filled 2020-12-13 (×2): qty 1000

## 2020-12-13 MED ORDER — FENTANYL CITRATE (PF) 100 MCG/2ML IJ SOLN
25.0000 ug | INTRAMUSCULAR | Status: DC | PRN
  Administered 2020-12-13 (×2): 50 ug via INTRAVENOUS

## 2020-12-13 MED ORDER — METHOCARBAMOL 500 MG PO TABS
500.0000 mg | ORAL_TABLET | Freq: Four times a day (QID) | ORAL | Status: DC | PRN
  Administered 2020-12-13: 500 mg via ORAL
  Filled 2020-12-13: qty 1

## 2020-12-13 MED ORDER — ORAL CARE MOUTH RINSE: 15.0000 mL | Freq: Once | OROMUCOSAL | Status: AC

## 2020-12-13 MED ORDER — LACTATED RINGERS IV SOLN: INTRAVENOUS | Status: DC

## 2020-12-13 MED ORDER — PROMETHAZINE HCL 25 MG/ML IJ SOLN: 6.2500 mg | INTRAMUSCULAR | Status: DC | PRN

## 2020-12-13 MED ORDER — ACETAMINOPHEN 500 MG PO TABS
1000.0000 mg | ORAL_TABLET | ORAL | Status: AC
  Administered 2020-12-13: 1000 mg via ORAL
  Filled 2020-12-13: qty 2

## 2020-12-13 MED ORDER — CLONAZEPAM 0.25 MG PO TBDP: 0.2500 mg | ORAL_TABLET | Freq: Every day | ORAL | Status: DC | PRN

## 2020-12-13 MED ORDER — MIDAZOLAM HCL 2 MG/2ML IJ SOLN
INTRAMUSCULAR | Status: DC | PRN
  Administered 2020-12-13: 2 mg via INTRAVENOUS

## 2020-12-13 MED ORDER — ENOXAPARIN SODIUM 40 MG/0.4ML IJ SOSY: 40.0000 mg | PREFILLED_SYRINGE | INTRAMUSCULAR | Status: DC

## 2020-12-13 MED ORDER — BUPIVACAINE LIPOSOME 1.3 % IJ SUSP
INTRAMUSCULAR | Status: DC | PRN
  Administered 2020-12-13: 10 mL

## 2020-12-13 MED ORDER — EPHEDRINE 5 MG/ML INJ
INTRAVENOUS | Status: AC
  Filled 2020-12-13: qty 5

## 2020-12-13 MED ORDER — ENSURE PRE-SURGERY PO LIQD: 296.0000 mL | Freq: Once | ORAL | Status: DC

## 2020-12-13 MED ORDER — DEXAMETHASONE SODIUM PHOSPHATE 10 MG/ML IJ SOLN
INTRAMUSCULAR | Status: AC
  Filled 2020-12-13: qty 1

## 2020-12-13 MED ORDER — OXYCODONE HCL 5 MG PO TABS
5.0000 mg | ORAL_TABLET | ORAL | Status: DC | PRN
  Administered 2020-12-13 – 2020-12-14 (×3): 5 mg via ORAL
  Filled 2020-12-13 (×3): qty 1

## 2020-12-13 SURGICAL SUPPLY — 53 items
ADH SKN CLS APL DERMABOND .7 (GAUZE/BANDAGES/DRESSINGS) ×1
APL PRP STRL LF DISP 70% ISPRP (MISCELLANEOUS) ×1
APPLIER CLIP 9.375 MED OPEN (MISCELLANEOUS) ×2
APR CLP MED 9.3 20 MLT OPN (MISCELLANEOUS) ×1
BAG COUNTER SPONGE SURGICOUNT (BAG) ×2 IMPLANT
BAG SPNG CNTER NS LX DISP (BAG) ×1
BINDER BREAST LRG (GAUZE/BANDAGES/DRESSINGS) IMPLANT
BINDER BREAST XLRG (GAUZE/BANDAGES/DRESSINGS) ×1 IMPLANT
BIOPATCH RED 1 DISK 7.0 (GAUZE/BANDAGES/DRESSINGS) ×2 IMPLANT
CANISTER SUCT 3000ML PPV (MISCELLANEOUS) ×2 IMPLANT
CHLORAPREP W/TINT 26 (MISCELLANEOUS) ×2 IMPLANT
CLIP APPLIE 9.375 MED OPEN (MISCELLANEOUS) ×1 IMPLANT
CNTNR URN SCR LID CUP LEK RST (MISCELLANEOUS) ×1 IMPLANT
CONT SPEC 4OZ STRL OR WHT (MISCELLANEOUS) ×2
COVER PROBE W GEL 5X96 (DRAPES) ×3 IMPLANT
COVER SURGICAL LIGHT HANDLE (MISCELLANEOUS) ×2 IMPLANT
DERMABOND ADVANCED (GAUZE/BANDAGES/DRESSINGS) ×1
DERMABOND ADVANCED .7 DNX12 (GAUZE/BANDAGES/DRESSINGS) ×1 IMPLANT
DRAIN CHANNEL 19F RND (DRAIN) ×2 IMPLANT
DRAPE CHEST BREAST 15X10 FENES (DRAPES) ×2 IMPLANT
DRSG PAD ABDOMINAL 8X10 ST (GAUZE/BANDAGES/DRESSINGS) ×4 IMPLANT
DRSG TEGADERM 4X4.75 (GAUZE/BANDAGES/DRESSINGS) ×2 IMPLANT
ELECT REM PT RETURN 9FT ADLT (ELECTROSURGICAL) ×2
ELECTRODE REM PT RTRN 9FT ADLT (ELECTROSURGICAL) ×1 IMPLANT
EVACUATOR SILICONE 100CC (DRAIN) ×2 IMPLANT
GAUZE SPONGE 4X4 12PLY STRL (GAUZE/BANDAGES/DRESSINGS) ×2 IMPLANT
GLOVE SURG SIGNA 7.5 PF LTX (GLOVE) ×2 IMPLANT
GOWN STRL REUS W/ TWL LRG LVL3 (GOWN DISPOSABLE) ×1 IMPLANT
GOWN STRL REUS W/ TWL XL LVL3 (GOWN DISPOSABLE) ×1 IMPLANT
GOWN STRL REUS W/TWL LRG LVL3 (GOWN DISPOSABLE) ×2
GOWN STRL REUS W/TWL XL LVL3 (GOWN DISPOSABLE) ×2
KIT BASIN OR (CUSTOM PROCEDURE TRAY) ×2 IMPLANT
KIT TURNOVER KIT B (KITS) ×2 IMPLANT
NDL 18GX1X1/2 (RX/OR ONLY) (NEEDLE) IMPLANT
NDL FILTER BLUNT 18X1 1/2 (NEEDLE) IMPLANT
NDL HYPO 25GX1X1/2 BEV (NEEDLE) IMPLANT
NEEDLE 18GX1X1/2 (RX/OR ONLY) (NEEDLE) IMPLANT
NEEDLE FILTER BLUNT 18X 1/2SAF (NEEDLE)
NEEDLE FILTER BLUNT 18X1 1/2 (NEEDLE) IMPLANT
NEEDLE HYPO 25GX1X1/2 BEV (NEEDLE) IMPLANT
NS IRRIG 1000ML POUR BTL (IV SOLUTION) ×2 IMPLANT
PACK GENERAL/GYN (CUSTOM PROCEDURE TRAY) ×2 IMPLANT
PAD ARMBOARD 7.5X6 YLW CONV (MISCELLANEOUS) ×2 IMPLANT
PENCIL SMOKE EVACUATOR (MISCELLANEOUS) ×1 IMPLANT
SPECIMEN JAR X LARGE (MISCELLANEOUS) ×2 IMPLANT
SUT ETHILON 2 0 FS 18 (SUTURE) ×2 IMPLANT
SUT MON AB 4-0 PC3 18 (SUTURE) ×2 IMPLANT
SUT SILK 2 0 SH (SUTURE) ×2 IMPLANT
SUT VIC AB 3-0 SH 18 (SUTURE) ×4 IMPLANT
SYR CONTROL 10ML LL (SYRINGE) IMPLANT
TOWEL GREEN STERILE (TOWEL DISPOSABLE) ×2 IMPLANT
TOWEL GREEN STERILE FF (TOWEL DISPOSABLE) ×2 IMPLANT
TRACER MAGTRACE VIAL (MISCELLANEOUS) ×1 IMPLANT

## 2020-12-13 NOTE — Op Note (Signed)
RIGHT MASTECTOMY WITH SENTINEL LYMPH NODE BIOPSY  Procedure Note  Donna Robbins 12/13/2020   Pre-op Diagnosis: RIGHT BREAST CANCER     Post-op Diagnosis: same  Procedure(s): RIGHT MASTECTOMY WITH DEEP AXILLARY SENTINEL LYMPH NODE BIOPSY  Surgeon(s): Coralie Keens, MD Carlena Hurl, PA-C  Anesthesia: General  Staff:  Circulator: Rometta Emery, RN Scrub Person: Rosanne Sack, RN Circulator Assistant: Mellody Drown, RN  Estimated Blood Loss: Minimal               Specimens: sent to path  Indications: This is a 50 year old female was found to have a abnormality of the right breast on recent screening mammography.  Showing a biopsy showing invasive ductal carcinoma.  After long discussion with the patient and her husband she wished to proceed with a right mastectomy and sentinel node biopsy without immediate reconstruction.  Procedure: The patient was identified in the preoperative holding area.  I prepped the right breast with alcohol and injected mag trace underneath the right nipple areolar complex.  She was taken to the operating room.  She is placed upon position on the operating table general anesthesia was induced.  Her right breast and axilla were then prepped and draped in the usual sterile fashion.  I made elliptical incision transversely across the chest from the medial breast toward the axilla staying widely around the nipple areolar complex with a scalpel.  I then dissected down to the breast tissue with electrocautery.  I dissected out the superior skin flap first staying just underneath the skin and dermis and moving toward the upper chest and clavicle area and then down toward the chest wall with the cautery.  I then dissected the inferior flap likewise going down to the inframammary ridge staying just underneath the skin and subcutaneous tissue.  I then took the dissection toward the axilla at both flaps.  Once the axilla was identified I used both the mag  trace probe and the neoprobe to identify several lymph nodes in the axilla.  At least 3 lymph nodes were removed as the sentinel lymph nodes from the deep axilla with the cautery and surgical clips.  These were sent to pathology for evaluation.  Again, both specimens removed had uptake of radioactive isotope as well as the nitrates.  Hemostasis was then achieved with cautery.  I then remove the breast from the chest wall dissecting medial lateral staying on top of the pectoralis muscle with the cautery.  Once the breast was completely removed I marked to the lateral margin with suture.  The breast was then sent to pathology for evaluation.  We irrigated the mastectomy site with saline.  Again hemostasis appeared to be achieved.  I next made a separate skin incision with a scalpel and placed a 19 [place] Blake drain into the incision and wound was then sutured in place with a 2-0 nylon suture.  We then closed the subcutaneous tissue with interrupted 3-0 Vicryl sutures and closed skin with a running 4-0 Monocryl.  Dermabond and a breast binder were applied.  The drain was placed to bulb suction.  The patient tolerated the procedure well.  All the counts were correct at the end of the procedure.  The patient was then extubated in the operating room and taken in stable addition to the recovery room.          Coralie Keens   Date: 12/13/2020  Time: 9:59 AM

## 2020-12-13 NOTE — Interval H&P Note (Signed)
History and Physical Interval Note:no change in H and P  12/13/2020 6:55 AM  Donna Robbins  has presented today for surgery, with the diagnosis of RIGHT BREAST CANCER.  The various methods of treatment have been discussed with the patient and family. After consideration of risks, benefits and other options for treatment, the patient has consented to  Procedure(s): RIGHT MASTECTOMY WITH SENTINEL LYMPH NODE BIOPSY (Right) as a surgical intervention.  The patient's history has been reviewed, patient examined, no change in status, stable for surgery.  I have reviewed the patient's chart and labs.  Questions were answered to the patient's satisfaction.     Coralie Keens

## 2020-12-13 NOTE — Anesthesia Postprocedure Evaluation (Signed)
Anesthesia Post Note  Patient: Donna Robbins  Procedure(s) Performed: RIGHT MASTECTOMY WITH SENTINEL LYMPH NODE BIOPSY (Right: Breast)     Patient location during evaluation: PACU Anesthesia Type: General Level of consciousness: awake and alert, patient cooperative and oriented Pain management: pain level controlled Vital Signs Assessment: post-procedure vital signs reviewed and stable Respiratory status: spontaneous breathing, nonlabored ventilation and respiratory function stable Cardiovascular status: blood pressure returned to baseline and stable Postop Assessment: no apparent nausea or vomiting Anesthetic complications: no   No notable events documented.  Last Vitals:  Vitals:   12/13/20 1037 12/13/20 1052  BP: 109/68 109/72  Pulse: 75 80  Resp: 13 18  Temp:  (!) 36.2 C  SpO2: 97% 97%    Last Pain:  Vitals:   12/13/20 1052  TempSrc:   PainSc: 5                  Emilian Stawicki,E. [place]

## 2020-12-13 NOTE — Transfer of Care (Signed)
Immediate Anesthesia Transfer of Care Note  Patient: Donna Robbins  Procedure(s) Performed: RIGHT MASTECTOMY WITH SENTINEL LYMPH NODE BIOPSY (Right: Breast)  Patient Location: PACU  Anesthesia Type:General  Level of Consciousness: awake, alert , oriented and patient cooperative  Airway & Oxygen Therapy: Patient Spontanous Breathing and Patient connected to nasal cannula oxygen  Post-op Assessment: Report given to RN, Post -op Vital signs reviewed and stable and Patient moving all extremities X 4  Post vital signs: Reviewed and stable  Last Vitals:  Vitals Value Taken Time  BP 105/70 12/13/20 1007  Temp    Pulse 76 12/13/20 1009  Resp 10 12/13/20 1009  SpO2 100 % 12/13/20 1009  Vitals shown include unvalidated device data.  Last Pain:  Vitals:   12/13/20 4944  TempSrc: Oral  PainSc:       Patients Stated Pain Goal: 2 (96/75/91 6384)  Complications: No notable events documented.

## 2020-12-13 NOTE — Anesthesia Procedure Notes (Signed)
Anesthesia Regional Block: Pectoralis block   Pre-Anesthetic Checklist: , timeout performed,  Correct Patient, Correct Site, Correct Laterality,  Correct Procedure, Correct Position, site marked,  Risks and benefits discussed,  Surgical consent,  Pre-op evaluation,  At surgeon's request and post-op pain management  Laterality: Right  Prep: chloraprep       Needles:  Injection technique: Single-shot  Needle Type: Echogenic Needle     Needle Length: 10cm  Needle Gauge: 21     Additional Needles:   Narrative:  Start time: 12/13/2020 8:09 AM End time: 12/13/2020 8:12 AM Injection made incrementally with aspirations every 5 mL.  Performed by: Personally  Anesthesiologist: Audry Pili, MD  Additional Notes: No pain on injection. No increased resistance to injection. Injection made in 5cc increments. Good needle visualization. Patient tolerated the procedure well.

## 2020-12-13 NOTE — Plan of Care (Signed)

## 2020-12-13 NOTE — Anesthesia Procedure Notes (Signed)
Procedure Name: LMA Insertion Date/Time: 12/13/2020 8:40 AM Performed by: Annamary Carolin, CRNA Pre-anesthesia Checklist: Patient identified, Emergency Drugs available, Suction available and Patient being monitored Patient Re-evaluated:Patient Re-evaluated prior to induction Oxygen Delivery Method: Circle System Utilized Preoxygenation: Pre-oxygenation with 100% oxygen Induction Type: IV induction Ventilation: Mask ventilation without difficulty LMA: LMA inserted LMA Size: 4.0 Number of attempts: 1 Airway Equipment and Method: Bite block Placement Confirmation: positive ETCO2 Tube secured with: Tape Dental Injury: Teeth and Oropharynx as per pre-operative assessment  Comments: With ease

## 2020-12-13 NOTE — Anesthesia Preprocedure Evaluation (Addendum)
Anesthesia Evaluation  Patient identified by MRN, date of birth, ID band Patient awake    Reviewed: Allergy & Precautions, NPO status , Patient's Chart, lab work & pertinent test results  History of Anesthesia Complications Negative for: history of anesthetic complications  Airway Mallampati: II  TM Distance: >3 FB Neck ROM: Full    Dental  (+) Dental Advisory Given, Teeth Intact   Pulmonary neg pulmonary ROS,    Pulmonary exam normal        Cardiovascular negative cardio ROS Normal cardiovascular exam     Neuro/Psych PSYCHIATRIC DISORDERS Anxiety Depression negative neurological ROS     GI/Hepatic Neg liver ROS,  IBS    Endo/Other   Obesity   Renal/GU negative Renal ROS     Musculoskeletal negative musculoskeletal ROS (+)   Abdominal   Peds  Hematology negative hematology ROS (+)   Anesthesia Other Findings Covid test negative   Reproductive/Obstetrics                            Anesthesia Physical Anesthesia Plan  ASA: 2  Anesthesia Plan: General   Post-op Pain Management:  Regional for Post-op pain   Induction: Intravenous  PONV Risk Score and Plan: 3 and Treatment may vary due to age or medical condition, Ondansetron, Dexamethasone and Midazolam  Airway Management Planned: LMA  Additional Equipment: None  Intra-op Plan:   Post-operative Plan: Extubation in OR  Informed Consent: I have reviewed the patients History and Physical, chart, labs and discussed the procedure including the risks, benefits and alternatives for the proposed anesthesia with the patient or authorized representative who has indicated his/her understanding and acceptance.     Dental advisory given  Plan Discussed with: CRNA and Anesthesiologist  Anesthesia Plan Comments:        Anesthesia Quick Evaluation

## 2020-12-14 ENCOUNTER — Other Ambulatory Visit (HOSPITAL_COMMUNITY): Payer: Self-pay

## 2020-12-14 ENCOUNTER — Encounter (HOSPITAL_COMMUNITY): Payer: Self-pay | Admitting: Surgery

## 2020-12-14 DIAGNOSIS — Z170 Estrogen receptor positive status [ER+]: Secondary | ICD-10-CM | POA: Diagnosis not present

## 2020-12-14 DIAGNOSIS — C50411 Malignant neoplasm of upper-outer quadrant of right female breast: Secondary | ICD-10-CM | POA: Diagnosis not present

## 2020-12-14 LAB — CBC WITH DIFFERENTIAL/PLATELET
Abs Immature Granulocytes: 0.01 10*3/uL (ref 0.00–0.07)
Basophils Absolute: 0.1 10*3/uL (ref 0.0–0.1)
Basophils Relative: 1 %
Eosinophils Absolute: 0.1 10*3/uL (ref 0.0–0.5)
Eosinophils Relative: 1 %
HCT: 32.6 % — ABNORMAL LOW (ref 36.0–46.0)
Hemoglobin: 10.8 g/dL — ABNORMAL LOW (ref 12.0–15.0)
Immature Granulocytes: 0 %
Lymphocytes Relative: 17 %
Lymphs Abs: 1.4 10*3/uL (ref 0.7–4.0)
MCH: 32.2 pg (ref 26.0–34.0)
MCHC: 33.1 g/dL (ref 30.0–36.0)
MCV: 97.3 fL (ref 80.0–100.0)
Monocytes Absolute: 0.8 10*3/uL (ref 0.1–1.0)
Monocytes Relative: 9 %
Neutro Abs: 6.2 10*3/uL (ref 1.7–7.7)
Neutrophils Relative %: 72 %
Platelets: 215 10*3/uL (ref 150–400)
RBC: 3.35 MIL/uL — ABNORMAL LOW (ref 3.87–5.11)
RDW: 13.3 % (ref 11.5–15.5)
WBC: 8.6 10*3/uL (ref 4.0–10.5)
nRBC: 0 % (ref 0.0–0.2)

## 2020-12-14 MED ORDER — OXYCODONE HCL 5 MG PO TABS
5.0000 mg | ORAL_TABLET | Freq: Four times a day (QID) | ORAL | 0 refills | Status: DC | PRN
  Filled 2020-12-14: qty 25, 7d supply, fill #0

## 2020-12-14 NOTE — Discharge Summary (Signed)
Physician Discharge Summary  Patient ID: Donna Robbins MRN: [id number] DOB/AGE: 1970-06-28 50 y.o.  Admit date: 12/13/2020 Discharge date: 12/14/2020  Admission Diagnoses:  Discharge Diagnoses:  Active Problems:   S/P mastectomy, right   Discharged Condition: good  Hospital Course: uneventful post op recovery.  Discharged home POD#1  Consults: None  Significant Diagnostic Studies:   Treatments: surgery: right mastectomy with sentinel node biopsy  Discharge Exam: Blood pressure 105/71, pulse 61, temperature 98 F (36.7 C), temperature source Oral, resp. rate 16, height 5\' 3"  (1.6 m), weight 86.2 kg, last menstrual period 12/04/2020, SpO2 99 %. General appearance: alert, cooperative, and no distress Resp: clear to auscultation bilaterally Cardio: regular rate and rhythm, S1, S2 normal, no murmur, click, rub or gallop Incision/Wound:right mastectomy flaps viable, no hematoma, drain serosang  Disposition: Discharge disposition: 01-Home or Self Care        Allergies as of 12/14/2020       Reactions   Iodine Itching   REACTION: Itching   Sulfonamide Derivatives Itching, Rash   REACTION: Itching Rash        Medication List     TAKE these medications    acetaminophen 500 MG tablet Commonly known as: TYLENOL Take 1,000 mg by mouth 2 (two) times daily as needed (joint/muscle pain.).   cephALEXin 500 MG capsule Commonly known as: KEFLEX Take 1 capsule by mouth 3 times daily.   cholecalciferol 1000 units tablet Commonly known as: VITAMIN D Take 1,000 Units by mouth in the morning.   clindamycin 1 % gel Commonly known as: CLINDAGEL APPLY 1 APPLICATION ON THE SKIN TWICE A DAY What changed:  how much to take when to take this reasons to take this   clindamycin 1 % external solution Commonly known as: CLEOCIN T Apply 1 mL on the skin twice a day What changed: Another medication with the same name was changed. Make sure you understand how and when to  take each.   clonazePAM 0.5 MG tablet Commonly known as: KLONOPIN TAKE 1/2 TO 1 TABLET BY MOUTH DAILY AS NEEDED FOR ANXIETY What changed:  how much to take how to take this when to take this reasons to take this   multivitamin with minerals Tabs tablet Take 1 tablet by mouth in the morning.   nitroGLYCERIN 0.2 mg/hr patch Commonly known as: NITRODUR - Dosed in mg/24 hr APPLY 1/4 PATCH DAILY TO TENDON FOR TENDONITIS.   oxyCODONE 5 MG immediate release tablet Commonly known as: Oxy IR/ROXICODONE Take 1 tablet (5 mg total) by mouth every 6 (six) hours as needed for moderate pain, severe pain or breakthrough pain.   predniSONE 10 MG tablet Commonly known as: DELTASONE Days 1 - 4 take 4 tablets daily--  Days 5 - 8 take 3 tablets daily-- Days 9 - 11 take 2 tablets daily-- Days 12 - 14 take 1 tablet daily   vitamin B-12 1000 MCG tablet Commonly known as: CYANOCOBALAMIN Take 1,000 mcg by mouth in the morning.        Follow-up Information     Coralie Keens, MD Follow up on 12/29/2020.   Specialty: General Surgery Why: For suture removal Contact information: [address] [place] [address] [phone]                 Signed: Coralie Keens 12/14/2020, 7:26 AM

## 2020-12-14 NOTE — Progress Notes (Signed)
Patient ID: Donna Robbins, female   DOB: Dec 08, 1970, 50 y.o.   MRN: [id number]   Doing well Plan:  discharge home today

## 2020-12-14 NOTE — Discharge Instructions (Signed)
[facility] [phone]  MASTECTOMY: POST OP INSTRUCTIONS  Always review your discharge instruction sheet given to you by the facility where your surgery was performed. IF YOU HAVE DISABILITY OR FAMILY LEAVE FORMS, YOU MUST BRING THEM TO THE OFFICE FOR PROCESSING.   DO NOT GIVE THEM TO YOUR DOCTOR. A prescription for pain medication may be given to you upon discharge.  Take your pain medication as prescribed, if needed.  If narcotic pain medicine is not needed, then you may take acetaminophen (Tylenol) or ibuprofen (Advil) as needed. Take your usually prescribed medications unless otherwise directed. If you need a refill on your pain medication, please contact your pharmacy.  They will contact our office to request authorization.  Prescriptions will not be filled after 5pm or on week-ends. You should follow a light diet the first few days after arrival home, such as soup and crackers, etc.  Resume your normal diet the day after surgery. Most patients will experience some swelling and bruising on the chest and underarm.  Ice packs will help.  Swelling and bruising can take several days to resolve.  It is common to experience some constipation if taking pain medication after surgery.  Increasing fluid intake and taking a stool softener (such as Colace) will usually help or prevent this problem from occurring.  A mild laxative (Milk of Magnesia or Miralax) should be taken according to package instructions if there are no bowel movements after 48 hours. Unless discharge instructions indicate otherwise, leave your bandage dry and in place until your next appointment in 3-5 days.  You may take a limited sponge bath.  No tube baths or showers until the drains are removed.  You may have steri-strips (small skin tapes) in place directly over the incision.  These strips should be left on the skin for 7-10 days.  If your surgeon used skin glue on the incision, you may shower in 24 hours.   The glue will flake off over the next 2-3 weeks.  Any sutures or staples will be removed at the office during your follow-up visit. DRAINS:  If you have drains in place, it is important to keep a list of the amount of drainage produced each day in your drains.  Before leaving the hospital, you should be instructed on drain care.  Call our office if you have any questions about your drains. ACTIVITIES:  You may resume regular (light) daily activities beginning the next day--such as daily self-care, walking, climbing stairs--gradually increasing activities as tolerated.  You may have sexual intercourse when it is comfortable.  Refrain from any heavy lifting or straining until approved by your doctor. You may drive when you are no longer taking prescription pain medication, you can comfortably wear a seatbelt, and you can safely maneuver your car and apply brakes. RETURN TO WORK:  __________________________________________________________ [name] should see your doctor in the office for a follow-up appointment approximately 3-5 days after your surgery.  Your doctor's nurse will typically make your follow-up appointment when she calls you with your pathology report.  Expect your pathology report 2-3 business days after your surgery.  You may call to check if you do not hear from [place] after three days.   OTHER INSTRUCTIONS: OK TO SHOWER ______________________________________________________________________________________________ ____________________________________________________________________________________________ WHEN TO CALL YOUR DOCTOR: Fever over 101.0 Nausea and/or vomiting Extreme swelling or bruising Continued bleeding from incision. Increased pain, redness, or drainage from the incision. The clinic staff is available to answer your questions during regular business hours.  Please don't hesitate to call and ask to speak to one of the nurses for clinical concerns.  If you have a medical emergency, go  to the nearest emergency room or call 911.  A surgeon from [facility] is always on call at the hospital. [address] [place], [place], [place]  [address] ? [address], [place], [place]   [address] [phone] ? [phone] ? FAX [phone] Web site: [email or web address]

## 2020-12-15 LAB — SURGICAL PATHOLOGY

## 2020-12-19 ENCOUNTER — Telehealth: Payer: Self-pay | Admitting: *Deleted

## 2020-12-19 ENCOUNTER — Encounter: Payer: Self-pay | Admitting: *Deleted

## 2020-12-19 NOTE — Telephone Encounter (Signed)
Received order for oncotype testing. Requisition faxed to pathology and [facility] °

## 2020-12-26 DIAGNOSIS — C50411 Malignant neoplasm of upper-outer quadrant of right female breast: Secondary | ICD-10-CM | POA: Diagnosis not present

## 2020-12-26 DIAGNOSIS — Z170 Estrogen receptor positive status [ER+]: Secondary | ICD-10-CM | POA: Diagnosis not present

## 2020-12-27 ENCOUNTER — Encounter (HOSPITAL_COMMUNITY): Payer: Self-pay

## 2020-12-27 ENCOUNTER — Encounter: Payer: Self-pay | Admitting: *Deleted

## 2020-12-27 ENCOUNTER — Telehealth: Payer: Self-pay | Admitting: *Deleted

## 2020-12-27 NOTE — Telephone Encounter (Signed)
Received oncotype results of 8/3%. Left message on identified voicemail for a return call

## 2020-12-29 ENCOUNTER — Other Ambulatory Visit (HOSPITAL_COMMUNITY): Payer: Self-pay

## 2020-12-29 MED ORDER — OXYCODONE HCL 5 MG PO TABS
ORAL_TABLET | ORAL | 0 refills | Status: DC
  Filled 2020-12-29: qty 15, 3d supply, fill #0

## 2021-01-03 ENCOUNTER — Other Ambulatory Visit (HOSPITAL_COMMUNITY): Payer: Self-pay

## 2021-01-04 DIAGNOSIS — C50911 Malignant neoplasm of unspecified site of right female breast: Secondary | ICD-10-CM | POA: Diagnosis not present

## 2021-01-05 ENCOUNTER — Encounter: Payer: Self-pay | Admitting: Physical Therapy

## 2021-01-05 ENCOUNTER — Other Ambulatory Visit: Payer: Self-pay | Admitting: *Deleted

## 2021-01-05 DIAGNOSIS — C50411 Malignant neoplasm of upper-outer quadrant of right female breast: Secondary | ICD-10-CM

## 2021-01-05 DIAGNOSIS — Z170 Estrogen receptor positive status [ER+]: Secondary | ICD-10-CM

## 2021-01-07 NOTE — Progress Notes (Addendum)
[place]  [phone] [phone]     ID: Donna Robbins DOB: 03/23/1971  MR#: [id number]  YQI#:[id number]  Patient Care Team: Hoyt Koch, MD as PCP - General (Internal Medicine) Servando Salina, MD as Consulting Physician (Obstetrics and Gynecology) Thurman Coyer, DO as Consulting Physician (Family Medicine) Gatha Mayer, MD (Gastroenterology) Coralie Keens, MD as Consulting Physician (General Surgery) Antwann Preziosi, Virgie Dad, MD as Consulting Physician (Oncology) Gery Pray, MD as Consulting Physician (Radiation Oncology) Rockwell Germany, RN as Oncology Nurse Navigator Mauro Kaufmann, RN as Oncology Nurse Navigator Gregor Hams, MD as Consulting Physician (Family Medicine) Rozetta Nunnery, MD as Consulting Physician (Otolaryngology) Chauncey Cruel, MD OTHER MD:  CHIEF COMPLAINT: Estrogen receptor positive breast cancer (s/p right mastectomy)  CURRENT TREATMENT: To start anastrozole in 01/23/2021   INTERVAL HISTORY: Donna Robbins returns today for follow up of her estrogen receptor positive breast cancer. She was evaluated in the multidisciplinary breast cancer clinic on 11/15/2020.   She underwent right mastectomy on 12/13/2020 under Dr. Ninfa Linden. Pathology from the procedure [id number]) showed: invasive and in situ ductal carcinoma, 0.9 cm, grade 1; margins not involved.  All 5 biopsied lymph nodes were negative for carcinoma (0/5).  Oncotype DX was obtained on the final surgical sample and the recurrence score of 8 predicts a risk of recurrence outside the breast over the next 9 years of 3%, if the patient's only systemic therapy is an antiestrogen for 5 years.  It also predicts no benefit from chemotherapy.    REVIEW OF SYSTEMS: Donna Robbins is recovering well from surgery.  She just had her drain removed last week.  That area is still has a little bit of leakage and the skin around it is very irritated because of  the adhesives she is using to keep the 4 x 4 in place.  She has significant discomfort in the right axilla area, less so over the breast.  She wished the incision were flatter.  She is very concerned about lymphedema but so far as she has not developed any that she is aware of.  There has been no fever or bleeding.  A detailed review of systems was otherwise stable.   COVID 19 VACCINATION STATUS: [place] x3, most recently 02/2020   HISTORY OF CURRENT ILLNESS: From the original intake note:  Donna Robbins had routine screening mammography on 10/24/2020 showing a possible abnormality in the right breast. She underwent right diagnostic mammography with tomography and right breast ultrasonography at [facility] on 11/06/2020 showing: breast density category B; 0.8 cm irregular mass in right breast at 9 o'clock; no right axillary lymphadenopathy.  Accordingly on 11/08/2020 she proceeded to biopsy of the right breast area in question. The pathology from this procedure ([id number]) showed: invasive ductal carcinoma, grade 1/2; ductal carcinoma in situ. Prognostic indicators significant for: estrogen receptor, 100% positive and progesterone receptor, 100% positive, both with strong staining intensity. Proliferation marker Ki67 at 1%. HER2 negative by immunohistochemistry (0).  Cancer Staging Malignant neoplasm of upper-outer quadrant of right breast in female, estrogen receptor positive ([place]) Staging form: Breast, AJCC 8th Edition - Clinical stage from 11/15/2020: Stage IA (cT1b, cN0, cM0, G1, ER+, PR+, HER2-) - Signed by Chauncey Cruel, MD on 11/15/2020 Stage prefix: Initial diagnosis Histologic grading system: 3 grade system  The patient's subsequent history is as detailed below.   PAST MEDICAL HISTORY: Past Medical History:  Diagnosis Date   Allergy    Anxiety    Breast cancer (  [place])    Depression    Hx of adenomatous polyp of colon 07/18/2016   Irritable bowel syndrome    OBESITY     PAST  SURGICAL HISTORY: Past Surgical History:  Procedure Laterality Date   CESAREAN SECTION     x's 2   CHOLECYSTECTOMY  2009   ENT surgery  2004   MASTECTOMY W/ SENTINEL NODE BIOPSY Right 12/13/2020   Procedure: RIGHT MASTECTOMY WITH SENTINEL LYMPH NODE BIOPSY;  Surgeon: Coralie Keens, MD;  Location: [place];  Service: General;  Laterality: Right;    FAMILY HISTORY: Family History  Problem Relation Age of Onset   Diabetes Mother    Hyperlipidemia Mother    Cancer Father        Angio sarcoma   Hyperlipidemia Father    Arthritis Maternal Grandmother    Lung cancer Maternal Grandmother    Lung cancer Maternal Grandfather    Lung cancer Paternal Grandmother    Arthritis Other    Colon cancer Neg Hx    Stomach cancer Neg Hx    Rectal cancer Neg Hx    Esophageal cancer Neg Hx    Liver cancer Neg Hx    Her father died at age 71 of angiosarcoma. This was secondary to Agent Orange exposure and was diagnosed at age 22. Her mother is 15 years old, as of 10/2020. She has a history of melanoma of the eye, diagnosed at age 4. Donna Robbins has two brothers (and no sisters). In addition to her parents, she reports lung cancer in both maternal grandparents and a maternal aunt (all were smokers).   GYNECOLOGIC HISTORY:  No LMP recorded. Menarche: 50 years old Age at first live birth: 50 years old Ama P 2 LMP 11/09/2020 Contraceptive: used from age 18 to 58 HRT n/a  Hysterectomy? no BSO? 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 Husband Remo Lipps is an English as a second language teacher in estate planning. She lives at home with Remo Lipps and their two children, Diona Fanti, age 52, and 23, age 62. She is not a Designer, fashion/clothing.    ADVANCED DIRECTIVES: in place   HEALTH MAINTENANCE: Social History   Tobacco Use   Smoking status: Never   Smokeless tobacco: Never  Substance Use Topics   Alcohol use: Yes    Alcohol/week: 2.0 standard  drinks    Types: 2 Glasses of wine per week    Comment: Rarely   Drug use: Yes    Types: Marijuana    Comment: once a month     Colonoscopy: 06/2016 (Dr. Carlean Purl), recall 2023  PAP: fall 2021  Bone density: never done   Allergies  Allergen Reactions   Iodine Itching    REACTION: Itching   Sulfonamide Derivatives Itching and Rash    REACTION: Itching Rash    Current Outpatient Medications  Medication Sig Dispense Refill   anastrozole (ARIMIDEX) 1 MG tablet Take 1 tablet (1 mg total) by mouth daily. To start Jan 23, 2021 90 tablet 4   acetaminophen (TYLENOL) 500 MG tablet Take 1,000 mg by mouth 2 (two) times daily as needed (joint/muscle pain.).     cephALEXin (KEFLEX) 500 MG capsule Take 1 capsule by mouth 3 times daily. (Patient not taking: Reported on 11/30/2020) 15 capsule 0   cholecalciferol (VITAMIN D) 1000 units tablet Take 1,000 Units by mouth in the morning.     clindamycin (CLEOCIN T) 1 % external solution Apply 1 mL on the skin  twice a day (Patient not taking: Reported on 11/30/2020) 60 mL 6   clindamycin (CLINDAGEL) 1 % gel APPLY 1 APPLICATION ON THE SKIN TWICE A DAY (Patient taking differently: Apply 1 application topically 2 (two) times daily as needed (acne).) 60 g 2   clonazePAM (KLONOPIN) 0.5 MG tablet TAKE 1/2 TO 1 TABLET BY MOUTH DAILY AS NEEDED FOR ANXIETY (Patient taking differently: Take 0.25 mg by mouth daily as needed for anxiety.) 30 tablet 2   Multiple Vitamin (MULTIVITAMIN WITH MINERALS) TABS tablet Take 1 tablet by mouth in the morning.     nitroGLYCERIN (NITRODUR - DOSED IN MG/24 HR) 0.2 mg/hr patch APPLY 1/4 PATCH DAILY TO TENDON FOR TENDONITIS. 30 patch 1   oxyCODONE (OXY IR/ROXICODONE) 5 MG immediate release tablet Take 1 tablet by mouth every 6 hours as needed for moderate pain, severe pain or breakthrough pain. 25 tablet 0   oxyCODONE (OXY IR/ROXICODONE) 5 MG immediate release tablet Take 1 tablet (5 mg total) by mouth every 6 (six) hours as needed for pain  15 tablet 0   predniSONE (DELTASONE) 10 MG tablet Days 1 - 4 take 4 tablets daily--  Days 5 - 8 take 3 tablets daily-- Days 9 - 11 take 2 tablets daily-- Days 12 - 14 take 1 tablet daily 37 tablet 0   vitamin B-12 (CYANOCOBALAMIN) 1000 MCG tablet Take 1,000 mcg by mouth in the morning.     No current facility-administered medications for this visit.    OBJECTIVE: White woman who appears younger than stated age  84:   01/08/21 1547  BP: 119/78  Pulse: 62  Resp: 17  Temp: (!) 97.3 F (36.3 C)  SpO2: 99%      Body mass index is 32.4 kg/m.   Wt Readings from Last 3 Encounters:  01/08/21 182 lb 14.4 oz (83 kg)  12/13/20 190 lb (86.2 kg)  12/06/20 191 lb (86.6 kg)     ECOG FS:1 - Symptomatic but completely ambulatory  Sclerae unicteric, EOMs intact Wearing a mask No cervical or supraclavicular adenopathy Lungs no rales or rhonchi Heart regular rate and rhythm Abd soft, nontender, positive bowel sounds MSK no focal spinal tenderness, no upper extremity lymphedema Neuro: nonfocal, well oriented, appropriate affect Breasts: The right breast is status postmastectomy.  The incision is healing well, with no dehiscence erythema or swelling.  The drain exit site is imaged below.  There is no evidence of infection.  Right chest wall drain area 01/08/2021    LAB RESULTS:  CMP     Component Value Date/Time   NA 140 01/08/2021 1533   K 4.1 01/08/2021 1533   CL 104 01/08/2021 1533   CO2 26 01/08/2021 1533   GLUCOSE 86 01/08/2021 1533   BUN 7 01/08/2021 1533   CREATININE 0.67 01/08/2021 1533   CALCIUM 9.0 01/08/2021 1533   PROT 6.9 01/08/2021 1533   ALBUMIN 3.7 01/08/2021 1533   AST 17 01/08/2021 1533   ALT 15 01/08/2021 1533   ALKPHOS 52 01/08/2021 1533   BILITOT 0.3 01/08/2021 1533   GFRNONAA >60 01/08/2021 1533   GFRAA  05/23/2010 2050    >60        The eGFR has been calculated using the MDRD equation. This calculation has not been validated in all  clinical situations. eGFR's persistently <60 mL/min signify possible Chronic Kidney Disease.    No results found for: TOTALPROTELP, ALBUMINELP, A1GS, A2GS, BETS, BETA2SER, GAMS, MSPIKE, SPEI  Lab Results  Component Value Date  WBC 8.5 01/08/2021   NEUTROABS 4.9 01/08/2021   HGB 12.3 01/08/2021   HCT 37.4 01/08/2021   MCV 97.7 01/08/2021   PLT 289 01/08/2021    No results found for: LABCA2  No components found for: HLKTGY563  No results for input(s): INR in the last 168 hours.  No results found for: LABCA2  No results found for: SLH734  No results found for: KAJ681  No results found for: LXB262  No results found for: CA2729  No components found for: HGQUANT  No results found for: CEA1 / No results found for: CEA1   No results found for: AFPTUMOR  No results found for: CHROMOGRNA  No results found for: KPAFRELGTCHN, LAMBDASER, KAPLAMBRATIO (kappa/lambda light chains)  No results found for: HGBA, HGBA2QUANT, HGBFQUANT, HGBSQUAN (Hemoglobinopathy evaluation)   No results found for: LDH  No results found for: IRON, TIBC, IRONPCTSAT (Iron and TIBC)  Lab Results  Component Value Date   FERRITIN 39.8 08/01/2017    Urinalysis    Component Value Date/Time   COLORURINE YELLOW 08/13/2013 [place] 08/13/2013 1007   LABSPEC <=1.005 (A) 08/13/2013 1007   PHURINE 7.0 08/13/2013 1007   GLUCOSEU NEGATIVE 08/13/2013 1007   HGBUR NEGATIVE 08/13/2013 [place] 08/13/2013 1007   BILIRUBINUR neg 07/13/2011 [place] 08/13/2013 1007   PROTEINUR neg 07/13/2011 0939   PROTEINUR NEGATIVE 05/23/2010 2233   UROBILINOGEN 0.2 08/13/2013 1007   NITRITE NEGATIVE 08/13/2013 1007   LEUKOCYTESUR NEGATIVE 08/13/2013 1007    STUDIES: NM Sentinel Node Inj-No Rpt (Breast)  Result Date: 12/13/2020 Sulfur Colloid was injected by the Nuclear Medicine Technologist for sentinel lymph node localization.     ELIGIBLE FOR  AVAILABLE RESEARCH PROTOCOL: no  ASSESSMENT: 51 y.o. [place] woman status post right breast upper outer quadrant biopsy 11/08/2020 for a clinical T1b N0, stage IA invasive ductal carcinoma, low-to intermediate grade, estrogen and progesterone receptor positive, HER2 not amplified, with an MIB-1 of 1%  (1) right mastectomy on 12/13/2020 confirmed a pT1b pN0, stage IA invasive ductal carcinoma, grade 1, with negative margins. (a) a total of 5 right axillary lymph nodes were removed  (2) Oncotype score of 8 predicts a risk of recurrence outside the breast within the next 9 years of 3% if the patient's only systemic therapy is antiestrogens for 5 years.  It also predicts no significant benefit from chemotherapy.  (3) adjuvant radiation not indicated  (4) to start tamoxifen 01/23/2021   PLAN: Donna Robbins did well with her surgery.  She did just have her drain removed and she understands that the axilla in particular is an area that most patients find difficult.  There can be soreness sensitivity numbness and general discomfort in the axilla for a long time.  Hopefully though we will see some improvement over the next few weeks.    A second issue is that the incision is not totally flat.  This may make it a little difficult when she gets her prostheses.  If after a few months that is still a problem it may be possible to remove some additional tissue as needed.  She is hoping to lose some weight however and it is wise to wait for now.    She has excellent range of motion and will be seeing physical therapy 01/15/2021 at which time she will be measured for compression sleeve.  Today we discussed the difference between tamoxifen and anastrozole.   She understands that anastrozole and the aromatase inhibitors  in general work by blocking estrogen production. Accordingly vaginal dryness, decrease in bone density, and of course hot flashes can result. The aromatase inhibitors can also negatively affect the  cholesterol profile, although that is a minor effect. One out of 5 women on aromatase inhibitors we will feel "old and achy". This arthralgia/myalgia syndrome, which resembles fibromyalgia clinically, does resolve with stopping the medications. Accordingly this is not a reason to not try an aromatase inhibitor but it is a frequent reason to stop it (in other words 20% of women will not be able to tolerate these medications).  Tamoxifen on the other hand does not block estrogen production. It does not "take away a woman's estrogen". It blocks the estrogen receptor in breast cells. Like anastrozole, it can also cause hot flashes. As opposed to anastrozole, tamoxifen has many estrogen-like effects. It is technically an estrogen receptor modulator. This means that in some tissues tamoxifen works like estrogen-- for example it helps strengthen the bones. It tends to improve the cholesterol profile. It can cause thickening of the endometrial lining, and even endometrial polyps or rarely cancer of the uterus.(The risk of uterine cancer due to tamoxifen is one additional cancer per thousand women year). It can cause vaginal wetness or stickiness. It can cause blood clots through this estrogen-like effect--the risk of blood clots with tamoxifen is exactly the same as with birth control pills or hormone replacement.  Neither of these agents causes mood changes or weight gain, despite the popular belief that they can have these side effects. We have data from studies comparing either of these drugs with placebo, and in those cases the control group had the same amount of weight gain and depression as the group that took the drug.  Given her age I think tamoxifen is far the better choice and she will started 01/23/2021 by which time I think she will be back to work and a little bit more settled into her normal routine.  We will then have a virtual visit in December to make sure she is tolerating it well.  Total  encounter time 35 minutes.Sarajane Jews C. Hans Rusher, MD 01/08/2021 5:06 PM Medical Oncology and Hematology [facility] [place], [place] [address] Tel. [phone]    Fax. [phone]   This document serves as a record of services personally performed by Lurline Del, MD. It was created on his behalf by Wilburn Mylar, a trained medical scribe. The creation of this record is based on the scribe's personal observations and the provider's statements to them.   I, Lurline Del MD, have reviewed the above documentation for accuracy and completeness, and I agree with the above.   *Total Encounter Time as defined by the Centers for Medicare and Medicaid Services includes, in addition to the face-to-face time of a patient visit (documented in the note above) non-face-to-face time: obtaining and reviewing outside history, ordering and reviewing medications, tests or procedures, care coordination (communications with other health care professionals or caregivers) and documentation in the medical record.

## 2021-01-08 ENCOUNTER — Inpatient Hospital Stay (HOSPITAL_BASED_OUTPATIENT_CLINIC_OR_DEPARTMENT_OTHER): Payer: 59 | Admitting: Oncology

## 2021-01-08 ENCOUNTER — Other Ambulatory Visit (HOSPITAL_COMMUNITY): Payer: Self-pay

## 2021-01-08 ENCOUNTER — Inpatient Hospital Stay: Payer: 59 | Attending: Oncology

## 2021-01-08 ENCOUNTER — Other Ambulatory Visit: Payer: Self-pay

## 2021-01-08 VITALS — BP 119/78 | HR 62 | Temp 97.3°F | Resp 17 | Wt 182.9 lb

## 2021-01-08 DIAGNOSIS — Z170 Estrogen receptor positive status [ER+]: Secondary | ICD-10-CM

## 2021-01-08 DIAGNOSIS — Z9011 Acquired absence of right breast and nipple: Secondary | ICD-10-CM | POA: Insufficient documentation

## 2021-01-08 DIAGNOSIS — C50411 Malignant neoplasm of upper-outer quadrant of right female breast: Secondary | ICD-10-CM | POA: Diagnosis not present

## 2021-01-08 DIAGNOSIS — C50911 Malignant neoplasm of unspecified site of right female breast: Secondary | ICD-10-CM | POA: Insufficient documentation

## 2021-01-08 DIAGNOSIS — Z79811 Long term (current) use of aromatase inhibitors: Secondary | ICD-10-CM | POA: Diagnosis not present

## 2021-01-08 LAB — CMP (CANCER CENTER ONLY)
ALT: 15 U/L (ref 0–44)
AST: 17 U/L (ref 15–41)
Albumin: 3.7 g/dL (ref 3.5–5.0)
Alkaline Phosphatase: 52 U/L (ref 38–126)
Anion gap: 10 (ref 5–15)
BUN: 7 mg/dL (ref 6–20)
CO2: 26 mmol/L (ref 22–32)
Calcium: 9 mg/dL (ref 8.9–10.3)
Chloride: 104 mmol/L (ref 98–111)
Creatinine: 0.67 mg/dL (ref 0.44–1.00)
GFR, Estimated: >60 mL/min (ref >60)
Glucose, Bld: 86 mg/dL (ref 70–99)
Potassium: 4.1 mmol/L (ref 3.5–5.1)
Sodium: 140 mmol/L (ref 135–145)
Total Bilirubin: 0.3 mg/dL (ref 0.3–1.2)
Total Protein: 6.9 g/dL (ref 6.5–8.1)

## 2021-01-08 LAB — CBC WITH DIFFERENTIAL (CANCER CENTER ONLY)
Abs Immature Granulocytes: 0.02 10*3/uL (ref 0.00–0.07)
Basophils Absolute: 0.1 10*3/uL (ref 0.0–0.1)
Basophils Relative: 1 %
Eosinophils Absolute: 0.7 10*3/uL — ABNORMAL HIGH (ref 0.0–0.5)
Eosinophils Relative: 9 %
HCT: 37.4 % (ref 36.0–46.0)
Hemoglobin: 12.3 g/dL (ref 12.0–15.0)
Immature Granulocytes: 0 %
Lymphocytes Relative: 25 %
Lymphs Abs: 2.1 10*3/uL (ref 0.7–4.0)
MCH: 32.1 pg (ref 26.0–34.0)
MCHC: 32.9 g/dL (ref 30.0–36.0)
MCV: 97.7 fL (ref 80.0–100.0)
Monocytes Absolute: 0.7 10*3/uL (ref 0.1–1.0)
Monocytes Relative: 8 %
Neutro Abs: 4.9 10*3/uL (ref 1.7–7.7)
Neutrophils Relative %: 57 %
Platelet Count: 289 10*3/uL (ref 150–400)
RBC: 3.83 MIL/uL — ABNORMAL LOW (ref 3.87–5.11)
RDW: 13.4 % (ref 11.5–15.5)
WBC Count: 8.5 10*3/uL (ref 4.0–10.5)
nRBC: 0 % (ref 0.0–0.2)

## 2021-01-08 MED ORDER — ANASTROZOLE 1 MG PO TABS
1.0000 mg | ORAL_TABLET | Freq: Every day | ORAL | 4 refills | Status: DC
  Filled 2021-01-08: qty 90, 90d supply, fill #0

## 2021-01-09 ENCOUNTER — Encounter: Payer: Self-pay | Admitting: *Deleted

## 2021-01-09 DIAGNOSIS — C50411 Malignant neoplasm of upper-outer quadrant of right female breast: Secondary | ICD-10-CM

## 2021-01-09 DIAGNOSIS — Z170 Estrogen receptor positive status [ER+]: Secondary | ICD-10-CM

## 2021-01-10 ENCOUNTER — Other Ambulatory Visit: Payer: Self-pay | Admitting: Oncology

## 2021-01-10 ENCOUNTER — Other Ambulatory Visit (HOSPITAL_COMMUNITY): Payer: Self-pay

## 2021-01-10 ENCOUNTER — Encounter: Payer: Self-pay | Admitting: Oncology

## 2021-01-10 DIAGNOSIS — C50911 Malignant neoplasm of unspecified site of right female breast: Secondary | ICD-10-CM | POA: Diagnosis not present

## 2021-01-10 MED ORDER — TAMOXIFEN CITRATE 20 MG PO TABS
20.0000 mg | ORAL_TABLET | Freq: Every day | ORAL | 4 refills | Status: AC
  Filled 2021-01-10: qty 90, 90d supply, fill #0

## 2021-01-10 NOTE — Progress Notes (Signed)
I had entered anastrozole instead of tamoxifen as the order.  Jan picked up on that let us know and I have corrected that issue.

## 2021-01-12 ENCOUNTER — Encounter: Payer: Self-pay | Admitting: Oncology

## 2021-01-15 ENCOUNTER — Other Ambulatory Visit: Payer: Self-pay

## 2021-01-15 ENCOUNTER — Ambulatory Visit: Payer: 59 | Attending: Orthopaedic Surgery | Admitting: Physical Therapy

## 2021-01-15 ENCOUNTER — Encounter: Payer: Self-pay | Admitting: Physical Therapy

## 2021-01-15 DIAGNOSIS — R293 Abnormal posture: Secondary | ICD-10-CM | POA: Diagnosis not present

## 2021-01-15 DIAGNOSIS — M25611 Stiffness of right shoulder, not elsewhere classified: Secondary | ICD-10-CM | POA: Diagnosis not present

## 2021-01-15 DIAGNOSIS — C50411 Malignant neoplasm of upper-outer quadrant of right female breast: Secondary | ICD-10-CM | POA: Diagnosis not present

## 2021-01-15 DIAGNOSIS — Z483 Aftercare following surgery for neoplasm: Secondary | ICD-10-CM | POA: Diagnosis not present

## 2021-01-15 DIAGNOSIS — R600 Localized edema: Secondary | ICD-10-CM | POA: Insufficient documentation

## 2021-01-15 DIAGNOSIS — Z170 Estrogen receptor positive status [ER+]: Secondary | ICD-10-CM | POA: Diagnosis not present

## 2021-01-15 NOTE — Patient Instructions (Addendum)
     [facility]  [address], [address]  [place] [address]  [phone]  After Breast Cancer Class It is recommended you attend the ABC class to be educated on lymphedema risk reduction. This class is free of charge and lasts for 1 hour. It is a 1-time class. You are scheduled for Nov. 7 at 11:00.  Scar massage You can begin gentle scar massage a few minutes each day followed by lightly rubbing in coconut oil.  Compression garment You can go to a [facility] to get a class I compression sleeve and gauntlet for flying.  Home exercise Program Keep doing your exercises and add the 2 below.  Follow up PT: It is recommended you return every 3 months for the first 3 years following surgery to be assessed on the SOZO machine for an L-Dex score. This helps prevent clinically significant lymphedema in 95% of patients. These follow up screens are 10 minute appointments that you are not billed for. You are scheduled for December 27th at 10:00.  Closed Chain: Shoulder Flexion / Extension - on Wall    Hands on wall, step backward. Return. Stepping causes shoulder flexion and extension Do __5_ times, holding 5 seconds, _2__ times per day.  http://ss.exer.us/265   Copyright  VHI. All rights reserved.   Closed Chain: Shoulder Abduction / Adduction - on Wall    One hand on wall, step to side and return. Stepping causes shoulder to abduct and adduct. Do __5_ times, holding 5 seconds, _2__ times per day.  http://ss.exer.us/267   Copyright  VHI. All rights reserved.

## 2021-01-15 NOTE — Therapy (Signed)
[facility] @ [place], [place], [address] Phone: [phone]   Fax:  [phone]  Physical Therapy Treatment  Patient Details  Name: Donna Robbins MRN: [id number] Date of Birth: [birth year] Referring Provider (PT): Dr. Coralie Keens   Encounter Date: 01/15/2021   PT End of Session - 01/15/21 1148     Visit Number 2    Number of Visits 10    Date for PT Re-Evaluation 02/12/21    PT Start Time 5364    PT Stop Time 1150    PT Time Calculation (min) 48 min    Activity Tolerance Patient tolerated treatment well    Behavior During Therapy [facility] for tasks assessed/performed             Past Medical History:  Diagnosis Date   Allergy    Anxiety    Breast cancer ([place])    Depression    Hx of adenomatous polyp of colon 07/18/2016   Irritable bowel syndrome    OBESITY     Past Surgical History:  Procedure Laterality Date   CESAREAN SECTION     x's 2   CHOLECYSTECTOMY  2009   ENT surgery  2004   MASTECTOMY W/ SENTINEL NODE BIOPSY Right 12/13/2020   Procedure: RIGHT MASTECTOMY WITH SENTINEL LYMPH NODE BIOPSY;  Surgeon: Coralie Keens, MD;  Location: [facility];  Service: General;  Laterality: Right;    There were no vitals filed for this visit.   Subjective Assessment - 01/15/21 1107     Subjective Patient reports she underwent a right mastectomy and sentinel node bipsy (5 negative nodes) on 12/13/2020. Her Oncotype was low and she does not need radiation. She will begin Tamoxifen on 01/23/2021.    Pertinent History Patient was diagnosed on 10/24/2020 with right grade I invasive ductal carcinoma breast cancer. She underwent a right mastectomy and sentinel node bipsy (5 negative nodes) on 9/21/2022It is ER/PR positive and HER2 negative with a Ki67 of 1%.    Patient Stated Goals See if we can improve my swelling and scar tissue    Currently in Pain? Yes    Pain Location Arm    Pain Orientation  Right;Upper;Medial    Pain Descriptors / Indicators Numbness;Burning    Pain Type Surgical pain    Pain Onset 1 to 4 weeks ago    Pain Frequency Intermittent    Aggravating Factors  Using arms; wearing bra    Pain Relieving Factors Unknown                [facility] PT Assessment - 01/15/21 0001       Assessment   Medical Diagnosis s/p right mastectomy and SLNB    Referring Provider (PT) Dr. Coralie Keens    Onset Date/Surgical Date 12/13/20    Hand Dominance Right    Prior Therapy Baselines      Precautions   Precautions Other (comment)    Precaution Comments right arm lymphedema risk      Restrictions   Weight Bearing Restrictions No      Balance Screen   Has the patient fallen in the past 6 months No    Has the patient had a decrease in activity level because of a fear of falling?  No    Is the patient reluctant to leave their home because of a fear of falling?  No      Home Ecologist residence  Living Arrangements Spouse/significant other;Children   Husband, 46 and 44 y.o. sons   Available Help at Discharge Family      Prior Function   Level of Independence Independent    Vocation Full time employment    Games developer for [employer]    Leisure She is walking daily 30 minutes      Cognition   Overall Cognitive Status Within Functional Limits for tasks assessed      Observation/Other Assessments   Observations Incision appears to be healing well. She has excess tissue present around her breast incision. She has moderate right upper quadrant and upper back edema present..      Posture/Postural Control   Posture/Postural Control Postural limitations    Postural Limitations Rounded Shoulders;Forward head      ROM / Strength   AROM / PROM / Strength AROM      AROM   AROM Assessment Site Shoulder    Right/Left Shoulder Right    Right Shoulder Extension 52 Degrees    Right  Shoulder Flexion 133 Degrees    Right Shoulder ABduction 143 Degrees    Right Shoulder Internal Rotation 59 Degrees    Right Shoulder External Rotation 89 Degrees      Strength   Overall Strength Within functional limits for tasks performed               LYMPHEDEMA/ONCOLOGY QUESTIONNAIRE - 01/15/21 0001       Type   Cancer Type Right breast cancer      Surgeries   Mastectomy Date 12/13/20    Sentinel Lymph Node Biopsy Date 12/13/20    Number Lymph Nodes Removed 5      Treatment   Active Chemotherapy Treatment No    Past Chemotherapy Treatment No    Active Radiation Treatment No    Past Radiation Treatment No    Current Hormone Treatment No    Past Hormone Therapy No      What other symptoms do you have   Are you Having Heaviness or Tightness Yes    Are you having Pain Yes    Are you having pitting edema No    Is it Hard or Difficult finding clothes that fit No    Do you have infections No    Is there Decreased scar mobility Yes    Stemmer Sign No      Lymphedema Assessments   Lymphedema Assessments Upper extremities      Right Upper Extremity Lymphedema   10 cm Proximal to Olecranon Process 31.8 cm    Olecranon Process 25.1 cm    10 cm Proximal to Ulnar Styloid Process 23.7 cm    Just Proximal to Ulnar Styloid Process 16 cm    Across Hand at [employer] 19.2 cm    At [place] of 2nd Digit 6 cm      Left Upper Extremity Lymphedema   10 cm Proximal to Olecranon Process 32.2 cm    Olecranon Process 24.6 cm    10 cm Proximal to Ulnar Styloid Process 23 cm    Just Proximal to Ulnar Styloid Process 15.5 cm    Across Hand at [employer] 18.5 cm    At [place] of 2nd Digit 5.9 cm                Quick Dash - 01/15/21 0001     Open a tight or new jar Mild difficulty    Do heavy household  chores (wash walls, wash floors) Mild difficulty    Carry a shopping bag or briefcase Mild difficulty    Wash your back No difficulty    Use a knife to cut food No  difficulty    Recreational activities in which you take some force or impact through your arm, shoulder, or hand (golf, hammering, tennis) Mild difficulty    During the past week, to what extent has your arm, shoulder or hand problem interfered with your normal social activities with family, friends, neighbors, or groups? Slightly    During the past week, to what extent has your arm, shoulder or hand problem limited your work or other regular daily activities Slightly    Arm, shoulder, or hand pain. Moderate    Tingling (pins and needles) in your arm, shoulder, or hand Moderate    Difficulty Sleeping Mild difficulty    DASH Score 25 %                             PT Education - 01/15/21 1148     Education Details Aftercare; scar massage; lymphedema education    Person(s) Educated Patient    Methods Explanation;Handout    Comprehension Returned demonstration;Verbalized understanding                 PT Long Term Goals - 01/15/21 1527       PT LONG TERM GOAL #1   Title Patient will demonstrate she has regained full shoulder ROM and function post operatively compared ot baselines.    Time 4    Period Weeks    Status New    Target Date 02/12/21      PT LONG TERM GOAL #2   Title Patient will increase right shoulder flexion AROM to be >/= 160 degrees for increased ease reaching.    Baseline 133 post op; 163 pre-op    Time 4    Period Weeks    Status New    Target Date 02/12/21      PT LONG TERM GOAL #3   Title Patient will increase right shoulder active abduction to >/= 160 degrees for increased ease reaching and tolerating daily tasks.    Baseline 143 post op; 167 pre-op4    Time 4    Period Weeks    Status New    Target Date 02/12/21      PT LONG TERM GOAL #4   Title Patient will improve her DASH score to be </= 16 for improved overall UE function.    Baseline 15.91 pre op and 25 post op    Time 4    Period Weeks    Status New    Target Date  02/12/21      PT LONG TERM GOAL #5   Title Patient will report >/= 25% improvement in left upper quadrant swelling.    Time 4    Period Weeks    Status New    Target Date 02/12/21                   Plan - 01/15/21 1521     Clinical Impression Statement Patient is doing well s/p right mastectomy and sentinel node biopsy on 12/13/2020. She had a low Oncotype score so no chemo is needed but she will begin Tamoxifen on 01/23/2021. No radiation needed as she had a mastectomy and nodes were clear. She will benefit from PT to regain full shoulder ROM,  address scar tissue, and reduce swelling.    PT Frequency 2x / week    PT Duration 4 weeks    PT Treatment/Interventions ADLs/Self Care Home Management;Therapeutic exercise;Patient/family education;Manual techniques;Manual lymph drainage;Passive range of motion;Scar mobilization    PT Next Visit Plan PROM right shoulder; manual lymph drainage for right upper quadrant focused on upper and lateral back    PT Home Exercise Plan Post op shoulder HEP and closed chain flexion and abduction    Consulted and Agree with Plan of Care Patient             Patient will benefit from skilled therapeutic intervention in order to improve the following deficits and impairments:  Postural dysfunction, Decreased range of motion, Decreased knowledge of precautions, Impaired UE functional use, Pain, Increased edema, Decreased scar mobility  Visit Diagnosis: Malignant neoplasm of upper-outer quadrant of right breast in female, estrogen receptor positive ([place]) - Plan: PT plan of care cert/re-cert  Abnormal posture - Plan: PT plan of care cert/re-cert  Aftercare following surgery for neoplasm - Plan: PT plan of care cert/re-cert  Stiffness of right shoulder, not elsewhere classified - Plan: PT plan of care cert/re-cert  Localized edema - Plan: PT plan of care cert/re-cert     Problem List Patient Active Problem List   Diagnosis Date Noted   S/P  mastectomy, right 12/13/2020   Malignant neoplasm of upper-outer quadrant of right breast in female, estrogen receptor positive ([place]) 11/13/2020   Chronic pain of left knee 04/15/2017   Hx of adenomatous polyp of colon 07/18/2016   Ventral hernia 11/15/2015   Routine general medical examination at a health care facility 11/24/2014   Anxiety    HEARING LOSS 03/14/2010   Obesity 03/06/2010   IRRITABLE BOWEL SYNDROME 03/06/2010   Annia Friendly, PT 01/15/21 3:31 PM   [facility] @ [place], [place], [address] Phone: [phone]   Fax:  [phone]  Name: Donna Robbins MRN: [id number] Date of Birth: 1970-05-05

## 2021-01-16 ENCOUNTER — Ambulatory Visit: Payer: 59 | Admitting: Rehabilitation

## 2021-01-16 ENCOUNTER — Encounter: Payer: Self-pay | Admitting: Rehabilitation

## 2021-01-16 DIAGNOSIS — C50411 Malignant neoplasm of upper-outer quadrant of right female breast: Secondary | ICD-10-CM

## 2021-01-16 DIAGNOSIS — R600 Localized edema: Secondary | ICD-10-CM

## 2021-01-16 DIAGNOSIS — R293 Abnormal posture: Secondary | ICD-10-CM

## 2021-01-16 DIAGNOSIS — Z483 Aftercare following surgery for neoplasm: Secondary | ICD-10-CM

## 2021-01-16 DIAGNOSIS — Z170 Estrogen receptor positive status [ER+]: Secondary | ICD-10-CM

## 2021-01-16 DIAGNOSIS — M25611 Stiffness of right shoulder, not elsewhere classified: Secondary | ICD-10-CM | POA: Diagnosis not present

## 2021-01-16 NOTE — Therapy (Signed)
[facility] @ [place], [place], [address] Phone: [phone]   Fax:  [phone]  Physical Therapy Treatment  Patient Details  Name: Donna Robbins MRN: [id number] Date of Birth: 13-Aug-1970 Referring Provider (PT): Dr. Coralie Keens   Encounter Date: 01/16/2021   PT End of Session - 01/16/21 0953     Visit Number 3    Number of Visits 10    Date for PT Re-Evaluation 02/12/21    PT Start Time 0805    PT Stop Time 0900    PT Time Calculation (min) 55 min    Activity Tolerance Patient tolerated treatment well    Behavior During Therapy [facility] for tasks assessed/performed             Past Medical History:  Diagnosis Date   Allergy    Anxiety    Breast cancer ([place])    Depression    Hx of adenomatous polyp of colon 07/18/2016   Irritable bowel syndrome    OBESITY     Past Surgical History:  Procedure Laterality Date   CESAREAN SECTION     x's 2   CHOLECYSTECTOMY  2009   ENT surgery  2004   MASTECTOMY W/ SENTINEL NODE BIOPSY Right 12/13/2020   Procedure: RIGHT MASTECTOMY WITH SENTINEL LYMPH NODE BIOPSY;  Surgeon: Coralie Keens, MD;  Location: [facility];  Service: General;  Laterality: Right;    There were no vitals filed for this visit.   Subjective Assessment - 01/16/21 0806     Subjective Finger 4 and 5 are going numb starting today.  Yesterday was the first day of work.  I now am starting to have some some back pain.  It got worse by the end of the day when I started swelling more.  I was using a camisole.    Pertinent History Patient was diagnosed on 10/24/2020 with right grade I invasive ductal carcinoma breast cancer. She underwent a right mastectomy and sentinel node bipsy (5 negative nodes) on 9/21/2022It is ER/PR positive and HER2 negative with a Ki67 of 1%.    Currently in Pain? No/denies   i took 4 ibuprofen                              [facility] Adult PT  Treatment/Exercise - 01/16/21 0001       Manual Therapy   Manual Therapy Edema management;Passive ROM;Manual Lymphatic Drainage (MLD)    Edema Management reviewed bras and gave pt 2 larger foam rectangles for use in bra for more flat compression. Discussed camis and tshirts and pt took a picture of jovi pak mastectomy pad    Manual Lymphatic Drainage (MLD) Started to the Rt upper quadrant: short neck, bil axillary nodes, Rt inguinal nodes, sternal nodes, interaxillary pathway and axillo inguinal pathway and then Rt chest towards pathways and then in Lt sidelying posterior interaxillary work.  Gave pt handout with only vcs and demo from today's session    Passive ROM to the Rt shoulder into flexion and abduction and ER                     PT Education - 01/16/21 0952     Education Details MLD education,    Person(s) Educated Patient    Methods Explanation;Demonstration;Handout    Comprehension Verbalized understanding  PT Long Term Goals - 01/15/21 1527       PT LONG TERM GOAL #1   Title Patient will demonstrate she has regained full shoulder ROM and function post operatively compared ot baselines.    Time 4    Period Weeks    Status New    Target Date 02/12/21      PT LONG TERM GOAL #2   Title Patient will increase right shoulder flexion AROM to be >/= 160 degrees for increased ease reaching.    Baseline 133 post op; 163 pre-op    Time 4    Period Weeks    Status New    Target Date 02/12/21      PT LONG TERM GOAL #3   Title Patient will increase right shoulder active abduction to >/= 160 degrees for increased ease reaching and tolerating daily tasks.    Baseline 143 post op; 167 pre-op4    Time 4    Period Weeks    Status New    Target Date 02/12/21      PT LONG TERM GOAL #4   Title Patient will improve her DASH score to be </= 16 for improved overall UE function.    Baseline 15.91 pre op and 25 post op    Time 4    Period Weeks     Status New    Target Date 02/12/21      PT LONG TERM GOAL #5   Title Patient will report >/= 25% improvement in left upper quadrant swelling.    Time 4    Period Weeks    Status New    Target Date 02/12/21                   Plan - 01/16/21 0953     Clinical Impression Statement Had increased pain and swelling after return to work yesterday.  Has general soft edema Rt upper quadrant and axilla and some numbness and tingling in the ulnar nerve distribution.  Tolerated tx well.    PT Frequency 2x / week    PT Duration 4 weeks    PT Treatment/Interventions ADLs/Self Care Home Management;Therapeutic exercise;Patient/family education;Manual techniques;Manual lymph drainage;Passive range of motion;Scar mobilization    PT Next Visit Plan how was foam? try MLD? MLD and self MLD for right upper quadrant focused on upper and lateral back    Consulted and Agree with Plan of Care Patient             Patient will benefit from skilled therapeutic intervention in order to improve the following deficits and impairments:     Visit Diagnosis: Malignant neoplasm of upper-outer quadrant of right breast in female, estrogen receptor positive ([place])  Abnormal posture  Aftercare following surgery for neoplasm  Localized edema     Problem List Patient Active Problem List   Diagnosis Date Noted   S/P mastectomy, right 12/13/2020   Malignant neoplasm of upper-outer quadrant of right breast in female, estrogen receptor positive ([place]) 11/13/2020   Chronic pain of left knee 04/15/2017   Hx of adenomatous polyp of colon 07/18/2016   Ventral hernia 11/15/2015   Routine general medical examination at a health care facility 11/24/2014   Anxiety    HEARING LOSS 03/14/2010   Obesity 03/06/2010   IRRITABLE BOWEL SYNDROME 03/06/2010    Stark Bray, PT 01/16/2021, 9:55 AM  [facility] @ [place], [place],  [address] Phone: [phone]  Fax:  [phone]  Name: Donna Robbins MRN: [id number] Date of Birth: 1970/04/15

## 2021-01-18 DIAGNOSIS — Z01411 Encounter for gynecological examination (general) (routine) with abnormal findings: Secondary | ICD-10-CM | POA: Diagnosis not present

## 2021-01-18 DIAGNOSIS — Z6833 Body mass index (BMI) 33.0-33.9, adult: Secondary | ICD-10-CM | POA: Diagnosis not present

## 2021-01-18 DIAGNOSIS — Z853 Personal history of malignant neoplasm of breast: Secondary | ICD-10-CM | POA: Diagnosis not present

## 2021-01-18 DIAGNOSIS — Z124 Encounter for screening for malignant neoplasm of cervix: Secondary | ICD-10-CM | POA: Diagnosis not present

## 2021-01-18 DIAGNOSIS — Z01419 Encounter for gynecological examination (general) (routine) without abnormal findings: Secondary | ICD-10-CM | POA: Diagnosis not present

## 2021-01-18 DIAGNOSIS — Z113 Encounter for screening for infections with a predominantly sexual mode of transmission: Secondary | ICD-10-CM | POA: Diagnosis not present

## 2021-01-19 ENCOUNTER — Other Ambulatory Visit (HOSPITAL_COMMUNITY): Payer: Self-pay

## 2021-01-19 MED ORDER — INFLUENZA VAC SPLIT QUAD 0.5 ML IM SUSY
PREFILLED_SYRINGE | INTRAMUSCULAR | 0 refills | Status: DC
  Filled 2021-01-19: qty 0.5, 1d supply, fill #0

## 2021-01-22 DIAGNOSIS — L821 Other seborrheic keratosis: Secondary | ICD-10-CM | POA: Diagnosis not present

## 2021-01-24 ENCOUNTER — Other Ambulatory Visit: Payer: Self-pay

## 2021-01-24 ENCOUNTER — Ambulatory Visit: Payer: 59 | Attending: Orthopaedic Surgery

## 2021-01-24 DIAGNOSIS — R293 Abnormal posture: Secondary | ICD-10-CM | POA: Insufficient documentation

## 2021-01-24 DIAGNOSIS — M25611 Stiffness of right shoulder, not elsewhere classified: Secondary | ICD-10-CM | POA: Diagnosis not present

## 2021-01-24 DIAGNOSIS — C50411 Malignant neoplasm of upper-outer quadrant of right female breast: Secondary | ICD-10-CM | POA: Diagnosis not present

## 2021-01-24 DIAGNOSIS — Z483 Aftercare following surgery for neoplasm: Secondary | ICD-10-CM | POA: Diagnosis not present

## 2021-01-24 DIAGNOSIS — R600 Localized edema: Secondary | ICD-10-CM | POA: Diagnosis not present

## 2021-01-24 DIAGNOSIS — Z170 Estrogen receptor positive status [ER+]: Secondary | ICD-10-CM | POA: Diagnosis not present

## 2021-01-24 DIAGNOSIS — M25521 Pain in right elbow: Secondary | ICD-10-CM | POA: Insufficient documentation

## 2021-01-24 DIAGNOSIS — M6281 Muscle weakness (generalized): Secondary | ICD-10-CM | POA: Diagnosis not present

## 2021-01-24 NOTE — Therapy (Signed)
[facility] @ [place], [place], [address] Phone: [phone]   Fax:  [phone]  Physical Therapy Treatment  Patient Details  Name: Donna Robbins MRN: [id number] Date of Birth: [birth year] Referring Provider (PT): Dr. Coralie Keens   Encounter Date: 01/24/2021   PT End of Session - 01/24/21 0907     Visit Number 4    Number of Visits 10    Date for PT Re-Evaluation 02/12/21    PT Start Time 0807    PT Stop Time 0909    PT Time Calculation (min) 62 min    Activity Tolerance Patient tolerated treatment well    Behavior During Therapy [facility] for tasks assessed/performed             Past Medical History:  Diagnosis Date   Allergy    Anxiety    Breast cancer ([place])    Depression    Hx of adenomatous polyp of colon 07/18/2016   Irritable bowel syndrome    OBESITY     Past Surgical History:  Procedure Laterality Date   CESAREAN SECTION     x's 2   CHOLECYSTECTOMY  2009   ENT surgery  2004   MASTECTOMY W/ SENTINEL NODE BIOPSY Right 12/13/2020   Procedure: RIGHT MASTECTOMY WITH SENTINEL LYMPH NODE BIOPSY;  Surgeon: Coralie Keens, MD;  Location: [facility];  Service: General;  Laterality: Right;    There were no vitals filed for this visit.   Subjective Assessment - 01/24/21 0809     Subjective Overall the Rt chest is getting better but it still tightens up so much with the computer and mouse use. I'm going to call [facility] for an appt to see about a compression cami. And the fluid at my Rt posterior chest is still there and bothering me. After my walk yesterday I could feel my back jiggling.    Pertinent History Patient was diagnosed on 10/24/2020 with right grade I invasive ductal carcinoma breast cancer. She underwent a right mastectomy and sentinel node bipsy (5 negative nodes) on 9/21/2022It is ER/PR positive and HER2 negative with a Ki67 of 1%.    Patient Stated Goals See if we can  improve my swelling and scar tissue    Currently in Pain? No/denies                               [facility] Adult PT Treatment/Exercise - 01/24/21 0001       Manual Therapy   Manual Lymphatic Drainage (MLD) Short neck, 5 diaphragmatic breaths, Lt axillary nodes, Rt inguinal nodes, anterior interaxillary pathway and Rt axillo-inguinal pathway and then Rt chest towards pathways and then in Lt sidelying posterior interaxillary work, then finished retracing all steps in supine reviewing with pt sequence and ocrrect direction of skin stretches throughout    Passive ROM to the Rt shoulder into flexion and abduction and D2                          PT Long Term Goals - 01/15/21 1527       PT LONG TERM GOAL #1   Title Patient will demonstrate she has regained full shoulder ROM and function post operatively compared ot baselines.    Time 4    Period Weeks    Status New    Target Date 02/12/21  PT LONG TERM GOAL #2   Title Patient will increase right shoulder flexion AROM to be >/= 160 degrees for increased ease reaching.    Baseline 133 post op; 163 pre-op    Time 4    Period Weeks    Status New    Target Date 02/12/21      PT LONG TERM GOAL #3   Title Patient will increase right shoulder active abduction to >/= 160 degrees for increased ease reaching and tolerating daily tasks.    Baseline 143 post op; 167 pre-op4    Time 4    Period Weeks    Status New    Target Date 02/12/21      PT LONG TERM GOAL #4   Title Patient will improve her DASH score to be </= 16 for improved overall UE function.    Baseline 15.91 pre op and 25 post op    Time 4    Period Weeks    Status New    Target Date 02/12/21      PT LONG TERM GOAL #5   Title Patient will report >/= 25% improvement in left upper quadrant swelling.    Time 4    Period Weeks    Status New    Target Date 02/12/21                   Plan - 01/24/21 0909     Clinical Impression  Statement Continued with manual lymph drainage to Rt upper quadrant reviewing this with pt throughout and answering her questions. Suggested she could instruct her husband on posterior inter-axillary as her swelling is closer to this anastomosis. Also continued with P/ROM of her Rt shoulder working towards her end available motions. Continued discussion of compression options for her lateral trunk as pt reports feeling frustrated she is still dealing with fullness, especially after she goes for her daily walks. Pt is going to call [facility] for an appt for a compression cami but also suggested sports bras until she can get an appt and to wear the compression foam she was issued at last session in the bra. Pt was receptive to this. Also reminded pt that she is only about 5 weeks postop and still having post op edema present at this stage is very normal and she should see this to cont to improve given more time and continuing with self MLD and beginning wear of compression. Pt able to verabalize good understanding.    Stability/Clinical Decision Making Stable/Uncomplicated    Rehab Potential Excellent    PT Frequency 2x / week    PT Duration 4 weeks    PT Treatment/Interventions ADLs/Self Care Home Management;Therapeutic exercise;Patient/family education;Manual techniques;Manual lymph drainage;Passive range of motion;Scar mobilization    PT Next Visit Plan how was foam? get a sports bra? appt at [facility]?  MLD and self MLD for right upper quadrant focused on upper and lateral back    PT Home Exercise Plan Post op shoulder HEP and closed chain flexion and abduction    Consulted and Agree with Plan of Care Patient             Patient will benefit from skilled therapeutic intervention in order to improve the following deficits and impairments:  Postural dysfunction, Decreased range of motion, Decreased knowledge of precautions, Impaired UE functional use, Pain, Increased edema, Decreased  scar mobility  Visit Diagnosis: Malignant neoplasm of upper-outer quadrant of right breast in female, estrogen receptor positive (  [place])  Abnormal posture  Aftercare following surgery for neoplasm  Localized edema     Problem List Patient Active Problem List   Diagnosis Date Noted   S/P mastectomy, right 12/13/2020   Malignant neoplasm of upper-outer quadrant of right breast in female, estrogen receptor positive ([place]) 11/13/2020   Chronic pain of left knee 04/15/2017   Hx of adenomatous polyp of colon 07/18/2016   Ventral hernia 11/15/2015   Routine general medical examination at a health care facility 11/24/2014   Anxiety    HEARING LOSS 03/14/2010   Obesity 03/06/2010   IRRITABLE BOWEL SYNDROME 03/06/2010    Otelia Limes, PTA 01/24/2021, 12:45 PM  [facility] @ [place], [place], [address] Phone: [phone]   Fax:  [phone]  Name: DYNESHIA BACCAM MRN: [id number] Date of Birth: [birth year]

## 2021-01-26 ENCOUNTER — Ambulatory Visit: Payer: 59

## 2021-01-26 ENCOUNTER — Other Ambulatory Visit: Payer: Self-pay

## 2021-01-26 DIAGNOSIS — C50411 Malignant neoplasm of upper-outer quadrant of right female breast: Secondary | ICD-10-CM

## 2021-01-26 DIAGNOSIS — R293 Abnormal posture: Secondary | ICD-10-CM | POA: Diagnosis not present

## 2021-01-26 DIAGNOSIS — M6281 Muscle weakness (generalized): Secondary | ICD-10-CM | POA: Diagnosis not present

## 2021-01-26 DIAGNOSIS — Z170 Estrogen receptor positive status [ER+]: Secondary | ICD-10-CM

## 2021-01-26 DIAGNOSIS — M25521 Pain in right elbow: Secondary | ICD-10-CM | POA: Diagnosis not present

## 2021-01-26 DIAGNOSIS — Z483 Aftercare following surgery for neoplasm: Secondary | ICD-10-CM | POA: Diagnosis not present

## 2021-01-26 DIAGNOSIS — M25611 Stiffness of right shoulder, not elsewhere classified: Secondary | ICD-10-CM | POA: Diagnosis not present

## 2021-01-26 DIAGNOSIS — R600 Localized edema: Secondary | ICD-10-CM

## 2021-01-26 NOTE — Therapy (Signed)
[facility] @ [address] [place], [place], [address] Phone: [phone]   Fax:  [phone]  Physical Therapy Treatment  Patient Details  Name: Donna Robbins MRN: [id number] Date of Birth: 10-11-1970 Referring Provider (PT): Dr. Coralie Keens   Encounter Date: 01/26/2021   PT End of Session - 01/26/21 1238     Visit Number 5    Number of Visits 10    Date for PT Re-Evaluation 02/12/21    PT Start Time 1004    PT Stop Time 1111    PT Time Calculation (min) 67 min    Activity Tolerance Patient tolerated treatment well    Behavior During Therapy [facility] for tasks assessed/performed             Past Medical History:  Diagnosis Date   Allergy    Anxiety    Breast cancer ([place])    Depression    Hx of adenomatous polyp of colon 07/18/2016   Irritable bowel syndrome    OBESITY     Past Surgical History:  Procedure Laterality Date   CESAREAN SECTION     x's 2   CHOLECYSTECTOMY  2009   ENT surgery  2004   MASTECTOMY W/ SENTINEL NODE BIOPSY Right 12/13/2020   Procedure: RIGHT MASTECTOMY WITH SENTINEL LYMPH NODE BIOPSY;  Surgeon: Coralie Keens, MD;  Location: [facility];  Service: General;  Laterality: Right;    There were no vitals filed for this visit.   Subjective Assessment - 01/26/21 1008     Subjective I tried teaching my husband how to do the posterior inter-axillary anastomosis but his pressure is too hard. Maybe I can teach my son.    Pertinent History Patient was diagnosed on 10/24/2020 with right grade I invasive ductal carcinoma breast cancer. She underwent a right mastectomy and sentinel node bipsy (5 negative nodes) on 9/21/2022It is ER/PR positive and HER2 negative with a Ki67 of 1%.    Patient Stated Goals See if we can improve my swelling and scar tissue    Currently in Pain? No/denies                               [facility] Adult PT Treatment/Exercise - 01/26/21 0001        Manual Therapy   Manual Therapy Taping;Myofascial release;Manual Lymphatic Drainage (MLD);Passive ROM    Myofascial Release To Rt axilla and into medial upper arm where 2 cords palpable, 1 small pop noted during session in axilla    Manual Lymphatic Drainage (MLD) Short neck, 5 diaphragmatic breaths, Lt axillary nodes, Rt inguinal nodes, anterior interaxillary pathway and Rt axillo-inguinal pathway and then Rt chest towards pathways and then in Lt sidelying posterior interaxillary work, then finished retracing all steps in supine reviewing with pt sequence and ocrrect direction of skin stretches throughout    Passive ROM to the Rt shoulder into flexion and abduction for MFR, pt has full motion    Kinesiotex Edema      Kinesiotix   Edema Trial of kinesiotape: First placed along Rt axillo-inguinal anastomosis with bucket below transverse watershed with 3 fingers extending towards lateral side of masectomy incision staying inferior; second placed along posterior inter-axillary anastomosis with bucket to the Lt of the medial watershed and 2 fingers extending towards Rt lateral trunk inferior to axilla where fullness visible. Pt instructed to remove if any pain or increased irritation noted and to assess skin  once removed.                          PT Long Term Goals - 01/15/21 1527       PT LONG TERM GOAL #1   Title Patient will demonstrate she has regained full shoulder ROM and function post operatively compared ot baselines.    Time 4    Period Weeks    Status New    Target Date 02/12/21      PT LONG TERM GOAL #2   Title Patient will increase right shoulder flexion AROM to be >/= 160 degrees for increased ease reaching.    Baseline 133 post op; 163 pre-op    Time 4    Period Weeks    Status New    Target Date 02/12/21      PT LONG TERM GOAL #3   Title Patient will increase right shoulder active abduction to >/= 160 degrees for increased ease reaching and tolerating daily  tasks.    Baseline 143 post op; 167 pre-op4    Time 4    Period Weeks    Status New    Target Date 02/12/21      PT LONG TERM GOAL #4   Title Patient will improve her DASH score to be </= 16 for improved overall UE function.    Baseline 15.91 pre op and 25 post op    Time 4    Period Weeks    Status New    Target Date 02/12/21      PT LONG TERM GOAL #5   Title Patient will report >/= 25% improvement in left upper quadrant swelling.    Time 4    Period Weeks    Status New    Target Date 02/12/21                   Plan - 01/26/21 1245     Clinical Impression Statement Continued with manual lymph drainage to Rt lateral trunk. Also continued with MFR to cording in Rt axilla to medial upper arm where cording palpable. Pt reports this feeling much looser after. At end of session trial of kinesiotape placed in 2 locations (see flowsheet) to see if this will help promote lymphatic drainage to neighboring intact quadrants as done with MLD. Pt was instructed to remove if noticing skin irritation and to assess skin once removed. She plans to remove these tomorrow when she gets her massage and will assess skin then. Also reviewed doorway end ROM stretches today as well. During MFR to cording 1 small pop noted at 1 of axillary cords. There are 2 and they were less visible by end of session.    Stability/Clinical Decision Making Stable/Uncomplicated    Rehab Potential Excellent    PT Frequency 2x / week    PT Duration 4 weeks    PT Treatment/Interventions ADLs/Self Care Home Management;Therapeutic exercise;Patient/family education;Manual techniques;Manual lymph drainage;Passive range of motion;Scar mobilization    PT Next Visit Plan How was trial of kinesiotape?  get a sports bra? appt at [facility]?  MLD and self MLD for right upper quadrant focused on upper and lateral back    PT Home Exercise Plan Post op shoulder HEP and closed chain flexion and abduction    Consulted and Agree  with Plan of Care Patient             Patient will benefit from skilled therapeutic intervention in  order to improve the following deficits and impairments:  Postural dysfunction, Decreased range of motion, Decreased knowledge of precautions, Impaired UE functional use, Pain, Increased edema, Decreased scar mobility  Visit Diagnosis: Malignant neoplasm of upper-outer quadrant of right breast in female, estrogen receptor positive (HCC)  Abnormal posture  Aftercare following surgery for neoplasm  Localized edema     Problem List Patient Active Problem List   Diagnosis Date Noted   S/P mastectomy, right 12/13/2020   Malignant neoplasm of upper-outer quadrant of right breast in female, estrogen receptor positive ([place]) 11/13/2020   Chronic pain of left knee 04/15/2017   Hx of adenomatous polyp of colon 07/18/2016   Ventral hernia 11/15/2015   Routine general medical examination at a health care facility 11/24/2014   Anxiety    HEARING LOSS 03/14/2010   Obesity 03/06/2010   IRRITABLE BOWEL SYNDROME 03/06/2010    Otelia Limes, PTA 01/26/2021, 12:52 PM  [facility] @ [place], [place], [address] Phone: [phone]   Fax:  [phone]  Name: WEALTHY DANIELSKI MRN: [id number] Date of Birth: 1971/01/19

## 2021-01-29 ENCOUNTER — Ambulatory Visit: Payer: 59

## 2021-01-29 ENCOUNTER — Other Ambulatory Visit: Payer: Self-pay

## 2021-01-29 DIAGNOSIS — R600 Localized edema: Secondary | ICD-10-CM

## 2021-01-29 DIAGNOSIS — R293 Abnormal posture: Secondary | ICD-10-CM | POA: Diagnosis not present

## 2021-01-29 DIAGNOSIS — M25611 Stiffness of right shoulder, not elsewhere classified: Secondary | ICD-10-CM

## 2021-01-29 DIAGNOSIS — Z483 Aftercare following surgery for neoplasm: Secondary | ICD-10-CM | POA: Diagnosis not present

## 2021-01-29 DIAGNOSIS — M6281 Muscle weakness (generalized): Secondary | ICD-10-CM

## 2021-01-29 DIAGNOSIS — M25521 Pain in right elbow: Secondary | ICD-10-CM | POA: Diagnosis not present

## 2021-01-29 DIAGNOSIS — Z170 Estrogen receptor positive status [ER+]: Secondary | ICD-10-CM

## 2021-01-29 DIAGNOSIS — C50411 Malignant neoplasm of upper-outer quadrant of right female breast: Secondary | ICD-10-CM | POA: Diagnosis not present

## 2021-01-29 NOTE — Therapy (Signed)
[facility] @ [address] [place] [place], [place], [address] Phone: [phone]   Fax:  [phone]  Physical Therapy Treatment  Patient Details  Name: Donna Robbins MRN: [id number] Date of Birth: [birth year] Referring Provider (PT): Dr. Coralie Keens   Encounter Date: 01/29/2021   PT End of Session - 01/29/21 1206     Visit Number 6    Number of Visits 10    Date for PT Re-Evaluation 02/12/21    PT Start Time 1005    PT Stop Time 1100    PT Time Calculation (min) 55 min    Activity Tolerance Patient tolerated treatment well    Behavior During Therapy [facility] for tasks assessed/performed             Past Medical History:  Diagnosis Date   Allergy    Anxiety    Breast cancer ([place])    Depression    Hx of adenomatous polyp of colon 07/18/2016   Irritable bowel syndrome    OBESITY     Past Surgical History:  Procedure Laterality Date   CESAREAN SECTION     x's 2   CHOLECYSTECTOMY  2009   ENT surgery  2004   MASTECTOMY W/ SENTINEL NODE BIOPSY Right 12/13/2020   Procedure: RIGHT MASTECTOMY WITH SENTINEL LYMPH NODE BIOPSY;  Surgeon: Coralie Keens, MD;  Location: [facility];  Service: General;  Laterality: Right;    There were no vitals filed for this visit.   Subjective Assessment - 01/29/21 1006     Subjective I think the swelling is a little better.  I felt better for several days after I was here and the cording was much better.  I did my crunch routine and shortly thereafter my cording got worse and is still really bothering me.  I couldn't see a big difference with the tape but I think it might have helped.  I am going to buy tape and do it at home.  I am doing the MLD and it does feel better afterwards.  I go today to try on a compression Cami at [facility].    Pertinent History Patient was diagnosed on 10/24/2020 with right grade I invasive ductal carcinoma breast cancer. She underwent a right mastectomy  and sentinel node bipsy (5 negative nodes) on 9/21/2022It is ER/PR positive and HER2 negative with a Ki67 of 1%.    Currently in Pain? No/denies                               [facility] Adult PT Treatment/Exercise - 01/29/21 0001       Manual Therapy   Manual Therapy Edema management;Myofascial release;Manual Lymphatic Drainage (MLD);Passive ROM    Myofascial Release To Rt axilla and into medial upper arm where multiple cords palpable, 2 small pops noted during session near elbow    Manual Lymphatic Drainage (MLD) Short neck, 5 diaphragmatic breaths, Lt axillary nodes, Rt inguinal nodes, anterior interaxillary pathway and Rt axillo-inguinal pathway and then Rt chest towards pathways and then in Lt sidelying posterior interaxillary work, then finished retracing all steps in supine reviewing with pt sequence and correct direction of skin stretches .   Passive ROM to the Rt shoulder into flexion and abduction for MFR, pt has full motion      Kinesiotix   Edema pt is going to buy tape and do at home.  PT Long Term Goals - 01/15/21 1527       PT LONG TERM GOAL #1   Title Patient will demonstrate she has regained full shoulder ROM and function post operatively compared ot baselines.    Time 4    Period Weeks    Status New    Target Date 02/12/21      PT LONG TERM GOAL #2   Title Patient will increase right shoulder flexion AROM to be >/= 160 degrees for increased ease reaching.    Baseline 133 post op; 163 pre-op    Time 4    Period Weeks    Status New    Target Date 02/12/21      PT LONG TERM GOAL #3   Title Patient will increase right shoulder active abduction to >/= 160 degrees for increased ease reaching and tolerating daily tasks.    Baseline 143 post op; 167 pre-op4    Time 4    Period Weeks    Status New    Target Date 02/12/21      PT LONG TERM GOAL #4   Title Patient will improve her DASH score to be </= 16 for  improved overall UE function.    Baseline 15.91 pre op and 25 post op    Time 4    Period Weeks    Status New    Target Date 02/12/21      PT LONG TERM GOAL #5   Title Patient will report >/= 25% improvement in left upper quadrant swelling.    Time 4    Period Weeks    Status New    Target Date 02/12/21                   Plan - 01/29/21 1206     Clinical Impression Statement Continued with MFR techniques to cording with 2 small pops noted in the elbow region.  Discussed and performed MLD to Right lateral trunk region and answered pt questions about direction and stretch she is applying.  She has a good sense of MLD and uses good stretch.  No skin irritation with tape and pt is going to purchase and do at home since she has photos on how to apply.  Pt felt much looser after MFR techniques.    Stability/Clinical Decision Making Stable/Uncomplicated    Rehab Potential Excellent    PT Frequency 2x / week    PT Duration 4 weeks    PT Treatment/Interventions ADLs/Self Care Home Management;Therapeutic exercise;Patient/family education;Manual techniques;Manual lymph drainage;Passive range of motion;Scar mobilization    PT Next Visit Plan did she get compression cami ? MFR to Right axilla/UE, continue MLD and Self MLD to right upper quadrant. Pt to tape at home.    PT Home Exercise Plan Post op shoulder HEP and closed chain flexion and abduction    Consulted and Agree with Plan of Care Patient             Patient will benefit from skilled therapeutic intervention in order to improve the following deficits and impairments:  Postural dysfunction, Decreased range of motion, Decreased knowledge of precautions, Impaired UE functional use, Pain, Increased edema, Decreased scar mobility  Visit Diagnosis: Malignant neoplasm of upper-outer quadrant of right breast in female, estrogen receptor positive (HCC)  Abnormal posture  Aftercare following surgery for neoplasm  Localized  edema  Stiffness of right shoulder, not elsewhere classified  Pain in right elbow  Muscle weakness (generalized)     Problem  List Patient Active Problem List   Diagnosis Date Noted   S/P mastectomy, right 12/13/2020   Malignant neoplasm of upper-outer quadrant of right breast in female, estrogen receptor positive ([place]) 11/13/2020   Chronic pain of left knee 04/15/2017   Hx of adenomatous polyp of colon 07/18/2016   Ventral hernia 11/15/2015   Routine general medical examination at a health care facility 11/24/2014   Anxiety    HEARING LOSS 03/14/2010   Obesity 03/06/2010   IRRITABLE BOWEL SYNDROME 03/06/2010    Claris Pong, PT 01/29/2021, 12:11 PM  [facility] @ [place], [place], [address] Phone: [phone]   Fax:  [phone]  Name: CLODAGH ODENTHAL MRN: [id number] Date of Birth: January 20, 1971

## 2021-01-30 DIAGNOSIS — H524 Presbyopia: Secondary | ICD-10-CM | POA: Diagnosis not present

## 2021-01-30 DIAGNOSIS — H40013 Open angle with borderline findings, low risk, bilateral: Secondary | ICD-10-CM | POA: Diagnosis not present

## 2021-01-31 ENCOUNTER — Ambulatory Visit: Payer: 59

## 2021-01-31 ENCOUNTER — Other Ambulatory Visit: Payer: Self-pay

## 2021-01-31 DIAGNOSIS — M25521 Pain in right elbow: Secondary | ICD-10-CM

## 2021-01-31 DIAGNOSIS — M25611 Stiffness of right shoulder, not elsewhere classified: Secondary | ICD-10-CM | POA: Diagnosis not present

## 2021-01-31 DIAGNOSIS — C50411 Malignant neoplasm of upper-outer quadrant of right female breast: Secondary | ICD-10-CM | POA: Diagnosis not present

## 2021-01-31 DIAGNOSIS — Z170 Estrogen receptor positive status [ER+]: Secondary | ICD-10-CM | POA: Diagnosis not present

## 2021-01-31 DIAGNOSIS — M6281 Muscle weakness (generalized): Secondary | ICD-10-CM | POA: Diagnosis not present

## 2021-01-31 DIAGNOSIS — R600 Localized edema: Secondary | ICD-10-CM | POA: Diagnosis not present

## 2021-01-31 DIAGNOSIS — R293 Abnormal posture: Secondary | ICD-10-CM

## 2021-01-31 DIAGNOSIS — Z483 Aftercare following surgery for neoplasm: Secondary | ICD-10-CM

## 2021-01-31 NOTE — Therapy (Signed)
[facility] @ [place], [place], [address] Phone: [phone]   Fax:  [phone]  Physical Therapy Treatment  Patient Details  Name: Donna Robbins MRN: [id number] Date of Birth: [birth year] Referring Provider (PT): Dr. Coralie Keens   Encounter Date: 01/31/2021   PT End of Session - 01/31/21 1456     Visit Number 7    Number of Visits 10    Date for PT Re-Evaluation 02/12/21    PT Start Time 1401    PT Stop Time 1455    PT Time Calculation (min) 54 min    Activity Tolerance Patient tolerated treatment well    Behavior During Therapy [facility] for tasks assessed/performed             Past Medical History:  Diagnosis Date   Allergy    Anxiety    Breast cancer ([place])    Depression    Hx of adenomatous polyp of colon 07/18/2016   Irritable bowel syndrome    OBESITY     Past Surgical History:  Procedure Laterality Date   CESAREAN SECTION     x's 2   CHOLECYSTECTOMY  2009   ENT surgery  2004   MASTECTOMY W/ SENTINEL NODE BIOPSY Right 12/13/2020   Procedure: RIGHT MASTECTOMY WITH SENTINEL LYMPH NODE BIOPSY;  Surgeon: Coralie Keens, MD;  Location: [facility];  Service: General;  Laterality: Right;    There were no vitals filed for this visit.   Subjective Assessment - 01/31/21 1456     Subjective I want to try all the different compressions so I know what works the best.  Can you check my bras?  The cording seems to be alittle better and it seemed looser when I left last time.    Pertinent History Patient was diagnosed on 10/24/2020 with right grade I invasive ductal carcinoma breast cancer. She underwent a right mastectomy and sentinel node bipsy (5 negative nodes) on 9/21/2022It is ER/PR positive and HER2 negative with a Ki67 of 1%.                               [facility] Adult PT Treatment/Exercise - 01/31/21 0001       Manual Therapy   Edema Management Reviewed bras with foam  pad and discussed cami options and night options    Myofascial Release To Rt axilla and into medial upper arm where multiple cords palpable, 1 small pops noted during session near elbow    Manual Lymphatic Drainage (MLD) Short neck, 5 diaphragmatic breaths, Lt axillary nodes, Rt inguinal nodes, anterior interaxillary pathway and Rt axillo-inguinal pathway and then Rt chest towards pathways and then in Lt sidelying posterior interaxillary work, then finished retracing all steps in supine reviewing with pt sequence and correct direction of skin stretches throughout    Passive ROM to the Rt shoulder into flexion and abduction for MFR, pt has full motion                          PT Long Term Goals - 01/15/21 1527       PT LONG TERM GOAL #1   Title Patient will demonstrate she has regained full shoulder ROM and function post operatively compared ot baselines.    Time 4    Period Weeks    Status New    Target Date 02/12/21  PT LONG TERM GOAL #2   Title Patient will increase right shoulder flexion AROM to be >/= 160 degrees for increased ease reaching.    Baseline 133 post op; 163 pre-op    Time 4    Period Weeks    Status New    Target Date 02/12/21      PT LONG TERM GOAL #3   Title Patient will increase right shoulder active abduction to >/= 160 degrees for increased ease reaching and tolerating daily tasks.    Baseline 143 post op; 167 pre-op4    Time 4    Period Weeks    Status New    Target Date 02/12/21      PT LONG TERM GOAL #4   Title Patient will improve her DASH score to be </= 16 for improved overall UE function.    Baseline 15.91 pre op and 25 post op    Time 4    Period Weeks    Status New    Target Date 02/12/21      PT LONG TERM GOAL #5   Title Patient will report >/= 25% improvement in left upper quadrant swelling.    Time 4    Period Weeks    Status New    Target Date 02/12/21                   Plan - 01/31/21 1457      Clinical Impression Statement Continued MFR to areas of cording with 1 pop noted in elbow region.  Cords still present but not too limiting at this point.  Reviewed different compression options for chest/trunk/back swelling.  Pt going to [facility] this afternoon to try compression cami.  Decreased swelling noted at right back and chest, mild at inferior axillary region. Cut several more pieces of TG soft for pts foam pads    Stability/Clinical Decision Making Stable/Uncomplicated    Rehab Potential Excellent    PT Frequency 2x / week    PT Duration 4 weeks    PT Treatment/Interventions ADLs/Self Care Home Management;Therapeutic exercise;Patient/family education;Manual techniques;Manual lymph drainage;Passive range of motion;Scar mobilization    PT Next Visit Plan did she get compression cami ? MFR to Right axilla/UE, continue MLD and Self MLD to right upper quadrant. Pt to tape at home.    PT Home Exercise Plan Post op shoulder HEP and closed chain flexion and abduction    Consulted and Agree with Plan of Care Patient             Patient will benefit from skilled therapeutic intervention in order to improve the following deficits and impairments:  Postural dysfunction, Decreased range of motion, Decreased knowledge of precautions, Impaired UE functional use, Pain, Increased edema, Decreased scar mobility  Visit Diagnosis: Malignant neoplasm of upper-outer quadrant of right breast in female, estrogen receptor positive ([place])  Abnormal posture  Aftercare following surgery for neoplasm  Localized edema  Stiffness of right shoulder, not elsewhere classified  Pain in right elbow     Problem List Patient Active Problem List   Diagnosis Date Noted   S/P mastectomy, right 12/13/2020   Malignant neoplasm of upper-outer quadrant of right breast in female, estrogen receptor positive ([place]) 11/13/2020   Chronic pain of left knee 04/15/2017   Hx of adenomatous polyp of colon  07/18/2016   Ventral hernia 11/15/2015   Routine general medical examination at a health care facility 11/24/2014   Anxiety    HEARING LOSS 03/14/2010  Obesity 03/06/2010   IRRITABLE BOWEL SYNDROME 03/06/2010    Claris Pong, PT 01/31/2021, 3:00 PM  [facility] @ [address] [place], [place], [address] Phone: [phone]   Fax:  [phone]  Name: Donna Robbins MRN: [id number] Date of Birth: Aug 06, 1970

## 2021-02-01 DIAGNOSIS — C50911 Malignant neoplasm of unspecified site of right female breast: Secondary | ICD-10-CM | POA: Diagnosis not present

## 2021-02-05 ENCOUNTER — Ambulatory Visit: Payer: 59

## 2021-02-05 ENCOUNTER — Other Ambulatory Visit: Payer: Self-pay

## 2021-02-05 DIAGNOSIS — M25611 Stiffness of right shoulder, not elsewhere classified: Secondary | ICD-10-CM

## 2021-02-05 DIAGNOSIS — R600 Localized edema: Secondary | ICD-10-CM | POA: Diagnosis not present

## 2021-02-05 DIAGNOSIS — C50411 Malignant neoplasm of upper-outer quadrant of right female breast: Secondary | ICD-10-CM | POA: Diagnosis not present

## 2021-02-05 DIAGNOSIS — M25521 Pain in right elbow: Secondary | ICD-10-CM | POA: Diagnosis not present

## 2021-02-05 DIAGNOSIS — Z170 Estrogen receptor positive status [ER+]: Secondary | ICD-10-CM | POA: Diagnosis not present

## 2021-02-05 DIAGNOSIS — R293 Abnormal posture: Secondary | ICD-10-CM

## 2021-02-05 DIAGNOSIS — M6281 Muscle weakness (generalized): Secondary | ICD-10-CM | POA: Diagnosis not present

## 2021-02-05 DIAGNOSIS — Z483 Aftercare following surgery for neoplasm: Secondary | ICD-10-CM

## 2021-02-05 NOTE — Therapy (Signed)
[place] @ [facility] [employer] [place], [place], [address] Phone: [phone]   Fax:  [phone]  Physical Therapy Treatment  Patient Details  Name: Donna Robbins MRN: [id number] Date of Birth: 1971/01/06 Referring Provider (PT): Dr. Coralie Keens   Encounter Date: 02/05/2021   PT End of Session - 02/05/21 1246     Visit Number 8    Number of Visits 10    Date for PT Re-Evaluation 02/12/21    PT Start Time 1004    PT Stop Time 1100    PT Time Calculation (min) 56 min    Activity Tolerance Patient tolerated treatment well    Behavior During Therapy [facility] for tasks assessed/performed             Past Medical History:  Diagnosis Date   Allergy    Anxiety    Breast cancer ([place])    Depression    Hx of adenomatous polyp of colon 07/18/2016   Irritable bowel syndrome    OBESITY     Past Surgical History:  Procedure Laterality Date   CESAREAN SECTION     x's 2   CHOLECYSTECTOMY  2009   ENT surgery  2004   MASTECTOMY W/ SENTINEL NODE BIOPSY Right 12/13/2020   Procedure: RIGHT MASTECTOMY WITH SENTINEL LYMPH NODE BIOPSY;  Surgeon: Coralie Keens, MD;  Location: [place];  Service: General;  Laterality: Right;    There were no vitals filed for this visit.   Subjective Assessment - 02/05/21 1003     Subjective Doing good, but over the last  week I have felt cold on the right side  my arm radiating to my neck.  If I take the bra off it helps.At night I am wearing compression, but over the last couple of days I have not worn the foam pads for compression.  My arm works but it is uncomfortable and it was getting me down a bit. My arm gets cold but not to the touch, but internally, and my forearm is hypersensitive to my touch. the left half of my face has been a little numb too. I see my primary care on Wednesday. Cording is better when I stretch.  Worse if I wash dishes because it will seize up.    Pertinent History  Patient was diagnosed on 10/24/2020 with right grade I invasive ductal carcinoma breast cancer. She underwent a right mastectomy and sentinel node bipsy (5 negative nodes) on 9/21/2022It is ER/PR positive and HER2 negative with a Ki67 of 1%.                [facility] PT Assessment - 02/05/21 0001       AROM   Right Shoulder Extension 58 Degrees    Right Shoulder Flexion 168 Degrees    Right Shoulder ABduction 174 Degrees    Right Shoulder Internal Rotation 85 Degrees    Right Shoulder External Rotation 109 Degrees                           [facility] Adult PT Treatment/Exercise - 02/05/21 0001       Shoulder Exercises: Standing   Extension Strengthening;Both;10 reps    Theraband Level (Shoulder Extension) Level 1 (Yellow)    Retraction Strengthening;Both;10 reps    Theraband Level (Shoulder Retraction) Level 1 (Yellow)      Shoulder Exercises: Pulleys   Flexion 1 minute    Scaption 1 minute  ABduction 1 minute      Manual Therapy   Myofascial Release To Rt axilla and into medial upper arm where multiple cords palpable,    Passive ROM to the Rt shoulder into flexion and abduction for MFR, pt has full motion      Kinesiotix   Edema pt given some peach comprex to try as it is thinner and will take up less space in her bra                     PT Education - 02/05/21 1245     Education Details Scapular retraction, shoulder ext with band    Person(s) Educated Patient    Methods Demonstration;Handout    Comprehension Returned demonstration                 PT Long Term Goals - 02/05/21 1045       PT LONG TERM GOAL #1   Title Patient will demonstrate she has regained full shoulder ROM and function post operatively compared ot baselines.    Baseline full motion but does not have quite normal function compared to pre-surgery.    Time 4    Period Weeks    Status On-going      PT LONG TERM GOAL #2   Title Patient will increase right shoulder  flexion AROM to be >/= 160 degrees for increased ease reaching.    Time 4    Period Weeks    Status Achieved    Target Date 02/05/21      PT LONG TERM GOAL #3   Title Patient will increase right shoulder active abduction to >/= 160 degrees for increased ease reaching and tolerating daily tasks.    Time 4    Period Weeks    Status Achieved    Target Date 02/05/21      PT LONG TERM GOAL #4   Title Patient will improve her DASH score to be </= 16 for improved overall UE function.    Baseline 15.91 pre op and 25 post op    Time 4    Period Weeks    Status On-going      PT LONG TERM GOAL #5   Title Patient will report >/= 25% improvement in left upper quadrant swelling.    Period Weeks    Status Achieved    Target Date 02/05/21      Additional Long Term Goals   Additional Long Term Goals Yes      PT LONG TERM GOAL #6   Title pt will be progressed and independent with  right UE strengthening,.    Time 3    Period Weeks    Status New                   Plan - 02/05/21 1246     Clinical Impression Statement Pt is complaining of numbness and cold feelings in right UE into UT and now right side of face.  This seems to be worse with inactivity and improves with stretching. Her Right UE did not feel cold to palpation today even though she felt it was cold.  We discussed setting a timer so she can do a few stretches every 10-20 minutes and also following up with her physician..  Cording is still very present but are not limiting ROM.  She has achieved her LTGs for ROM of flexion and abduction.  She has achieved baseline ROM but not yet baseline function.  She was given several pieces of peach foam and stockinette to try and use for swelling at chest/lateral trunk because she felt the foam was too thick and was making her bra too tight.    Stability/Clinical Decision Making Stable/Uncomplicated    Rehab Potential Excellent    PT Frequency 2x / week    PT Duration 4 weeks    PT  Treatment/Interventions ADLs/Self Care Home Management;Therapeutic exercise;Patient/family education;Manual techniques;Manual lymph drainage;Passive range of motion;Scar mobilization    PT Next Visit Plan did she get compression cami ? MFR to Right axilla/UE, continue MLD and Self MLD to right upper quadrant, supine scap series Pt to tape at home, did she try peach foam?    Consulted and Agree with Plan of Care Patient             Patient will benefit from skilled therapeutic intervention in order to improve the following deficits and impairments:  Postural dysfunction, Decreased range of motion, Decreased knowledge of precautions, Impaired UE functional use, Pain, Increased edema, Decreased scar mobility  Visit Diagnosis: Malignant neoplasm of upper-outer quadrant of right breast in female, estrogen receptor positive ([place])  Abnormal posture  Aftercare following surgery for neoplasm  Localized edema  Stiffness of right shoulder, not elsewhere classified     Problem List Patient Active Problem List   Diagnosis Date Noted   S/P mastectomy, right 12/13/2020   Malignant neoplasm of upper-outer quadrant of right breast in female, estrogen receptor positive ([place]) 11/13/2020   Chronic pain of left knee 04/15/2017   Hx of adenomatous polyp of colon 07/18/2016   Ventral hernia 11/15/2015   Routine general medical examination at a health care facility 11/24/2014   Anxiety    HEARING LOSS 03/14/2010   Obesity 03/06/2010   IRRITABLE BOWEL SYNDROME 03/06/2010    Claris Pong, PT 02/05/2021, 12:53 PM  [place] @ [place], [place], [address] Phone: [phone]   Fax:  [phone]  Name: Donna Robbins MRN: [id number] Date of Birth: 12-21-1970

## 2021-02-05 NOTE — Patient Instructions (Signed)
Access Code: [email or web address] URL: [email or web address] Date: 02/05/2021 Prepared by: Cheral Almas  Exercises Scapular Retraction with Resistance - 1 x daily - 3 x weekly - 1 sets - 10 reps Shoulder Extension with Resistance - 1 x daily - 1 sets - 10 reps

## 2021-02-06 ENCOUNTER — Inpatient Hospital Stay: Payer: 59

## 2021-02-06 ENCOUNTER — Ambulatory Visit: Payer: 59 | Admitting: Oncology

## 2021-02-07 ENCOUNTER — Ambulatory Visit (INDEPENDENT_AMBULATORY_CARE_PROVIDER_SITE_OTHER): Payer: 59 | Admitting: Internal Medicine

## 2021-02-07 ENCOUNTER — Other Ambulatory Visit: Payer: Self-pay

## 2021-02-07 ENCOUNTER — Ambulatory Visit: Payer: 59

## 2021-02-07 ENCOUNTER — Encounter: Payer: Self-pay | Admitting: Internal Medicine

## 2021-02-07 VITALS — BP 122/80 | HR 59 | Resp 18 | Ht 63.0 in | Wt 171.2 lb

## 2021-02-07 DIAGNOSIS — M25611 Stiffness of right shoulder, not elsewhere classified: Secondary | ICD-10-CM

## 2021-02-07 DIAGNOSIS — Z483 Aftercare following surgery for neoplasm: Secondary | ICD-10-CM | POA: Diagnosis not present

## 2021-02-07 DIAGNOSIS — R293 Abnormal posture: Secondary | ICD-10-CM

## 2021-02-07 DIAGNOSIS — F419 Anxiety disorder, unspecified: Secondary | ICD-10-CM

## 2021-02-07 DIAGNOSIS — M25521 Pain in right elbow: Secondary | ICD-10-CM | POA: Diagnosis not present

## 2021-02-07 DIAGNOSIS — M25562 Pain in left knee: Secondary | ICD-10-CM | POA: Diagnosis not present

## 2021-02-07 DIAGNOSIS — G8929 Other chronic pain: Secondary | ICD-10-CM | POA: Diagnosis not present

## 2021-02-07 DIAGNOSIS — Z170 Estrogen receptor positive status [ER+]: Secondary | ICD-10-CM | POA: Diagnosis not present

## 2021-02-07 DIAGNOSIS — R600 Localized edema: Secondary | ICD-10-CM

## 2021-02-07 DIAGNOSIS — Z0000 Encounter for general adult medical examination without abnormal findings: Secondary | ICD-10-CM | POA: Diagnosis not present

## 2021-02-07 DIAGNOSIS — C50411 Malignant neoplasm of upper-outer quadrant of right female breast: Secondary | ICD-10-CM | POA: Diagnosis not present

## 2021-02-07 DIAGNOSIS — M6281 Muscle weakness (generalized): Secondary | ICD-10-CM | POA: Diagnosis not present

## 2021-02-07 NOTE — Progress Notes (Signed)
   Subjective:   Patient ID: Donna Robbins, female    DOB: [birth year], 50 y.o.   MRN: [id number]  HPI The patient is a 50 YO female coming in for physical.  PMH, [place], social history reviewed and updated  Review of Systems  Constitutional: Negative.   HENT: Negative.    Eyes: Negative.   Respiratory:  Negative for cough, chest tightness and shortness of breath.   Cardiovascular:  Negative for chest pain, palpitations and leg swelling.  Gastrointestinal:  Negative for abdominal distention, abdominal pain, constipation, diarrhea, nausea and vomiting.  Musculoskeletal:  Positive for myalgias.  Skin: Negative.   Neurological:  Positive for numbness.  Psychiatric/Behavioral: Negative.     Objective:  Physical Exam Constitutional:      Appearance: She is well-developed.  HENT:     Head: Normocephalic and atraumatic.  Cardiovascular:     Rate and Rhythm: Normal rate and regular rhythm.  Pulmonary:     Effort: Pulmonary effort is normal. No respiratory distress.     Breath sounds: Normal breath sounds. No wheezing or rales.  Abdominal:     General: Bowel sounds are normal. There is no distension.     Palpations: Abdomen is soft.     Tenderness: There is no abdominal tenderness. There is no rebound.  Musculoskeletal:     Cervical back: Normal range of motion.  Skin:    General: Skin is warm and dry.  Neurological:     Mental Status: She is alert and oriented to person, place, and time.     Coordination: Coordination normal.    Vitals:   02/07/21 1028  BP: 122/80  Pulse: (!) 59  Resp: 18  SpO2: 99%  Weight: 171 lb 3.2 oz (77.7 kg)  Height: 5\' 3"  (1.6 m)    This visit occurred during the SARS-CoV-2 public health emergency.  Safety protocols were in place, including screening questions prior to the visit, additional usage of staff PPE, and extensive cleaning of exam room while observing appropriate contact time as indicated for disinfecting solutions.   Assessment &  Plan:

## 2021-02-07 NOTE — Therapy (Signed)
[place] @ [place], [place], [address] Phone: [phone]   Fax:  [phone]  Physical Therapy Treatment  Patient Details  Name: Donna Robbins MRN: [id number] Date of Birth: 23-May-1970 Referring Provider (PT): Dr. Coralie Keens   Encounter Date: 02/07/2021   PT End of Session - 02/07/21 0937     Visit Number 9    Number of Visits 10    Date for PT Re-Evaluation 02/12/21    PT Start Time 0906    PT Stop Time 1000    PT Time Calculation (min) 54 min    Activity Tolerance Patient tolerated treatment well    Behavior During Therapy [facility] for tasks assessed/performed             Past Medical History:  Diagnosis Date   Allergy    Anxiety    Breast cancer ([place])    Depression    Hx of adenomatous polyp of colon 07/18/2016   Irritable bowel syndrome    OBESITY     Past Surgical History:  Procedure Laterality Date   CESAREAN SECTION     x's 2   CHOLECYSTECTOMY  2009   ENT surgery  2004   MASTECTOMY W/ SENTINEL NODE BIOPSY Right 12/13/2020   Procedure: RIGHT MASTECTOMY WITH SENTINEL LYMPH NODE BIOPSY;  Surgeon: Coralie Keens, MD;  Location: [place];  Service: General;  Laterality: Right;    There were no vitals filed for this visit.   Subjective Assessment - 02/07/21 0906     Subjective By the end of yesterday my arm felt really cold.  I feel it is directly related to binding from my bra.  Slept without my bra last night.  I felt better when I woke up, but I may have a little more puffiness.It seemed like everywhere the bra was it burned. by Mid day yesterday the cords were really active and my arm felt really cold but improved after releasing the cords.    Pertinent History Patient was diagnosed on 10/24/2020 with right grade I invasive ductal carcinoma breast cancer. She underwent a right mastectomy and sentinel node bipsy (5 negative nodes) on 9/21/2022It is ER/PR positive and HER2 negative  with a Ki67 of 1%.    Patient Stated Goals See if we can improve my swelling and scar tissue    Currently in Pain? Yes    Pain Score 1     Pain Location Axilla    Pain Orientation Right;Upper;Mid    Pain Type Surgical pain    Pain Onset 1 to 4 weeks ago    Pain Frequency Intermittent                               [facility] Adult PT Treatment/Exercise - 02/07/21 0001       Shoulder Exercises: Supine   Horizontal ABduction Strengthening;Both;10 reps    Theraband Level (Shoulder Horizontal ABduction) Level 1 (Yellow)    External Rotation Strengthening;Both;10 reps    Theraband Level (Shoulder External Rotation) Level 1 (Yellow)    Flexion Strengthening;Both;10 reps    Theraband Level (Shoulder Flexion) Level 1 (Yellow)    Diagonals Right;Left;10 reps    Theraband Level (Shoulder Diagonals) Level 1 (Yellow)      Manual Therapy   Myofascial Release To Rt axilla and into medial upper arm where multiple cords palpable,    Manual Lymphatic Drainage (MLD) Short neck, 5 diaphragmatic breaths,  Lt axillary nodes, Rt inguinal nodes, anterior interaxillary pathway and Rt axillo-inguinal pathway and then Rt chest towards pathways and then in Lt sidelying posterior interaxillary work, then finished retracing all steps in supine    Passive ROM to the Rt shoulder into flexion and abduction for MFR, pt has full motion                          PT Long Term Goals - 02/05/21 1045       PT LONG TERM GOAL #1   Title Patient will demonstrate she has regained full shoulder ROM and function post operatively compared ot baselines.    Baseline full motion but does not have quite normal function compared to pre-surgery.    Time 4    Period Weeks    Status On-going      PT LONG TERM GOAL #2   Title Patient will increase right shoulder flexion AROM to be >/= 160 degrees for increased ease reaching.    Time 4    Period Weeks    Status Achieved    Target Date 02/05/21       PT LONG TERM GOAL #3   Title Patient will increase right shoulder active abduction to >/= 160 degrees for increased ease reaching and tolerating daily tasks.    Time 4    Period Weeks    Status Achieved    Target Date 02/05/21      PT LONG TERM GOAL #4   Title Patient will improve her DASH score to be </= 16 for improved overall UE function.    Baseline 15.91 pre op and 25 post op    Time 4    Period Weeks    Status On-going      PT LONG TERM GOAL #5   Title Patient will report >/= 25% improvement in left upper quadrant swelling.    Period Weeks    Status Achieved    Target Date 02/05/21      Additional Long Term Goals   Additional Long Term Goals Yes      PT LONG TERM GOAL #6   Title pt will be progressed and independent with  right UE strengthening,.    Time 3    Period Weeks    Status New                   Plan - 02/07/21 3976     Clinical Impression Statement Multiple cords still present today throughout axillary upper arm and forearm, but no pops noted with MFR techniques. Pt was instructed in supine scapular series and did very well.  she was issued yellow theraband and handout to add to HEP. continued MLD to right chest area directing to pathways.  Swelling greatly improved overall. Pt went without her compression bra to sleep and felt much better.    Stability/Clinical Decision Making Stable/Uncomplicated    Rehab Potential Excellent    PT Frequency 2x / week    PT Duration 4 weeks    PT Treatment/Interventions ADLs/Self Care Home Management;Therapeutic exercise;Patient/family education;Manual techniques;Manual lymph drainage;Passive range of motion;Scar mobilization    PT Next Visit Plan Reasses,did she get compression cami ? MFR to Right axilla/UE, continue MLD and Self MLD to right upper quadrant. Pt to tape at home, did she try peach foam?    PT Home Exercise Plan Post op shoulder HEP and closed chain flexion and abduction, supine scap series, standing  SR,  ext    Consulted and Agree with Plan of Care Patient             Patient will benefit from skilled therapeutic intervention in order to improve the following deficits and impairments:  Postural dysfunction, Decreased range of motion, Decreased knowledge of precautions, Impaired UE functional use, Pain, Increased edema, Decreased scar mobility  Visit Diagnosis: Malignant neoplasm of upper-outer quadrant of right breast in female, estrogen receptor positive ([place])  Abnormal posture  Aftercare following surgery for neoplasm  Localized edema  Stiffness of right shoulder, not elsewhere classified  Pain in right elbow  Muscle weakness (generalized)     Problem List Patient Active Problem List   Diagnosis Date Noted   S/P mastectomy, right 12/13/2020   Malignant neoplasm of upper-outer quadrant of right breast in female, estrogen receptor positive ([place]) 11/13/2020   Chronic pain of left knee 04/15/2017   Hx of adenomatous polyp of colon 07/18/2016   Ventral hernia 11/15/2015   Routine general medical examination at a health care facility 11/24/2014   Anxiety    HEARING LOSS 03/14/2010   Obesity 03/06/2010   IRRITABLE BOWEL SYNDROME 03/06/2010    Claris Pong, PT 02/07/2021, 12:19 PM  [place] @ [place], [place], [address] Phone: [phone]   Fax:  [phone]  Name: Donna Robbins MRN: [id number] Date of Birth: March 22, 1971

## 2021-02-07 NOTE — Patient Instructions (Signed)
Over Head Pull: Narrow and Wide Grip   Cancer Rehab [phone]   On back, knees bent, feet flat, band across thighs, elbows straight but relaxed. Pull hands apart (start). Keeping elbows straight, bring arms up and over head, hands toward floor. Keep pull steady on band. Hold momentarily. Return slowly, keeping pull steady, back to start. Then do same with a wider grip on the band (past shoulder width) Repeat _5-10__ times. Band color __yellow____   Side Pull: Double Arm   On back, knees bent, feet flat. Arms perpendicular to body, shoulder level, elbows straight but relaxed. Pull arms out to sides, elbows straight. Resistance band comes across collarbones, hands toward floor. Hold momentarily. Slowly return to starting position. Repeat _5-10__ times. Band color _yellow____   Sword **Start with thumb up  On back, knees bent, feet flat, left hand on left hip, right hand above left. Pull right arm DIAGONALLY (hip to shoulder) across chest. Bring right arm along head toward floor. Hold momentarily. Slowly return to starting position. Repeat _5-10__ times. Do with left arm. Band color _yellow_____   Shoulder Rotation: Double Arm   On back, knees bent, feet flat, elbows tucked at sides, bent 90, hands palms up. Pull hands apart and down toward floor, keeping elbows near sides. Hold momentarily. Slowly return to starting position. Repeat _5-10__ times. Band color __yellow____

## 2021-02-10 ENCOUNTER — Encounter: Payer: Self-pay | Admitting: Internal Medicine

## 2021-02-10 NOTE — Assessment & Plan Note (Signed)
Rare clonazepam for anxiety prn.

## 2021-02-10 NOTE — Assessment & Plan Note (Signed)
Flu shot up to date. Covid-19 booster counseled. Pneumonia counseled to consider. Shingrix up to date. Tetanus up to date. Colonoscopy up to date. Mammogram up to date, pap smear up to date. Counseled about sun safety and mole surveillance. Counseled about the dangers of distracted driving. Given 10 year screening recommendations.

## 2021-02-10 NOTE — Assessment & Plan Note (Signed)
Overall improved with weight loss.

## 2021-02-20 ENCOUNTER — Encounter: Payer: Self-pay | Admitting: Physical Therapy

## 2021-02-20 ENCOUNTER — Ambulatory Visit: Payer: 59 | Admitting: Physical Therapy

## 2021-02-20 ENCOUNTER — Encounter: Payer: Self-pay | Admitting: Internal Medicine

## 2021-02-20 ENCOUNTER — Other Ambulatory Visit: Payer: Self-pay

## 2021-02-20 DIAGNOSIS — M6281 Muscle weakness (generalized): Secondary | ICD-10-CM | POA: Diagnosis not present

## 2021-02-20 DIAGNOSIS — C50411 Malignant neoplasm of upper-outer quadrant of right female breast: Secondary | ICD-10-CM | POA: Diagnosis not present

## 2021-02-20 DIAGNOSIS — R293 Abnormal posture: Secondary | ICD-10-CM | POA: Diagnosis not present

## 2021-02-20 DIAGNOSIS — M25521 Pain in right elbow: Secondary | ICD-10-CM | POA: Diagnosis not present

## 2021-02-20 DIAGNOSIS — M25611 Stiffness of right shoulder, not elsewhere classified: Secondary | ICD-10-CM

## 2021-02-20 DIAGNOSIS — Z170 Estrogen receptor positive status [ER+]: Secondary | ICD-10-CM | POA: Diagnosis not present

## 2021-02-20 DIAGNOSIS — R600 Localized edema: Secondary | ICD-10-CM | POA: Diagnosis not present

## 2021-02-20 DIAGNOSIS — R200 Anesthesia of skin: Secondary | ICD-10-CM

## 2021-02-20 DIAGNOSIS — Z483 Aftercare following surgery for neoplasm: Secondary | ICD-10-CM | POA: Diagnosis not present

## 2021-02-20 NOTE — Therapy (Signed)
[facility] @ [facility] [place], [place], [address] Phone: [phone]   Fax:  [phone]  Physical Therapy Treatment  Patient Details  Name: Donna Robbins MRN: [id number] Date of Birth: February 26, 1971 Referring Provider (PT): Dr. Coralie Keens   Encounter Date: 02/20/2021   PT End of Session - 02/20/21 1104     Visit Number 10    Number of Visits 18    Date for PT Re-Evaluation 03/20/21    PT Start Time 1011   pt arrived late   PT Stop Time 1059    PT Time Calculation (min) 48 min    Activity Tolerance Patient tolerated treatment well    Behavior During Therapy [facility] for tasks assessed/performed             Past Medical History:  Diagnosis Date   Allergy    Anxiety    Breast cancer ([place])    Depression    Hx of adenomatous polyp of colon 07/18/2016   Irritable bowel syndrome    OBESITY     Past Surgical History:  Procedure Laterality Date   CESAREAN SECTION     x's 2   CHOLECYSTECTOMY  2009   ENT surgery  2004   MASTECTOMY W/ SENTINEL NODE BIOPSY Right 12/13/2020   Procedure: RIGHT MASTECTOMY WITH SENTINEL LYMPH NODE BIOPSY;  Surgeon: Coralie Keens, MD;  Location: [facility];  Service: General;  Laterality: Right;    There were no vitals filed for this visit.   Subjective Assessment - 02/20/21 1011     Subjective It is a tug of war between compression and constriction. If I put on compression it hurts. If I don't put on compression the fluid goes in to my back. I am having difficulty finding clothes and undergarments I can wear for work.    Pertinent History Patient was diagnosed on 10/24/2020 with right grade I invasive ductal carcinoma breast cancer. She underwent a right mastectomy and sentinel node bipsy (5 negative nodes) on 9/21/2022It is ER/PR positive and HER2 negative with a Ki67 of 1%.    Patient Stated Goals See if we can improve my swelling and scar tissue    Currently in Pain? No/denies     Pain Score 0-No pain                               [facility] Adult PT Treatment/Exercise - 02/20/21 0001       Manual Therapy   Manual Therapy Myofascial release;Soft tissue mobilization    Soft tissue mobilization to R pec to help decrease sensitivity    Myofascial Release To Rt axilla and into medial upper arm where multiple cords palpable,                          PT Long Term Goals - 02/20/21 1020       PT LONG TERM GOAL #1   Title Patient will demonstrate she has regained full shoulder ROM and function post operatively compared ot baselines.    Baseline full motion but does not have quite normal function compared to pre-surgery.    Time 4    Period Weeks    Status Achieved      PT LONG TERM GOAL #2   Title Patient will increase right shoulder flexion AROM to be >/= 160 degrees for increased ease reaching.    Baseline 133  post op; 163 pre-op    Time 4    Period Weeks    Status Achieved      PT LONG TERM GOAL #3   Title Patient will increase right shoulder active abduction to >/= 160 degrees for increased ease reaching and tolerating daily tasks.    Baseline 143 post op; 167 pre-op4    Time 4    Period Weeks    Status Achieved      PT LONG TERM GOAL #4   Title Patient will improve her DASH score to be </= 16 for improved overall UE function.    Baseline 15.91 pre op and 25 post op; 02/20/21-    Time 4      PT LONG TERM GOAL #5   Title Patient will report >/= 25% improvement in left upper quadrant swelling.    Time 4    Period Weeks    Status Achieved      Additional Long Term Goals   Additional Long Term Goals Yes      PT LONG TERM GOAL #6   Title pt will be progressed and independent with  right UE strengthening,.    Time 3    Period Weeks    Status On-going      PT LONG TERM GOAL #7   Title Pt will have less sensitivity across R chest to allow pt to wear prosthetic without discomfort    Time 4    Period Weeks     Status New    Target Date 03/20/21                   Plan - 02/20/21 1106     Clinical Impression Statement Assessed pt's progress towards goals in therapy today. Pt still having cold feeling down her RUE when wearing any type of compression bra. She is still awaiting arrival of her compression tank top. She also has deep senstivity in her R chest with pressure. Educated pt to try to do some soft tissue mobilization to R pec to decrease sensitivity. Educated pt that the cording may be causing the edema and altered sensation in her arm. After focusing on myofascial release to cording today pt did not have as much of a cold feeling in her arm and felt improved motion. She would benefit from continued skilled PT services to decrease R chest sensitivity so she can wear a prosthetic and to continue to instruct pt in R shoulder strengthening exercises to decrease pain. Educated pt to follow up with doctor regarding CT scan for parathesias in her scalp and ear since this should not be related to her surgery.    PT Frequency 2x / week    PT Duration 4 weeks    PT Treatment/Interventions ADLs/Self Care Home Management;Therapeutic exercise;Patient/family education;Manual techniques;Manual lymph drainage;Passive range of motion;Scar mobilization;Vasopneumatic Device    PT Next Visit Plan did she get compression cami ? MFR to Right axilla/UE, continue MLD and Self MLD to right upper quadrant. Pt to tape at home,    PT Home Exercise Plan Post op shoulder HEP and closed chain flexion and abduction, supine scap series, standing SR, ext    Consulted and Agree with Plan of Care Patient             Patient will benefit from skilled therapeutic intervention in order to improve the following deficits and impairments:  Postural dysfunction, Decreased range of motion, Decreased knowledge of precautions, Impaired UE functional use, Pain, Increased edema, Decreased  scar mobility, Increased fascial  restricitons  Visit Diagnosis: Aftercare following surgery for neoplasm  Localized edema  Stiffness of right shoulder, not elsewhere classified  Abnormal posture  Muscle weakness (generalized)  Malignant neoplasm of upper-outer quadrant of right breast in female, estrogen receptor positive [facility])     Problem List Patient Active Problem List   Diagnosis Date Noted   S/P mastectomy, right 12/13/2020   Malignant neoplasm of upper-outer quadrant of right breast in female, estrogen receptor positive ([place]) 11/13/2020   Chronic pain of left knee 04/15/2017   Hx of adenomatous polyp of colon 07/18/2016   Ventral hernia 11/15/2015   Routine general medical examination at a health care facility 11/24/2014   Anxiety    HEARING LOSS 03/14/2010   Obesity 03/06/2010   IRRITABLE BOWEL SYNDROME 03/06/2010    Allyson Sabal [place], PT 02/20/2021, 12:11 PM  [facility] @ [facility] [place], [place], [address] Phone: [phone]   Fax:  [phone]  Name: Donna Robbins MRN: [id number] Date of Birth: Jun 25, 1970   Manus Gunning, PT 02/20/21 12:12 PM

## 2021-02-21 ENCOUNTER — Inpatient Hospital Stay: Payer: 59 | Attending: Oncology | Admitting: Oncology

## 2021-02-21 DIAGNOSIS — Z9011 Acquired absence of right breast and nipple: Secondary | ICD-10-CM | POA: Insufficient documentation

## 2021-02-21 DIAGNOSIS — C50911 Malignant neoplasm of unspecified site of right female breast: Secondary | ICD-10-CM | POA: Diagnosis not present

## 2021-02-21 DIAGNOSIS — F419 Anxiety disorder, unspecified: Secondary | ICD-10-CM | POA: Diagnosis not present

## 2021-02-21 DIAGNOSIS — Z170 Estrogen receptor positive status [ER+]: Secondary | ICD-10-CM | POA: Diagnosis not present

## 2021-02-21 DIAGNOSIS — Z79899 Other long term (current) drug therapy: Secondary | ICD-10-CM | POA: Diagnosis not present

## 2021-02-21 DIAGNOSIS — C50411 Malignant neoplasm of upper-outer quadrant of right female breast: Secondary | ICD-10-CM

## 2021-02-21 DIAGNOSIS — F32A Depression, unspecified: Secondary | ICD-10-CM | POA: Diagnosis not present

## 2021-02-21 DIAGNOSIS — Z79810 Long term (current) use of selective estrogen receptor modulators (SERMs): Secondary | ICD-10-CM | POA: Insufficient documentation

## 2021-02-21 NOTE — Progress Notes (Signed)
[name]  [phone] [phone]     ID: [name] DOB: May 16, 1970  MR#: [id number]  JYN#:[id number]  Patient Care Team: Hoyt Koch, MD as PCP - General (Internal Medicine) Servando Salina, MD as Consulting Physician (Obstetrics and Gynecology) Thurman Coyer, DO as Consulting Physician (Family Medicine) Gatha Mayer, MD (Gastroenterology) Coralie Keens, MD as Consulting Physician (General Surgery) Avangeline Stockburger, Virgie Dad, MD as Consulting Physician (Oncology) Gery Pray, MD as Consulting Physician (Radiation Oncology) Rockwell Germany, RN as Oncology Nurse Navigator Mauro Kaufmann, RN as Oncology Nurse Navigator Gregor Hams, MD as Consulting Physician (Family Medicine) Rozetta Nunnery, MD as Consulting Physician (Otolaryngology) Chauncey Cruel, MD OTHER MD:  I connected with [name] on 02/21/21 at  4:00 PM EST by telephone visit and verified that I am speaking with the correct person using two identifiers.   I discussed the limitations, risks, security and privacy concerns of performing an evaluation and management service by telemedicine and the availability of in-person appointments. I also discussed with the patient that there may be a patient responsible charge related to this service. The patient expressed understanding and agreed to proceed.   Other persons participating in the visit and their role in the encounter: None  Patient's location: Home Provider's location: Clinic  CHIEF COMPLAINT: Estrogen receptor positive breast cancer (s/p right mastectomy)  CURRENT TREATMENT: To start anastrozole in 01/23/2021   INTERVAL HISTORY: Donna Robbins was contacted today for follow up of her estrogen receptor positive breast cancer. She was evaluated in the multidisciplinary breast cancer clinic on 11/15/2020.   She underwent right mastectomy on 12/13/2020 under Dr. Ninfa Linden. Pathology from the  procedure [phone]) showed: invasive and in situ ductal carcinoma, 0.9 cm, grade 1; margins not involved.  All 5 biopsied lymph nodes were negative for carcinoma (0/5).  Oncotype DX was obtained on the final surgical sample and the recurrence score of 8 predicts a risk of recurrence outside the breast over the next 9 years of 3%, if the patient's only systemic therapy is an antiestrogen for 5 years.  It also predicts no benefit from chemotherapy.   She was started on tamoxifen 01/24/2021.  She says she just forgot to start the day before which is interesting.  It does mean she was not all that worried about it, always a good sign.  So far she has had no obvious side effects from the medication in particular no problems with hot flashes or vaginal wetness.  She has had some odd feelings and not feeling particularly good about 14 hours after taking the pill.  She is changing the time that she takes the pill and that seems to be taking care of most of that problem.   REVIEW OF SYSTEMS: Donna Robbins continues to have some cording and lymphedema issues and some range of motion issues and she is continuing to work through those with physical therapy.  She is walking for exercise, 30 to 45 minutes almost every day.  She is lost a little bit of weight which pleases her.  A detailed review of systems today was otherwise stable.  COVID 19 VACCINATION STATUS: [name] x3, most recently 02/2020   HISTORY OF CURRENT ILLNESS: From the original intake note:  Donna Robbins had routine screening mammography on 10/24/2020 showing a possible abnormality in the right breast. She underwent right diagnostic mammography with tomography and right breast ultrasonography at [facility] on 11/06/2020 showing: breast density category B; 0.8 cm irregular  mass in right breast at 9 o'clock; no right axillary lymphadenopathy.  Accordingly on 11/08/2020 she proceeded to biopsy of the right breast area in question. The pathology from this  procedure ([id number]) showed: invasive ductal carcinoma, grade 1/2; ductal carcinoma in situ. Prognostic indicators significant for: estrogen receptor, 100% positive and progesterone receptor, 100% positive, both with strong staining intensity. Proliferation marker Ki67 at 1%. HER2 negative by immunohistochemistry (0).   Cancer Staging  Malignant neoplasm of upper-outer quadrant of right breast in female, estrogen receptor positive (H[facility]) Staging form: Breast, AJCC 8th Edition - Clinical stage from 11/15/2020: Stage IA (cT1b, cN0, cM0, G1, ER+, PR+, HER2-) - Signed by Lowella Dell, MD on 11/15/2020 Stage prefix: Initial diagnosis Histologic grading system: 3 grade system  The patient's subsequent history is as detailed below.   PAST MEDICAL HISTORY: Past Medical History:  Diagnosis Date   Allergy    Anxiety    Breast cancer ([facility])    Depression    Hx of adenomatous polyp of colon 07/18/2016   Irritable bowel syndrome    OBESITY     PAST SURGICAL HISTORY: Past Surgical History:  Procedure Laterality Date   CESAREAN SECTION     x's 2   CHOLECYSTECTOMY  2009   ENT surgery  2004   MASTECTOMY W/ SENTINEL NODE BIOPSY Right 12/13/2020   Procedure: RIGHT MASTECTOMY WITH SENTINEL LYMPH NODE BIOPSY;  Surgeon: Abigail Miyamoto, MD;  Location: [facility];  Service: General;  Laterality: Right;    FAMILY HISTORY: Family History  Problem Relation Age of Onset   Diabetes Mother    Hyperlipidemia Mother    Cancer Father        Angio sarcoma   Hyperlipidemia Father    Arthritis Maternal Grandmother    Lung cancer Maternal Grandmother    Lung cancer Maternal Grandfather    Lung cancer Paternal Grandmother    Arthritis Other    Colon cancer Neg Hx    Stomach cancer Neg Hx    Rectal cancer Neg Hx    Esophageal cancer Neg Hx    Liver cancer Neg Hx    Her father died at age 14 of angiosarcoma. This was secondary to Agent Orange exposure and was diagnosed at age 79. Her mother is 45 years  old, as of 10/2020. She has a history of melanoma of the eye, diagnosed at age 48. Donna Robbins has two brothers (and no sisters). In addition to her parents, she reports lung cancer in both maternal grandparents and a maternal aunt (all were smokers).   GYNECOLOGIC HISTORY:  No LMP recorded. Menarche: 50 years old Age at first live birth: 50 years old GX P 2 LMP 11/09/2020 Contraceptive: used from age 59 to 72 HRT n/a  Hysterectomy? no BSO? no   SOCIAL HISTORY: (updated 10/2020)  Victorino Dike "Donna Robbins" is currently working as an Production designer, theatre/television/film with [employer]. Husband Viviann Spare is an Animal nutritionist in estate planning. She lives at home with Viviann Spare and their two children, Jonne Ply, age 33, and Aiden, age 55. She is not a Actor.    ADVANCED DIRECTIVES: in place   HEALTH MAINTENANCE: Social History   Tobacco Use   Smoking status: Never   Smokeless tobacco: Never  Substance Use Topics   Alcohol use: Yes    Alcohol/week: 2.0 standard drinks    Types: 2 Glasses of wine per week    Comment: Rarely   Drug use: Yes    Types: Marijuana    Comment: once a month  Colonoscopy: 06/2016 (Dr. Carlean Purl), recall 2023  PAP: fall 2021  Bone density: never done   Allergies  Allergen Reactions   Iodine Itching    REACTION: Itching   Sulfonamide Derivatives Itching and Rash    REACTION: Itching Rash    Current Outpatient Medications  Medication Sig Dispense Refill   acetaminophen (TYLENOL) 500 MG tablet Take 1,000 mg by mouth 2 (two) times daily as needed (joint/muscle pain.).     cholecalciferol (VITAMIN D) 1000 units tablet Take 1,000 Units by mouth in the morning.     clonazePAM (KLONOPIN) 0.5 MG tablet TAKE 1/2 TO 1 TABLET BY MOUTH DAILY AS NEEDED FOR ANXIETY (Patient taking differently: Take 0.25 mg by mouth daily as needed for anxiety.) 30 tablet 2   Multiple Vitamin (MULTIVITAMIN WITH MINERALS) TABS tablet Take 1 tablet by mouth in the morning.     nitroGLYCERIN (NITRODUR  - DOSED IN MG/24 HR) 0.2 mg/hr patch APPLY 1/4 PATCH DAILY TO TENDON FOR TENDONITIS. 30 patch 1   tamoxifen (NOLVADEX) 20 MG tablet Take 1 tablet by mouth daily. 90 tablet 4   vitamin B-12 (CYANOCOBALAMIN) 1000 MCG tablet Take 1,000 mcg by mouth in the morning.     No current facility-administered medications for this visit.    OBJECTIVE:   There were no vitals filed for this visit.     There is no height or weight on file to calculate BMI.   Wt Readings from Last 3 Encounters:  02/07/21 171 lb 3.2 oz (77.7 kg)  01/08/21 182 lb 14.4 oz (83 kg)  12/13/20 190 lb (86.2 kg)     ECOG FS:1 - Symptomatic but completely ambulatory    Right chest wall drain area 01/08/2021    LAB RESULTS:  CMP     Component Value Date/Time   NA 140 01/08/2021 1533   K 4.1 01/08/2021 1533   CL 104 01/08/2021 1533   CO2 26 01/08/2021 1533   GLUCOSE 86 01/08/2021 1533   BUN 7 01/08/2021 1533   CREATININE 0.67 01/08/2021 1533   CALCIUM 9.0 01/08/2021 1533   PROT 6.9 01/08/2021 1533   ALBUMIN 3.7 01/08/2021 1533   AST 17 01/08/2021 1533   ALT 15 01/08/2021 1533   ALKPHOS 52 01/08/2021 1533   BILITOT 0.3 01/08/2021 1533   GFRNONAA >60 01/08/2021 1533   GFRAA  05/23/2010 2050    >60        The eGFR has been calculated using the MDRD equation. This calculation has not been validated in all clinical situations. eGFR's persistently <60 mL/min signify possible Chronic Kidney Disease.    No results found for: TOTALPROTELP, ALBUMINELP, A1GS, A2GS, BETS, BETA2SER, GAMS, MSPIKE, SPEI  Lab Results  Component Value Date   WBC 8.5 01/08/2021   NEUTROABS 4.9 01/08/2021   HGB 12.3 01/08/2021   HCT 37.4 01/08/2021   MCV 97.7 01/08/2021   PLT 289 01/08/2021    No results found for: LABCA2  No components found for: [id number]  No results for input(s): INR in the last 168 hours.  No results found for: LABCA2  No results found for: NWG956  No results found for: OZH086  No results found  for: VHQ469  No results found for: CA2729  No components found for: HGQUANT  No results found for: CEA1 / No results found for: CEA1   No results found for: AFPTUMOR  No results found for: CHROMOGRNA  No results found for: KPAFRELGTCHN, LAMBDASER, KAPLAMBRATIO (kappa/lambda light chains)  No results found for: HGBA,  HGBA2QUANT, HGBFQUANT, HGBSQUAN (Hemoglobinopathy evaluation)   No results found for: LDH  No results found for: IRON, TIBC, IRONPCTSAT (Iron and TIBC)  Lab Results  Component Value Date   FERRITIN 39.8 08/01/2017    Urinalysis    Component Value Date/Time   COLORURINE YELLOW 08/13/2013 [place] 08/13/2013 1007   LABSPEC <=1.005 (A) 08/13/2013 1007   PHURINE 7.0 08/13/2013 1007   GLUCOSEU NEGATIVE 08/13/2013 1007   HGBUR NEGATIVE 08/13/2013 1007   BILIRUBINUR NEGATIVE 08/13/2013 1007   BILIRUBINUR neg 07/13/2011 0939   KETONESUR NEGATIVE 08/13/2013 1007   PROTEINUR neg 07/13/2011 0939   PROTEINUR NEGATIVE 05/23/2010 2233   UROBILINOGEN 0.2 08/13/2013 1007   NITRITE NEGATIVE 08/13/2013 1007   LEUKOCYTESUR NEGATIVE 08/13/2013 1007    STUDIES: No results found.   ELIGIBLE FOR AVAILABLE RESEARCH PROTOCOL: no  ASSESSMENT: 50 y.o. [place] woman status post right breast upper outer quadrant biopsy 11/08/2020 for a clinical T1b N0, stage IA invasive ductal carcinoma, low-to intermediate grade, estrogen and progesterone receptor positive, HER2 not amplified, with an MIB-1 of 1%  (1) right mastectomy on 12/13/2020 confirmed a pT1b pN0, stage IA invasive ductal carcinoma, grade 1, with negative margins. (a) a total of 5 right axillary lymph nodes were removed  (2) Oncotype score of 8 predicts a risk of recurrence outside the breast within the next 9 years of 3% if the patient's only systemic therapy is antiestrogens for 5 years.  It also predicts no significant benefit from chemotherapy.  (3) adjuvant radiation not indicated  (4)  started tamoxifen 01/23/2021   PLAN:   Donna Robbins did well with local treatment for her breast cancer and she is tolerating tamoxifen without any obvious side effects so far.  Sometimes it takes 2 or 3 months of her side effects to develop and I have asked her to contact us with any questions or concerns but at this point both she and I are very pleased with the way this adjuvant therapy is going.  She is going to see [place] in mid February.  At that time we will set her up for repeat mammography.  I commended her excellent exercise program.  Total encounter time 20 minutes.Sarajane Jews C. Agron Swiney, MD 02/21/2021 4:02 PM Medical Oncology and Hematology [facility] [place], [place] [address] Tel. [phone]    Fax. [phone]   This document serves as a record of services personally performed by Lurline Del, MD. It was created on his behalf by Wilburn Mylar, a trained medical scribe. The creation of this record is based on the scribe's personal observations and the provider's statements to them.   I, Lurline Del MD, have reviewed the above documentation for accuracy and completeness, and I agree with the above.   *Total Encounter Time as defined by the Centers for Medicare and Medicaid Services includes, in addition to the face-to-face time of a patient visit (documented in the note above) non-face-to-face time: obtaining and reviewing outside history, ordering and reviewing medications, tests or procedures, care coordination (communications with other health care professionals or caregivers) and documentation in the medical record.

## 2021-02-22 ENCOUNTER — Ambulatory Visit: Payer: 59 | Attending: Surgery

## 2021-02-22 ENCOUNTER — Other Ambulatory Visit: Payer: Self-pay

## 2021-02-22 DIAGNOSIS — M25611 Stiffness of right shoulder, not elsewhere classified: Secondary | ICD-10-CM | POA: Diagnosis not present

## 2021-02-22 DIAGNOSIS — M6281 Muscle weakness (generalized): Secondary | ICD-10-CM

## 2021-02-22 DIAGNOSIS — Z483 Aftercare following surgery for neoplasm: Secondary | ICD-10-CM

## 2021-02-22 DIAGNOSIS — C50411 Malignant neoplasm of upper-outer quadrant of right female breast: Secondary | ICD-10-CM | POA: Diagnosis not present

## 2021-02-22 DIAGNOSIS — R293 Abnormal posture: Secondary | ICD-10-CM

## 2021-02-22 DIAGNOSIS — R600 Localized edema: Secondary | ICD-10-CM

## 2021-02-22 DIAGNOSIS — Z170 Estrogen receptor positive status [ER+]: Secondary | ICD-10-CM

## 2021-02-22 NOTE — Therapy (Signed)
[facility] @ [place], [place], [address] Phone: [phone]   Fax:  [phone]  Physical Therapy Treatment  Patient Details  Name: Donna Robbins MRN: [id number] Date of Birth: 1971/01/30 Referring Provider (PT): Dr. Coralie Keens   Encounter Date: 02/22/2021   PT End of Session - 02/22/21 1212     Visit Number 11    Number of Visits 18    Date for PT Re-Evaluation 03/20/21    PT Start Time 1107    PT Stop Time 1208    PT Time Calculation (min) 61 min    Activity Tolerance Patient tolerated treatment well    Behavior During Therapy [facility] for tasks assessed/performed             Past Medical History:  Diagnosis Date   Allergy    Anxiety    Breast cancer ([place])    Depression    Hx of adenomatous polyp of colon 07/18/2016   Irritable bowel syndrome    OBESITY     Past Surgical History:  Procedure Laterality Date   CESAREAN SECTION     x's 2   CHOLECYSTECTOMY  2009   ENT surgery  2004   MASTECTOMY W/ SENTINEL NODE BIOPSY Right 12/13/2020   Procedure: RIGHT MASTECTOMY WITH SENTINEL LYMPH NODE BIOPSY;  Surgeon: Coralie Keens, MD;  Location: [place];  Service: General;  Laterality: Right;    There were no vitals filed for this visit.   Subjective Assessment - 02/22/21 1106     Subjective I've been having some Rt sided facial sensitivity that started after surgery that just hasn't gone away. I feel it mostly from the center of my ear to my cheek bone. Dr. Jana Hakim scheduled a head/neck CT scan but we don't think it's going to show anything because the weakness isn't consistent or progressing like a tumor would cause so I don't know. The swelling at my Rt axilla seems to marginally improve when I do the self MLD but it's always there and always comes back. The tight compression bra makes my Rt UE tingling worse and I dread wearing it for that but it does help reduce the edema.    Pertinent  History Patient was diagnosed on 10/24/2020 with right grade I invasive ductal carcinoma breast cancer. She underwent a right mastectomy and sentinel node bipsy (5 negative nodes) on 9/21/2022It is ER/PR positive and HER2 negative with a Ki67 of 1%.    Patient Stated Goals See if we can improve my swelling and scar tissue    Currently in Pain? No/denies                               [facility] Adult PT Treatment/Exercise - 02/22/21 0001       Manual Therapy   Manual Therapy Myofascial release;Soft tissue mobilization;Manual Lymphatic Drainage (MLD)    Soft tissue mobilization to R pec at palpable area of tightness    Myofascial Release To Rt axilla and into medial upper arm where multiple cords palpable but ths does seem to be improving    Manual Lymphatic Drainage (MLD) Short neck, superficial and deep abdominals, Lt axillary nodes, Lt intact upper quadrant sequence, anterior interaxillary pathway and, Rt inguinal nodes and Rt axillo-inguinal pathway and then Rt chest towards pathways and then in Lt sidelying for posterior interaxillary work and further focus to lateral trunk edema, then finished retracing all steps  in supine reviewing with pt throughout correct technique for skin strtch as she was sliding    Passive ROM In Supine into Rt shoulder flexion and abd for access to cording during MFR                          PT Long Term Goals - 02/20/21 1020       PT LONG TERM GOAL #1   Title Patient will demonstrate she has regained full shoulder ROM and function post operatively compared ot baselines.    Baseline full motion but does not have quite normal function compared to pre-surgery.    Time 4    Period Weeks    Status Achieved      PT LONG TERM GOAL #2   Title Patient will increase right shoulder flexion AROM to be >/= 160 degrees for increased ease reaching.    Baseline 133 post op; 163 pre-op    Time 4    Period Weeks    Status Achieved      PT  LONG TERM GOAL #3   Title Patient will increase right shoulder active abduction to >/= 160 degrees for increased ease reaching and tolerating daily tasks.    Baseline 143 post op; 167 pre-op4    Time 4    Period Weeks    Status Achieved      PT LONG TERM GOAL #4   Title Patient will improve her DASH score to be </= 16 for improved overall UE function.    Baseline 15.91 pre op and 25 post op; 02/20/21-    Time 4      PT LONG TERM GOAL #5   Title Patient will report >/= 25% improvement in left upper quadrant swelling.    Time 4    Period Weeks    Status Achieved      Additional Long Term Goals   Additional Long Term Goals Yes      PT LONG TERM GOAL #6   Title pt will be progressed and independent with  right UE strengthening,.    Time 3    Period Weeks    Status On-going      PT LONG TERM GOAL #7   Title Pt will have less sensitivity across R chest to allow pt to wear prosthetic without discomfort    Time 4    Period Weeks    Status New    Target Date 03/20/21                   Plan - 02/22/21 1213     Clinical Impression Statement Spent beginning of session answering pts questions regarding her lateral trunk edema and how this can be slow to soften and resolve. Encouraged her to cont with self MLD and to try this 2x/day for a week to see if she can get longer lasting results as her edema does improve during session with MLD and she reports this also does improve with wear of compression. Though her compression bras have bee increasing her Rt UE tingling. Assessed what she considered to be a lighter copmresion bra but with foam this was actually applying great compression so educated her that the other bra may be too tight which may be part of contributing factor with worsening the tingling. Pt verbalized good understanding and seemed to be encouraged by the fact that she can wear more of what she was thinking was not enough compression. She  did not c/o chest sensitivity  during session today. Pt also felt kinesiotape was helpful but has a massage tomorrow so wants to wait until next session to apply as she will be able to leave this on longer.    Stability/Clinical Decision Making Stable/Uncomplicated    Rehab Potential Excellent    PT Frequency 2x / week    PT Duration 4 weeks    PT Treatment/Interventions ADLs/Self Care Home Management;Therapeutic exercise;Patient/family education;Manual techniques;Manual lymph drainage;Passive range of motion;Scar mobilization;Vasopneumatic Device    PT Next Visit Plan did she get compression cami ? MFR to Right axilla/UE cording, continue MLD and Self MLD to right upper quadrant. Resume kinesiotape educating pt in this when applying next    PT Home Exercise Plan Post op shoulder HEP and closed chain flexion and abduction, supine scap series, standing SR, ext; self MLD 2x/day    Consulted and Agree with Plan of Care Patient             Patient will benefit from skilled therapeutic intervention in order to improve the following deficits and impairments:  Postural dysfunction, Decreased range of motion, Decreased knowledge of precautions, Impaired UE functional use, Pain, Increased edema, Decreased scar mobility, Increased fascial restricitons  Visit Diagnosis: Aftercare following surgery for neoplasm  Localized edema  Stiffness of right shoulder, not elsewhere classified  Abnormal posture  Muscle weakness (generalized)  Malignant neoplasm of upper-outer quadrant of right breast in female, estrogen receptor positive ([place])     Problem List Patient Active Problem List   Diagnosis Date Noted   S/P mastectomy, right 12/13/2020   Malignant neoplasm of upper-outer quadrant of right breast in female, estrogen receptor positive ([place]) 11/13/2020   Chronic pain of left knee 04/15/2017   Hx of adenomatous polyp of colon 07/18/2016   Ventral hernia 11/15/2015   Routine general medical examination at a health care  facility 11/24/2014   Anxiety    HEARING LOSS 03/14/2010   Obesity 03/06/2010   IRRITABLE BOWEL SYNDROME 03/06/2010    Otelia Limes, PTA 02/22/2021, 12:21 PM  [facility] @ [place], [place], [address] Phone: [phone]   Fax:  [phone]  Name: RILYN SCROGGS MRN: [id number] Date of Birth: 1971-03-11

## 2021-02-26 ENCOUNTER — Other Ambulatory Visit (HOSPITAL_COMMUNITY): Payer: Self-pay

## 2021-02-26 ENCOUNTER — Telehealth: Payer: Self-pay | Admitting: *Deleted

## 2021-02-26 NOTE — Telephone Encounter (Signed)
Pt states onset of bilateral periorbital swelling upon waking this Saturday- then today she has had upper lip swelling.  She thought the eye swelling may have from [place] food she ate Friday pm - but then continued. She noted the upper lip swelling post taking the tamoxifen.  Of note she started the tamoxifen approximately 1 month and is concerned if it is the cause.  Donna Robbins denies any SOB or throat discomfort.  Presently Donna Robbins will hold the tamoxifen and monitor symptoms -  She will call this Thursday with up date.  If symptoms have resolved she is willing to restart and monitor for recurrence of issues.  No other needs at this time.

## 2021-02-27 ENCOUNTER — Other Ambulatory Visit: Payer: Self-pay

## 2021-02-27 ENCOUNTER — Ambulatory Visit: Payer: 59

## 2021-02-27 DIAGNOSIS — Z170 Estrogen receptor positive status [ER+]: Secondary | ICD-10-CM

## 2021-02-27 DIAGNOSIS — M6281 Muscle weakness (generalized): Secondary | ICD-10-CM

## 2021-02-27 DIAGNOSIS — R600 Localized edema: Secondary | ICD-10-CM | POA: Diagnosis not present

## 2021-02-27 DIAGNOSIS — Z483 Aftercare following surgery for neoplasm: Secondary | ICD-10-CM | POA: Diagnosis not present

## 2021-02-27 DIAGNOSIS — M25611 Stiffness of right shoulder, not elsewhere classified: Secondary | ICD-10-CM

## 2021-02-27 DIAGNOSIS — R293 Abnormal posture: Secondary | ICD-10-CM | POA: Diagnosis not present

## 2021-02-27 DIAGNOSIS — C50411 Malignant neoplasm of upper-outer quadrant of right female breast: Secondary | ICD-10-CM

## 2021-02-27 NOTE — Therapy (Signed)
[facility] @ [place], [place], [address] Phone: [phone]   Fax:  [phone]  Physical Therapy Treatment  Patient Details  Name: Donna Robbins MRN: [id number] Date of Birth: 1970-05-28 Referring Provider (PT): Dr. Coralie Keens   Encounter Date: 02/27/2021   PT End of Session - 02/27/21 1106     Visit Number 12    Number of Visits 18    Date for PT Re-Evaluation 03/20/21    PT Start Time 1006    PT Stop Time 1102    PT Time Calculation (min) 56 min    Activity Tolerance Patient tolerated treatment well    Behavior During Therapy [facility] for tasks assessed/performed             Past Medical History:  Diagnosis Date   Allergy    Anxiety    Breast cancer ([place])    Depression    Hx of adenomatous polyp of colon 07/18/2016   Irritable bowel syndrome    OBESITY     Past Surgical History:  Procedure Laterality Date   CESAREAN SECTION     x's 2   CHOLECYSTECTOMY  2009   ENT surgery  2004   MASTECTOMY W/ SENTINEL NODE BIOPSY Right 12/13/2020   Procedure: RIGHT MASTECTOMY WITH SENTINEL LYMPH NODE BIOPSY;  Surgeon: Coralie Keens, MD;  Location: [facility];  Service: General;  Laterality: Right;    There were no vitals filed for this visit.   Subjective Assessment - 02/27/21 1011     Subjective My Rt arm is significantly better when I give it a bra break or wear one that is much more comfortable. The swelling is much better now so I think the only symptom I'm dealing with now is nerve related from the surgery.    Pertinent History Patient was diagnosed on 10/24/2020 with right grade I invasive ductal carcinoma breast cancer. She underwent a right mastectomy and sentinel node bipsy (5 negative nodes) on 9/21/2022It is ER/PR positive and HER2 negative with a Ki67 of 1%.    Patient Stated Goals See if we can improve my swelling and scar tissue    Currently in Pain? No/denies                                [facility] Adult PT Treatment/Exercise - 02/27/21 0001       Manual Therapy   Manual Therapy Myofascial release;Soft tissue mobilization;Manual Lymphatic Drainage (MLD);Taping    Soft tissue mobilization to R pec at palpable area of tightness    Myofascial Release To Rt axilla and into medial upper arm where one main cord palpable now and a few very mild ones, this overall much improved    Manual Lymphatic Drainage (MLD) Short neck, superficial and deep abdominals, Lt axillary nodes, Lt intact upper quadrant sequence, anterior interaxillary pathway and, Rt inguinal nodes and Rt axillo-inguinal pathway and then Rt chest towards pathways and then in Lt sidelying for posterior interaxillary work and further focus to lateral trunk edema, then finished retracing all steps in supine reviewing with pt throughout correct technique for skin strtch as she was sliding    Passive ROM In Supine into Rt shoulder flexion and abd for access to cording during MFR      Kinesiotix   Edema Kinesiotape along Rt axillo-inguinal anastomosis with 2 fingers running anterior and posterio to her drain incision and giong to the  inferior aspect of her mastectomy incision educating her on applying this while performing and issued second piece for her to try                          PT Long Term Goals - 02/20/21 1020       PT LONG TERM GOAL #1   Title Patient will demonstrate she has regained full shoulder ROM and function post operatively compared ot baselines.    Baseline full motion but does not have quite normal function compared to pre-surgery.    Time 4    Period Weeks    Status Achieved      PT LONG TERM GOAL #2   Title Patient will increase right shoulder flexion AROM to be >/= 160 degrees for increased ease reaching.    Baseline 133 post op; 163 pre-op    Time 4    Period Weeks    Status Achieved      PT LONG TERM GOAL #3   Title Patient will increase  right shoulder active abduction to >/= 160 degrees for increased ease reaching and tolerating daily tasks.    Baseline 143 post op; 167 pre-op4    Time 4    Period Weeks    Status Achieved      PT LONG TERM GOAL #4   Title Patient will improve her DASH score to be </= 16 for improved overall UE function.    Baseline 15.91 pre op and 25 post op; 02/20/21-    Time 4      PT LONG TERM GOAL #5   Title Patient will report >/= 25% improvement in left upper quadrant swelling.    Time 4    Period Weeks    Status Achieved      Additional Long Term Goals   Additional Long Term Goals Yes      PT LONG TERM GOAL #6   Title pt will be progressed and independent with  right UE strengthening,.    Time 3    Period Weeks    Status On-going      PT LONG TERM GOAL #7   Title Pt will have less sensitivity across R chest to allow pt to wear prosthetic without discomfort    Time 4    Period Weeks    Status New    Target Date 03/20/21                   Plan - 02/27/21 1106     Clinical Impression Statement Continued wiht manual therapy focusing on decreasing Rt axillary cording which is much improved overall. Also cont with MLD focusing on Rt lateral trunk edema which has also improved. Second trial of kinesiotape to Rt lateral trunk as pt will be able to leave this on longer today.    Stability/Clinical Decision Making Stable/Uncomplicated    Rehab Potential Excellent    PT Frequency 2x / week    PT Duration 4 weeks    PT Treatment/Interventions ADLs/Self Care Home Management;Therapeutic exercise;Patient/family education;Manual techniques;Manual lymph drainage;Passive range of motion;Scar mobilization;Vasopneumatic Device    PT Next Visit Plan How was kinesiotape? did she get compression cami ? MFR to Right axilla/UE cording, continue MLD and Self MLD to right upper quadrant. Resume kinesiotape educating pt in this when applying next    PT Home Exercise Plan Post op shoulder HEP and  closed chain flexion and abduction, supine scap series, standing SR, ext;  self MLD 2x/day    Consulted and Agree with Plan of Care Patient             Patient will benefit from skilled therapeutic intervention in order to improve the following deficits and impairments:  Postural dysfunction, Decreased range of motion, Decreased knowledge of precautions, Impaired UE functional use, Pain, Increased edema, Decreased scar mobility, Increased fascial restricitons  Visit Diagnosis: Aftercare following surgery for neoplasm  Localized edema  Stiffness of right shoulder, not elsewhere classified  Abnormal posture  Muscle weakness (generalized)  Malignant neoplasm of upper-outer quadrant of right breast in female, estrogen receptor positive ([place])     Problem List Patient Active Problem List   Diagnosis Date Noted   S/P mastectomy, right 12/13/2020   Malignant neoplasm of upper-outer quadrant of right breast in female, estrogen receptor positive ([place]) 11/13/2020   Chronic pain of left knee 04/15/2017   Hx of adenomatous polyp of colon 07/18/2016   Ventral hernia 11/15/2015   Routine general medical examination at a health care facility 11/24/2014   Anxiety    HEARING LOSS 03/14/2010   Obesity 03/06/2010   IRRITABLE BOWEL SYNDROME 03/06/2010    Otelia Limes, PTA 02/27/2021, 11:08 AM  [facility] @ [place], [place], [address] Phone: [phone]   Fax:  [phone]  Name: Donna Robbins MRN: [id number] Date of Birth: [birth year]

## 2021-02-28 DIAGNOSIS — C50911 Malignant neoplasm of unspecified site of right female breast: Secondary | ICD-10-CM | POA: Diagnosis not present

## 2021-03-01 ENCOUNTER — Ambulatory Visit: Payer: 59

## 2021-03-01 ENCOUNTER — Other Ambulatory Visit: Payer: Self-pay

## 2021-03-01 DIAGNOSIS — M25611 Stiffness of right shoulder, not elsewhere classified: Secondary | ICD-10-CM | POA: Diagnosis not present

## 2021-03-01 DIAGNOSIS — R600 Localized edema: Secondary | ICD-10-CM

## 2021-03-01 DIAGNOSIS — Z483 Aftercare following surgery for neoplasm: Secondary | ICD-10-CM

## 2021-03-01 DIAGNOSIS — M6281 Muscle weakness (generalized): Secondary | ICD-10-CM

## 2021-03-01 DIAGNOSIS — C50411 Malignant neoplasm of upper-outer quadrant of right female breast: Secondary | ICD-10-CM | POA: Diagnosis not present

## 2021-03-01 DIAGNOSIS — R293 Abnormal posture: Secondary | ICD-10-CM | POA: Diagnosis not present

## 2021-03-01 DIAGNOSIS — Z170 Estrogen receptor positive status [ER+]: Secondary | ICD-10-CM

## 2021-03-01 NOTE — Therapy (Signed)
[facility] @ [place], [place], [address] Phone: [phone]   Fax:  [phone]  Physical Therapy Treatment  Patient Details  Name: Donna Robbins MRN: [id number] Date of Birth: 1970/08/09 Referring Provider (PT): Dr. Coralie Keens   Encounter Date: 03/01/2021   PT End of Session - 03/01/21 1106     Visit Number 13    Number of Visits 18    Date for PT Re-Evaluation 03/20/21    PT Start Time 1008    PT Stop Time 1105    PT Time Calculation (min) 57 min    Activity Tolerance Patient tolerated treatment well    Behavior During Therapy [facility] for tasks assessed/performed             Past Medical History:  Diagnosis Date   Allergy    Anxiety    Breast cancer ([place])    Depression    Hx of adenomatous polyp of colon 07/18/2016   Irritable bowel syndrome    OBESITY     Past Surgical History:  Procedure Laterality Date   CESAREAN SECTION     x's 2   CHOLECYSTECTOMY  2009   ENT surgery  2004   MASTECTOMY W/ SENTINEL NODE BIOPSY Right 12/13/2020   Procedure: RIGHT MASTECTOMY WITH SENTINEL LYMPH NODE BIOPSY;  Surgeon: Coralie Keens, MD;  Location: [facility];  Service: General;  Laterality: Right;    There were no vitals filed for this visit.   Subjective Assessment - 03/01/21 1009     Subjective The kinesiotape did seem to help the swelling but it made it harder to do the MLD over that. I did finally get a compression cami and I slept in it and it was much more comfortable to not have a band rolling into my ribs. My arm tingling was a little improved as well.    Pertinent History Patient was diagnosed on 10/24/2020 with right grade I invasive ductal carcinoma breast cancer. She underwent a right mastectomy and sentinel node bipsy (5 negative nodes) on 9/21/2022It is ER/PR positive and HER2 negative with a Ki67 of 1%.    Patient Stated Goals See if we can improve my swelling and scar tissue     Currently in Pain? No/denies                               [facility] Adult PT Treatment/Exercise - 03/01/21 0001       Manual Therapy   Soft tissue mobilization --    Myofascial Release To Rt axilla and into medial upper arm where one main cord palpable now and a few very mild ones, this conts to be overall much improved    Manual Lymphatic Drainage (MLD) Short neck, superficial and deep abdominals, Lt axillary nodes, Lt intact upper quadrant sequence, anterior interaxillary pathway and, Rt inguinal nodes and Rt axillo-inguinal pathway and then Rt chest towards pathways and then in Lt sidelying for posterior interaxillary work and further focus to lateral trunk edema, then finished retracing all steps in supine answering pts questions in regards to her technique. She was doing well with skin stretching now but timing was a little fast and she was not stretching skin as far as able. Was able to return much better demo after cuing.    Passive ROM In Supine into Rt shoulder flexion and abd for access to cording during MFR  Kinesiotix   Edema Kinesiotape along Rt axillo-inguinal anastomosis with 2 fingers running both anterior to her drain incision and going to the inferior aspect of her mastectomy incision reviewing applying this while performing                          PT Long Term Goals - 02/20/21 1020       PT LONG TERM GOAL #1   Title Patient will demonstrate she has regained full shoulder ROM and function post operatively compared ot baselines.    Baseline full motion but does not have quite normal function compared to pre-surgery.    Time 4    Period Weeks    Status Achieved      PT LONG TERM GOAL #2   Title Patient will increase right shoulder flexion AROM to be >/= 160 degrees for increased ease reaching.    Baseline 133 post op; 163 pre-op    Time 4    Period Weeks    Status Achieved      PT LONG TERM GOAL #3   Title Patient will increase  right shoulder active abduction to >/= 160 degrees for increased ease reaching and tolerating daily tasks.    Baseline 143 post op; 167 pre-op4    Time 4    Period Weeks    Status Achieved      PT LONG TERM GOAL #4   Title Patient will improve her DASH score to be </= 16 for improved overall UE function.    Baseline 15.91 pre op and 25 post op; 02/20/21-    Time 4      PT LONG TERM GOAL #5   Title Patient will report >/= 25% improvement in left upper quadrant swelling.    Time 4    Period Weeks    Status Achieved      Additional Long Term Goals   Additional Long Term Goals Yes      PT LONG TERM GOAL #6   Title pt will be progressed and independent with  right UE strengthening,.    Time 3    Period Weeks    Status On-going      PT LONG TERM GOAL #7   Title Pt will have less sensitivity across R chest to allow pt to wear prosthetic without discomfort    Time 4    Period Weeks    Status New    Target Date 03/20/21                   Plan - 03/01/21 1109     Clinical Impression Statement Pt comes in reporting overall feeling some good improvements as her lateral trunk swelling is less and with new compression cami her Rt arm tingling is some improved. She also has been off tamoxifen per MD order for past 3 days and also reports noting good improvement with new facial swelling she had experienced over the weekend and general Rt UE swelling seemed improved as well so will speak to her oncologist about other options of taking this medication.    Stability/Clinical Decision Making Stable/Uncomplicated    Rehab Potential Excellent    PT Frequency 2x / week    PT Duration 4 weeks    PT Treatment/Interventions ADLs/Self Care Home Management;Therapeutic exercise;Patient/family education;Manual techniques;Manual lymph drainage;Passive range of motion;Scar mobilization;Vasopneumatic Device    PT Next Visit Plan MFR to Right axilla/UE cording, continue MLD and Self MLD to right  upper quadrant. How is pt doing with HEP?    PT Home Exercise Plan Post op shoulder HEP and closed chain flexion and abduction, supine scap series, standing SR, ext; self MLD 2x/day    Consulted and Agree with Plan of Care Patient             Patient will benefit from skilled therapeutic intervention in order to improve the following deficits and impairments:  Postural dysfunction, Decreased range of motion, Decreased knowledge of precautions, Impaired UE functional use, Pain, Increased edema, Decreased scar mobility, Increased fascial restricitons  Visit Diagnosis: Aftercare following surgery for neoplasm  Localized edema  Stiffness of right shoulder, not elsewhere classified  Abnormal posture  Muscle weakness (generalized)  Malignant neoplasm of upper-outer quadrant of right breast in female, estrogen receptor positive ([place])     Problem List Patient Active Problem List   Diagnosis Date Noted   S/P mastectomy, right 12/13/2020   Malignant neoplasm of upper-outer quadrant of right breast in female, estrogen receptor positive ([place]) 11/13/2020   Chronic pain of left knee 04/15/2017   Hx of adenomatous polyp of colon 07/18/2016   Ventral hernia 11/15/2015   Routine general medical examination at a health care facility 11/24/2014   Anxiety    HEARING LOSS 03/14/2010   Obesity 03/06/2010   IRRITABLE BOWEL SYNDROME 03/06/2010    Otelia Limes, PTA 03/01/2021, 11:12 AM  [facility] @ [place], [place], [address] Phone: [phone]   Fax:  [phone]  Name: Donna Robbins MRN: [id number] Date of Birth: Jan 18, 1971

## 2021-03-05 ENCOUNTER — Encounter: Payer: Self-pay | Admitting: Oncology

## 2021-03-08 ENCOUNTER — Encounter: Payer: 59 | Admitting: Rehabilitation

## 2021-03-09 ENCOUNTER — Ambulatory Visit: Payer: 59

## 2021-03-09 ENCOUNTER — Other Ambulatory Visit: Payer: Self-pay

## 2021-03-09 DIAGNOSIS — Z170 Estrogen receptor positive status [ER+]: Secondary | ICD-10-CM

## 2021-03-09 DIAGNOSIS — M25611 Stiffness of right shoulder, not elsewhere classified: Secondary | ICD-10-CM | POA: Diagnosis not present

## 2021-03-09 DIAGNOSIS — Z483 Aftercare following surgery for neoplasm: Secondary | ICD-10-CM | POA: Diagnosis not present

## 2021-03-09 DIAGNOSIS — R600 Localized edema: Secondary | ICD-10-CM

## 2021-03-09 DIAGNOSIS — C50411 Malignant neoplasm of upper-outer quadrant of right female breast: Secondary | ICD-10-CM | POA: Diagnosis not present

## 2021-03-09 DIAGNOSIS — R293 Abnormal posture: Secondary | ICD-10-CM | POA: Diagnosis not present

## 2021-03-09 DIAGNOSIS — M6281 Muscle weakness (generalized): Secondary | ICD-10-CM

## 2021-03-09 NOTE — Therapy (Addendum)
[facility] @ [place] [address], [place], [address] Phone: [phone]   Fax:  [phone]  Physical Therapy Treatment  Patient Details  Name: Donna Robbins MRN: [id number] Date of Birth: Sep 28, 1970 Referring Provider (PT): Dr. Coralie Keens   Encounter Date: 03/09/2021   PT End of Session - 03/09/21 0956     Visit Number 14    Number of Visits 18    Date for PT Re-Evaluation 03/20/21    PT Start Time 0903    PT Stop Time 0957    PT Time Calculation (min) 54 min    Activity Tolerance Patient tolerated treatment well    Behavior During Therapy [facility] for tasks assessed/performed             Past Medical History:  Diagnosis Date   Allergy    Anxiety    Breast cancer ([place])    Depression    Hx of adenomatous polyp of colon 07/18/2016   Irritable bowel syndrome    OBESITY     Past Surgical History:  Procedure Laterality Date   CESAREAN SECTION     x's 2   CHOLECYSTECTOMY  2009   ENT surgery  2004   MASTECTOMY W/ SENTINEL NODE BIOPSY Right 12/13/2020   Procedure: RIGHT MASTECTOMY WITH SENTINEL LYMPH NODE BIOPSY;  Surgeon: Coralie Keens, MD;  Location: [facility];  Service: General;  Laterality: Right;    There were no vitals filed for this visit.   Subjective Assessment - 03/09/21 0911     Subjective I started back on Tamoxifen this week so I'm waiting to see how that goes with side effects. Rt upper arm feels larger but I can temorarily get it out with self MLD and I can tell walking worsens that some. That's a little newer. There's none in the morning but I'm still doing the MLD first thing. Still struggling with finding comfortable compression.    Pertinent History Patient was diagnosed on 10/24/2020 with right grade I invasive ductal carcinoma breast cancer. She underwent a right mastectomy and sentinel node bipsy (5 negative nodes) on 9/21/2022It is ER/PR positive and HER2 negative with a Ki67 of 1%.     Patient Stated Goals See if we can improve my swelling and scar tissue    Currently in Pain? No/denies                               [facility] Adult PT Treatment/Exercise - 03/09/21 0001       Manual Therapy   Myofascial Release To Rt axilla and into medial upper arm where one main cord palpable now and a few very mild ones, this conts to be overall much improved    Manual Lymphatic Drainage (MLD) Short neck, superficial and deep abdominals, Lt axillary nodes, briefly anterior interaxillary pathway and, Rt inguinal nodes and Rt axillo-inguinal pathway and then Rt chest and upper arm (added this today and instructed pt as well) towards pathways    Passive ROM In Supine into Rt shoulder flexion, abd, and D2 for access to cording during MFR                          PT Long Term Goals - 02/20/21 1020       PT LONG TERM GOAL #1   Title Patient will demonstrate she has regained full shoulder ROM and function post operatively  compared ot baselines.    Baseline full motion but does not have quite normal function compared to pre-surgery.    Time 4    Period Weeks    Status Achieved      PT LONG TERM GOAL #2   Title Patient will increase right shoulder flexion AROM to be >/= 160 degrees for increased ease reaching.    Baseline 133 post op; 163 pre-op    Time 4    Period Weeks    Status Achieved      PT LONG TERM GOAL #3   Title Patient will increase right shoulder active abduction to >/= 160 degrees for increased ease reaching and tolerating daily tasks.    Baseline 143 post op; 167 pre-op4    Time 4    Period Weeks    Status Achieved      PT LONG TERM GOAL #4   Title Patient will improve her DASH score to be </= 16 for improved overall UE function.    Baseline 15.91 pre op and 25 post op; 02/20/21-    Time 4      PT LONG TERM GOAL #5   Title Patient will report >/= 25% improvement in left upper quadrant swelling.    Time 4    Period Weeks     Status Achieved      Additional Long Term Goals   Additional Long Term Goals Yes      PT LONG TERM GOAL #6   Title pt will be progressed and independent with  right UE strengthening,.    Time 3    Period Weeks    Status On-going      PT LONG TERM GOAL #7   Title Pt will have less sensitivity across R chest to allow pt to wear prosthetic without discomfort    Time 4    Period Weeks    Status New    Target Date 03/20/21                   Plan - 03/09/21 0957     Clinical Impression Statement Pt is back on tamoxifen so is being aware of any change in her symptoms this week. Her cording and tightness in axilla seemed much improved today with it being only mildly palpable. Incorporated her Rt upper arm in MLD sequence today as she was c/o possibly feeling fullness here, and instructed her in this having her briefly return demo. She does report that tingling still present in her axilla that comes and goes but doesn't seem to be as bad as it has been.    Stability/Clinical Decision Making Stable/Uncomplicated    Rehab Potential Excellent    PT Frequency 2x / week    PT Duration 4 weeks    PT Treatment/Interventions ADLs/Self Care Home Management;Therapeutic exercise;Patient/family education;Manual techniques;Manual lymph drainage;Passive range of motion;Scar mobilization;Vasopneumatic Device    PT Next Visit Plan Do SOZO next as it is at 3 months post op and pt is c/o Rt upper arm fullness; MFR to Right axilla/UE cording, continue MLD and Self MLD to right upper quadrant. How is pt doing with HEP?    PT Home Exercise Plan Post op shoulder HEP and closed chain flexion and abduction, supine scap series, standing SR, ext; self MLD 2x/day    Consulted and Agree with Plan of Care Patient             Patient will benefit from skilled therapeutic intervention in order to improve  the following deficits and impairments:  Postural dysfunction, Decreased range of motion, Decreased  knowledge of precautions, Impaired UE functional use, Pain, Increased edema, Decreased scar mobility, Increased fascial restricitons  Visit Diagnosis: Aftercare following surgery for neoplasm  Localized edema  Stiffness of right shoulder, not elsewhere classified  Abnormal posture  Muscle weakness (generalized)  Malignant neoplasm of upper-outer quadrant of right breast in female, estrogen receptor positive ([place])     Problem List Patient Active Problem List   Diagnosis Date Noted   S/P mastectomy, right 12/13/2020   Malignant neoplasm of upper-outer quadrant of right breast in female, estrogen receptor positive ([place]) 11/13/2020   Chronic pain of left knee 04/15/2017   Hx of adenomatous polyp of colon 07/18/2016   Ventral hernia 11/15/2015   Routine general medical examination at a health care facility 11/24/2014   Anxiety    HEARING LOSS 03/14/2010   Obesity 03/06/2010   IRRITABLE BOWEL SYNDROME 03/06/2010    Otelia Limes, PTA 03/09/2021, 11:03 AM  [facility] @ [place], [place], [address] Phone: [phone]   Fax:  [phone]  Name: Donna Robbins MRN: [id number] Date of Birth: 11-27-1970

## 2021-03-15 ENCOUNTER — Ambulatory Visit: Payer: 59

## 2021-03-15 ENCOUNTER — Other Ambulatory Visit: Payer: Self-pay

## 2021-03-15 ENCOUNTER — Other Ambulatory Visit (HOSPITAL_COMMUNITY): Payer: Self-pay

## 2021-03-15 DIAGNOSIS — R600 Localized edema: Secondary | ICD-10-CM

## 2021-03-15 DIAGNOSIS — M25611 Stiffness of right shoulder, not elsewhere classified: Secondary | ICD-10-CM | POA: Diagnosis not present

## 2021-03-15 DIAGNOSIS — Z170 Estrogen receptor positive status [ER+]: Secondary | ICD-10-CM | POA: Diagnosis not present

## 2021-03-15 DIAGNOSIS — Z483 Aftercare following surgery for neoplasm: Secondary | ICD-10-CM

## 2021-03-15 DIAGNOSIS — R293 Abnormal posture: Secondary | ICD-10-CM | POA: Diagnosis not present

## 2021-03-15 DIAGNOSIS — C50411 Malignant neoplasm of upper-outer quadrant of right female breast: Secondary | ICD-10-CM | POA: Diagnosis not present

## 2021-03-15 DIAGNOSIS — M6281 Muscle weakness (generalized): Secondary | ICD-10-CM | POA: Diagnosis not present

## 2021-03-15 NOTE — Therapy (Signed)
[facility] @ [place], [place], [address] Phone: [phone]   Fax:  [phone]  Physical Therapy Treatment  Patient Details  Name: Donna Robbins MRN: [id number] Date of Birth: 10-27-1970 Referring Provider (PT): Dr. Coralie Keens   Encounter Date: 03/15/2021   PT End of Session - 03/15/21 1119     Visit Number 15    Number of Visits 18    Date for PT Re-Evaluation 03/20/21    PT Start Time 1103    PT Stop Time 1155    PT Time Calculation (min) 52 min    Activity Tolerance Patient tolerated treatment well    Behavior During Therapy [facility] for tasks assessed/performed             Past Medical History:  Diagnosis Date   Allergy    Anxiety    Breast cancer ([place])    Depression    Hx of adenomatous polyp of colon 07/18/2016   Irritable bowel syndrome    OBESITY     Past Surgical History:  Procedure Laterality Date   CESAREAN SECTION     x's 2   CHOLECYSTECTOMY  2009   ENT surgery  2004   MASTECTOMY W/ SENTINEL NODE BIOPSY Right 12/13/2020   Procedure: RIGHT MASTECTOMY WITH SENTINEL LYMPH NODE BIOPSY;  Surgeon: Coralie Keens, MD;  Location: [facility];  Service: General;  Laterality: Right;    There were no vitals filed for this visit.   Subjective Assessment - 03/15/21 1102     Subjective The tamoxifen makes me swell. I stopped it for a week because my face was really swollen and I woke up like that for 3 days.  It went away, and I restarted it again so far so good. Compression cami is doing well to sleep in and I am going to exchange one of my bras for another camisole so I can wear it to work. I am doing the MLD at home and it does feel better. My arm still feels cold and numb intermittently. I have the head CT next Friday.    Pertinent History Patient was diagnosed on 10/24/2020 with right grade I invasive ductal carcinoma breast cancer. She underwent a right mastectomy and sentinel node  bipsy (5 negative nodes) on 9/21/2022It is ER/PR positive and HER2 negative with a Ki67 of 1%.    Patient Stated Goals See if we can improve my swelling and scar tissue    Currently in Pain? No/denies    Pain Score 0-No pain                    L-DEX FLOWSHEETS - 03/15/21 1100       L-DEX LYMPHEDEMA SCREENING   Measurement Type Unilateral    L-DEX MEASUREMENT EXTREMITY Upper Extremity    POSITION  Standing    DOMINANT SIDE Right    At Risk Side Right    BASELINE SCORE (UNILATERAL) 0.5    L-DEX SCORE (UNILATERAL) -4.8    VALUE CHANGE (UNILAT) -5.3                                     PT Long Term Goals - 02/20/21 1020       PT LONG TERM GOAL #1   Title Patient will demonstrate she has regained full shoulder ROM and function post operatively compared ot baselines.    Baseline  full motion but does not have quite normal function compared to pre-surgery.    Time 4    Period Weeks    Status Achieved      PT LONG TERM GOAL #2   Title Patient will increase right shoulder flexion AROM to be >/= 160 degrees for increased ease reaching.    Baseline 133 post op; 163 pre-op    Time 4    Period Weeks    Status Achieved      PT LONG TERM GOAL #3   Title Patient will increase right shoulder active abduction to >/= 160 degrees for increased ease reaching and tolerating daily tasks.    Baseline 143 post op; 167 pre-op4    Time 4    Period Weeks    Status Achieved      PT LONG TERM GOAL #4   Title Patient will improve her DASH score to be </= 16 for improved overall UE function.    Baseline 15.91 pre op and 25 post op; 02/20/21-    Time 4      PT LONG TERM GOAL #5   Title Patient will report >/= 25% improvement in left upper quadrant swelling.    Time 4    Period Weeks    Status Achieved      Additional Long Term Goals   Additional Long Term Goals Yes      PT LONG TERM GOAL #6   Title pt will be progressed and independent with  right UE  strengthening,.    Time 3    Period Weeks    Status On-going      PT LONG TERM GOAL #7   Title Pt will have less sensitivity across R chest to allow pt to wear prosthetic without discomfort    Time 4    Period Weeks    Status New    Target Date 03/20/21                   Plan - 03/15/21 1119     Clinical Impression Statement performed SOZO with good result -5.3 points left of baseline despite pt feeling more swollen since on Tamoxifen. continuedSoft tissue mobilization, MFR techniques, and MLD for swelling.  Pt had multiple nodules in right pecs and lats, but no real tenderness since she is still very numb from surgery.  She continues to be bothered with the coldness and numb feeling in the posterior right arm, and complaints of bilateral swelling throughout her body that can be intermittent.    Stability/Clinical Decision Making Stable/Uncomplicated    Rehab Potential Excellent    PT Frequency 2x / week    PT Duration 4 weeks    PT Treatment/Interventions ADLs/Self Care Home Management;Therapeutic exercise;Patient/family education;Manual techniques;Manual lymph drainage;Passive range of motion;Scar mobilization;Vasopneumatic Device    PT Next Visit Plan ; MFR to Right axilla/UE cording, continue MLD and Self MLD to right upper quadrant., STM as needed How is pt doing with HEP?    PT Home Exercise Plan Post op shoulder HEP and closed chain flexion and abduction, supine scap series, standing SR, ext; self MLD 2x/day    Consulted and Agree with Plan of Care Patient             Patient will benefit from skilled therapeutic intervention in order to improve the following deficits and impairments:  Postural dysfunction, Decreased range of motion, Decreased knowledge of precautions, Impaired UE functional use, Pain, Increased edema, Decreased scar mobility, Increased fascial restricitons  Visit Diagnosis: Aftercare following surgery for neoplasm  Localized edema  Stiffness of  right shoulder, not elsewhere classified  Abnormal posture     Problem List Patient Active Problem List   Diagnosis Date Noted   S/P mastectomy, right 12/13/2020   Malignant neoplasm of upper-outer quadrant of right breast in female, estrogen receptor positive ([place]) 11/13/2020   Chronic pain of left knee 04/15/2017   Hx of adenomatous polyp of colon 07/18/2016   Ventral hernia 11/15/2015   Routine general medical examination at a health care facility 11/24/2014   Anxiety    HEARING LOSS 03/14/2010   Obesity 03/06/2010   IRRITABLE BOWEL SYNDROME 03/06/2010    Claris Pong, PT 03/15/2021, 12:04 PM  [facility] @ [place], [place], [address] Phone: [phone]   Fax:  [phone]  Name: Donna Robbins MRN: [id number] Date of Birth: 09-Jan-1971

## 2021-03-16 ENCOUNTER — Telehealth: Payer: Self-pay | Admitting: Adult Health

## 2021-03-16 NOTE — Telephone Encounter (Signed)
Called and got patient set up for survivorship care plan visit.  I sent a scheduling message.  She verbalized understanding.  Wilber Bihari, NP 03/16/21 4:51 PM Medical Oncology and Hematology [facility] [place], [place] [address] Tel. [phone]    Fax. [phone]

## 2021-03-20 ENCOUNTER — Ambulatory Visit: Payer: 59

## 2021-03-20 ENCOUNTER — Other Ambulatory Visit: Payer: Self-pay

## 2021-03-20 ENCOUNTER — Ambulatory Visit: Payer: Self-pay

## 2021-03-20 DIAGNOSIS — Z483 Aftercare following surgery for neoplasm: Secondary | ICD-10-CM | POA: Diagnosis not present

## 2021-03-20 DIAGNOSIS — M25611 Stiffness of right shoulder, not elsewhere classified: Secondary | ICD-10-CM

## 2021-03-20 DIAGNOSIS — M6281 Muscle weakness (generalized): Secondary | ICD-10-CM | POA: Diagnosis not present

## 2021-03-20 DIAGNOSIS — R600 Localized edema: Secondary | ICD-10-CM

## 2021-03-20 DIAGNOSIS — Z170 Estrogen receptor positive status [ER+]: Secondary | ICD-10-CM | POA: Diagnosis not present

## 2021-03-20 DIAGNOSIS — C50411 Malignant neoplasm of upper-outer quadrant of right female breast: Secondary | ICD-10-CM | POA: Diagnosis not present

## 2021-03-20 DIAGNOSIS — R293 Abnormal posture: Secondary | ICD-10-CM | POA: Diagnosis not present

## 2021-03-20 NOTE — Therapy (Signed)
[facility] °[facility] @ [place] °[address] °[place], [place], [address] °Phone: [phone]   Fax:  [phone] ° °Physical Therapy Treatment ° °Patient Details  °Name: Donna Robbins °MRN: [id number] °Date of Birth: 02/12/1971 °Referring Provider (PT): Dr. Douglas Blackman ° ° °Encounter Date: 03/20/2021 ° ° PT End of Session - 03/20/21 1710   ° ° Visit Number 16   ° Number of Visits 18   ° Date for PT Re-Evaluation 03/20/21   ° PT Start Time 1604   ° PT Stop Time 1659   ° PT Time Calculation (min) 55 min   ° Activity Tolerance Patient tolerated treatment well   ° Behavior During Therapy [facility] for tasks assessed/performed   ° °  °  ° °  ° ° °Past Medical History:  °Diagnosis Date  ° Allergy   ° Anxiety   ° Breast cancer ([facility])   ° Depression   ° Hx of adenomatous polyp of colon 07/18/2016  ° Irritable bowel syndrome   ° OBESITY   ° ° °Past Surgical History:  °Procedure Laterality Date  ° CESAREAN SECTION    ° x's 2  ° CHOLECYSTECTOMY  2009  ° ENT surgery  2004  ° MASTECTOMY W/ SENTINEL NODE BIOPSY Right 12/13/2020  ° Procedure: RIGHT MASTECTOMY WITH SENTINEL LYMPH NODE BIOPSY;  Surgeon: Blackman, Douglas, MD;  [facility];  Service: General;  Laterality: Right;  ° ° °There were no vitals filed for this visit. ° ° Subjective Assessment - 03/20/21 1610   ° ° Subjective I think I have all the tools I need to be able to care for myself.  MY shoulder ROM is doing well. I have the XL Cami but I think its a little big. I am going to go to [employer] and try on the Large. Cording is better but still there but doesn't really effect my ROM.  I have my sleeve at home.  I wear a compression cami to bed.My right arm does feel weak and I have been doing my band exercises. The Tamoxifen makes me swell bilaterally and I get achy at times.   ° Pertinent History Patient was diagnosed on 10/24/2020 with right grade I invasive ductal carcinoma breast cancer. She underwent a right  mastectomy and sentinel node bipsy (5 negative nodes) on 9/21/2022It is ER/PR positive and HER2 negative with a Ki67 of 1%.   ° Patient Stated Goals See if we can improve my swelling and scar tissue   ° Currently in Pain? No/denies   ° Pain Score 0-No pain   ° °  °  ° °  ° ° ° ° ° [facility] PT Assessment - 03/20/21 0001   ° °  ° Assessment  ° Medical Diagnosis s/p right mastectomy and SLNB   ° Referring Provider (PT) Dr. Douglas Blackman   ° Onset Date/Surgical Date 12/13/20   ° Hand Dominance Right   °  ° Precautions  ° Precaution Comments right arm lymphedema risk   °  ° Prior Function  ° Level of Independence Independent   °  ° Cognition  ° Overall Cognitive Status Within Functional Limits for tasks assessed   °  ° AROM  ° Right Shoulder Extension 60 Degrees   ° Right Shoulder Flexion 173 Degrees   ° Right Shoulder ABduction 180 Degrees   ° Right Shoulder External Rotation 107 Degrees   °  ° Strength  ° Overall Strength Within functional limits for tasks   performed   ° °  °  ° °  ° ° ° ° ° ° ° ° Quick Dash - 03/20/21 0001   ° ° Open a tight or new jar Mild difficulty   ° Do heavy household chores (wash walls, wash floors) Mild difficulty   ° Carry a shopping bag or briefcase Mild difficulty   ° Wash your back No difficulty   ° Use a knife to cut food No difficulty   ° Recreational activities in which you take some force or impact through your arm, shoulder, or hand (golf, hammering, tennis) Moderate difficulty   ° During the past week, to what extent has your arm, shoulder or hand problem interfered with your normal social activities with family, friends, neighbors, or groups? Slightly   ° During the past week, to what extent has your arm, shoulder or hand problem limited your work or other regular daily activities Slightly   ° Arm, shoulder, or hand pain. None   ° Tingling (pins and needles) in your arm, shoulder, or hand None   ° Difficulty Sleeping Mild difficulty   ° DASH Score 18.18 %   ° °  °  ° °   ° ° ° ° ° ° ° ° ° [facility] Adult PT Treatment/Exercise - 03/20/21 0001   ° °  ° Exercises  ° Other Exercises  Right elbow flexion with red theraband x 10, elbow extension with red x 10, jobes flexion and scaption with 1# x 10 thumbs up.   °  ° Manual Therapy  ° Myofascial Release to right axilla, upper arm, and elbow region of cording with elongation and S technique   ° °  °  ° °  ° ° ° ° ° ° ° ° ° ° PT Education - 03/20/21 1704   ° ° Education Details Pt educated in elbow flexion and extension with theraband, jobes flexion and scaption with 1 # wt.   ° Person(s) Educated Patient   ° Methods Handout;Demonstration   ° Comprehension Returned demonstration   ° °  °  ° °  ° ° ° ° ° ° PT Long Term Goals - 03/20/21 1617   ° °  ° PT LONG TERM GOAL #1  ° Title Patient will demonstrate she has regained full shoulder ROM and function post operatively compared ot baselines.   ° Period Weeks   ° Status Achieved   °  ° PT LONG TERM GOAL #2  ° Title Patient will increase right shoulder flexion AROM to be >/= 160 degrees for increased ease reaching.   ° Time 4   ° Period Weeks   ° Status Achieved   °  ° PT LONG TERM GOAL #3  ° Title Patient will increase right shoulder active abduction to >/= 160 degrees for increased ease reaching and tolerating daily tasks.   ° Time 4   ° Period Weeks   ° Status Achieved   °  ° PT LONG TERM GOAL #4  ° Title Patient will improve her DASH score to be </= 16 for improved overall UE function.   ° Baseline 18 % today.   ° Time 4   ° Period Weeks   ° Status Not Met   °  ° PT LONG TERM GOAL #5  ° Title Patient will report >/= 25% improvement in left upper quadrant swelling.   ° Time 4   ° Period Weeks   ° Status Achieved   °  ° PT   LONG TERM GOAL #6  ° Title pt will be progressed and independent with  right UE strengthening,.   ° Time 3   ° Period Weeks   ° Status Achieved   °  ° PT LONG TERM GOAL #7  ° Title Pt will have less sensitivity across R chest to allow pt to wear prosthetic without discomfort   °  Time 4   ° Period Weeks   ° Status Partially Met   ° Target Date 03/20/21   ° °  °  ° °  ° ° ° ° ° ° ° ° Plan - 03/20/21 1714   ° ° Clinical Impression Statement Pt assessed today for discharge.  Pt feels she is capable of managing symptoms at home now.  She has achieved all shoulder ROM goals and swelling goal.  She is compliant with her HEP for ROM and strengthening. She has partially met goal for decreased chest sensitivity to wear prosthesis, although by the end of the day she feels ready to remove it. She has not quite achieved quick dash goal of 16% and was at 18 % today. There is no visible swelling when assessing pt from behind, and min swelling at the anterior lateral trunk.  She is doing very well overall.  She will continue 3 month SOZO screens and will contact us with any questions or concerns. We discussed use of sleeve for repetitive activities, flying etc.   ° Stability/Clinical Decision Making Stable/Uncomplicated   ° Rehab Potential Excellent   ° PT Frequency 2x / week   ° PT Duration 4 weeks   ° PT Treatment/Interventions ADLs/Self Care Home Management;Therapeutic exercise;Patient/family education;Manual techniques;Manual lymph drainage;Passive range of motion;Scar mobilization;Vasopneumatic Device   ° PT Next Visit Plan DC today to independent home management   ° PT Home Exercise Plan Post op shoulder HEP and closed chain flexion and abduction, supine scap series, standing SR, ext; self MLD 2x/day, elbow flex and ext with red band, jobes flexion and scaption with 1# wt.   ° Consulted and Agree with Plan of Care Patient   ° °  °  ° °  ° ° °Patient will benefit from skilled therapeutic intervention in order to improve the following deficits and impairments:  Postural dysfunction, Decreased range of motion, Decreased knowledge of precautions, Impaired UE functional use, Pain, Increased edema, Decreased scar mobility, Increased fascial restricitons ° °Visit Diagnosis: °Aftercare following surgery for  neoplasm ° °Localized edema ° °Stiffness of right shoulder, not elsewhere classified ° °Abnormal posture ° ° ° ° °Problem List °Patient Active Problem List  ° Diagnosis Date Noted  ° S/P mastectomy, right 12/13/2020  ° Malignant neoplasm of upper-outer quadrant of right breast in female, estrogen receptor positive ([facility]) 11/13/2020  ° Chronic pain of left knee 04/15/2017  ° Hx of adenomatous polyp of colon 07/18/2016  ° Ventral hernia 11/15/2015  ° Routine general medical examination at a health care facility 11/24/2014  ° Anxiety   ° HEARING LOSS 03/14/2010  ° Obesity 03/06/2010  ° IRRITABLE BOWEL SYNDROME 03/06/2010  ° °PHYSICAL THERAPY DISCHARGE SUMMARY ° °Visits from Start of Care: 16 ° °Current functional level related to goals / functional outcomes: °Achieved or partially achieved all but 1 LTG °  °Remaining deficits: ° Quick dash 18 % did not meet goal of 16%, complaints of increased swelling with Tamoxifen °  °Education / Equipment: °Compression bra/sleeve/Camis/theraband  ° °Patient agrees to discharge. Patient goals were partially met. Patient is being discharged due to    achieving most goals and feeling ready to be discharged.Claris Pong, PT 03/20/2021, 5:23 PM  [facility] @ [place], [place], [address] Phone: [phone]   Fax:  [phone]  Name: SHANIELLE CORRELL MRN: [id number] Date of Birth: 21-Mar-1971

## 2021-03-20 NOTE — Patient Instructions (Signed)
Access Code: [email or web address] URL: [email or web address] Date: 03/20/2021 Prepared by: Cheral Almas  Exercises Standing Elbow Flexion with Resistance - 1 x daily - 3 x weekly - 1 sets - 10 reps Standing Elbow Extension with Self-Anchored Resistance - 1 x daily - 3 x weekly - 1 sets - 10 reps Standing Shoulder Flexion to 90 Degrees with Dumbbells - 1 x daily - 3 x weekly - 1 sets - 10 reps Scaption with Dumbbells - 1 x daily - 3 x weekly - 1 sets - 10 reps

## 2021-03-21 NOTE — Progress Notes (Signed)
SURVIVORSHIP VIRTUAL VISIT:  I connected with Donna Robbins on 03/22/21 at 12:00 PM EST by my chart video and verified that I am speaking with the correct person using two identifiers.  I discussed the limitations, risks, security and privacy concerns of performing an evaluation and management service by telephone and the availability of in person appointments. I also discussed with the patient that there may be a patient responsible charge related to this service. The patient expressed understanding and agreed to proceed.   BRIEF ONCOLOGIC HISTORY:  Oncology History  Malignant neoplasm of upper-outer quadrant of right breast in female, estrogen receptor positive ([place])  11/06/2020 Mammogram   Exam: 3D Mammogram Diagnostic - Right Breast Ultrasound - Right  IMPRESSION: The 0.8 cm irregular mass in the right breast at 9 o'clock is highly suggestive of malignancy. Ultrasound of the right axilla is negative for lymphadenopathy.   11/08/2020 Initial Biopsy   Diagnosis Breast, right, needle core biopsy, 9 o'clock 10cm fn posterior depth - INVASIVE DUCTAL CARCINOMA, GRADE 1/2. - DUCTAL CARCINOMA IN SITU. - SEE MICROSCOPIC DESCRIPTION.  Microscopic Comment The greatest tumor dimension is 0.5 cm.  PROGNOSTIC INDICATORS Results: The tumor cells are NEGATIVE for Her2 (0). Estrogen Receptor: 100%, POSITIVE, STRONG STAINING INTENSITY Progesterone Receptor: 100%, POSITIVE, STRONG STAINING INTENSITY Proliferation Marker Ki67: 1%   11/13/2020 Initial Diagnosis   Malignant neoplasm of upper-outer quadrant of right breast in female, estrogen receptor positive ([place])   11/15/2020 Cancer Staging   Staging form: Breast, AJCC 8th Edition - Clinical stage from 11/15/2020: Stage IA (cT1b, cN0, cM0, G1, ER+, PR+, HER2-) - Signed by Chauncey Cruel, MD on 11/15/2020 Stage prefix: Initial diagnosis Histologic grading system: 3 grade system    12/13/2020 Cancer Staging   Staging form: Breast, AJCC  8th Edition - Pathologic stage from 12/13/2020: Stage IA (pT1b, pN0, cM0, G1, ER+, PR+, HER2-) - Signed by Gardenia Phlegm, NP on 03/20/2021 Stage prefix: Initial diagnosis Histologic grading system: 3 grade system    12/13/2020 Definitive Surgery   FINAL MICROSCOPIC DIAGNOSIS:   A. BREAST, RIGHT, MASTECTOMY:  - Invasive and in situ ductal carcinoma, 0.9 cm.  - Margins not involved.  - Biopsy site and biopsy clip.  - See oncology table.   B. LYMPH NODE, RIGHT AXILLARY #1, SENTINEL, EXCISION:  - One lymph node negative for metastatic carcinoma (0/1).   C. LYMPH NODE, RIGHT AXILLARY #2, SENTINEL, EXCISION:  - One lymph node negative for metastatic carcinoma (0/1).   D. LYMPH NODE, RIGHT AXILLARY, SENTINEL, EXCISION:  - One lymph node negative for metastatic carcinoma (0/1).   E. LYMPH NODE, RIGHT AXILLARY, SENTINEL, EXCISION:  - One lymph node negative for metastatic carcinoma (0/1).   F. LYMPH NODE, RIGHT AXILLARY, SENTINEL, EXCISION:  - One lymph node negative for metastatic carcinoma (0/1).    12/13/2020 Oncotype testing   Oncotype DX was obtained on the final surgical sample and the recurrence score of 8 predicts a risk of recurrence outside the breast over the next 9 years of 3%, if the patient's only systemic therapy is an antiestrogen for 5 years.  It also predicts a <1% benefit from chemotherapy.   01/2021 -  Anti-estrogen oral therapy   Tamoxifen daily     INTERVAL HISTORY:  Donna Robbins to review her survivorship care plan detailing her treatment course for breast cancer, as well as monitoring long-term side effects of that treatment, education regarding health maintenance, screening, and overall wellness and health promotion.     Overall, Ms.  Robbins reports feeling moderately well.  She started tamoxifen in November.  She notes that during her recovery from surgery, she developed swelling in her eyes a few weeks later.  She had stopped tamoxifen due to the  side effects, and she noticed that not only did the swelling improved but her emotional distress and tearfulness improved as well.  She notes that she is struggling with enjoying things that she used to enjoy.  She notes that she has had some paresthesias around her face and woke up this morning dizzy and is undergoing imaging tomorrow with a CT scan of her neck and head to evaluate this.  This is made her very anxious.  REVIEW OF SYSTEMS:  Review of Systems  Constitutional:  Negative for appetite change, chills, fatigue, fever and unexpected weight change.  HENT:   Negative for hearing loss, lump/mass and trouble swallowing.   Eyes:  Negative for eye problems and icterus.  Respiratory:  Negative for chest tightness, cough and shortness of breath.   Cardiovascular:  Negative for chest pain, leg swelling and palpitations.  Gastrointestinal:  Negative for abdominal distention, abdominal pain, constipation, diarrhea, nausea and vomiting.  Endocrine: Negative for hot flashes.  Genitourinary:  Negative for difficulty urinating.   Musculoskeletal:  Negative for arthralgias.  Skin:  Negative for itching and rash.  Neurological:  Negative for dizziness, extremity weakness, headaches and numbness.  Hematological:  Negative for adenopathy. Does not bruise/bleed easily.  Psychiatric/Behavioral:  Positive for decreased concentration and depression. Negative for suicidal ideas. The patient is nervous/anxious.   Breast: Denies any new nodularity, masses, tenderness, nipple changes, or nipple discharge.      ONCOLOGY TREATMENT TEAM:  1. Surgeon:  Dr. Ninfa Linden at [facility] 2. Medical Oncologist: Dr. Chryl Heck  3. Radiation Oncologist: Dr. Sondra Come    PAST MEDICAL/SURGICAL HISTORY:  Past Medical History:  Diagnosis Date   Allergy    Anxiety    Breast cancer ([place])    Depression    Hx of adenomatous polyp of colon 07/18/2016   Irritable bowel syndrome    OBESITY    Past Surgical History:   Procedure Laterality Date   CESAREAN SECTION     x's 2   CHOLECYSTECTOMY  2009   ENT surgery  2004   MASTECTOMY W/ SENTINEL NODE BIOPSY Right 12/13/2020   Procedure: RIGHT MASTECTOMY WITH SENTINEL LYMPH NODE BIOPSY;  Surgeon: Coralie Keens, MD;  Location: [facility];  Service: General;  Laterality: Right;     ALLERGIES:  Allergies  Allergen Reactions   Iodine Itching    REACTION: Itching   Sulfonamide Derivatives Itching and Rash    REACTION: Itching Rash     CURRENT MEDICATIONS:  Outpatient Encounter Medications as of 03/22/2021  Medication Sig Note   acetaminophen (TYLENOL) 500 MG tablet Take 1,000 mg by mouth 2 (two) times daily as needed (joint/muscle pain.).    cholecalciferol (VITAMIN D) 1000 units tablet Take 1,000 Units by mouth in the morning.    clonazePAM (KLONOPIN) 0.5 MG tablet TAKE 1/2 TO 1 TABLET BY MOUTH DAILY AS NEEDED FOR ANXIETY (Patient taking differently: Take 0.25 mg by mouth daily as needed for anxiety.)    tamoxifen (NOLVADEX) 20 MG tablet Take 1 tablet by mouth daily.    vitamin B-12 (CYANOCOBALAMIN) 1000 MCG tablet Take 1,000 mcg by mouth in the morning.    Multiple Vitamin (MULTIVITAMIN WITH MINERALS) TABS tablet Take 1 tablet by mouth in the morning. (Patient not taking: Reported on 03/22/2021)  nitroGLYCERIN (NITRODUR - DOSED IN MG/24 HR) 0.2 mg/hr patch APPLY 1/4 PATCH DAILY TO TENDON FOR TENDONITIS. (Patient not taking: Reported on 03/22/2021) 11/30/2020: On hand--patient has never used   No facility-administered encounter medications on file as of 03/22/2021.     ONCOLOGIC FAMILY HISTORY:  Family History  Problem Relation Age of Onset   Diabetes Mother    Hyperlipidemia Mother    Cancer Father        Angio sarcoma   Hyperlipidemia Father    Arthritis Maternal Grandmother    Lung cancer Maternal Grandmother    Lung cancer Maternal Grandfather    Lung cancer Paternal Grandmother    Arthritis Other    Colon cancer Neg Hx    Stomach cancer  Neg Hx    Rectal cancer Neg Hx    Esophageal cancer Neg Hx    Liver cancer Neg Hx      GENETIC COUNSELING/TESTING: Not at this time  SOCIAL HISTORY:  Social History   Socioeconomic History   Marital status: Married    Spouse name: Not on file   Number of children: 2   Years of education: Not on file   Highest education level: Not on file  Occupational History   Occupation: Community education officer    Employer: [employer]  Tobacco Use   Smoking status: Never   Smokeless tobacco: Never  Substance and Sexual Activity   Alcohol use: Yes    Alcohol/week: 2.0 standard drinks    Types: 2 Glasses of wine per week    Comment: Rarely   Drug use: Yes    Types: Marijuana    Comment: once a month   Sexual activity: Not on file  Other Topics Concern   Not on file  Social History Narrative   Not on file   Social Determinants of Health   Financial Resource Strain: Low Risk    Difficulty of Paying Living Expenses: Not hard at all  Food Insecurity: No Food Insecurity   Worried About Charity fundraiser in the Last Year: Never true   Ran Out of Food in the Last Year: Never true  Transportation Needs: No Transportation Needs   Lack of Transportation (Medical): No   Lack of Transportation (Non-Medical): No  Physical Activity: Sufficiently Active   Days of Exercise per Week: 3 days   Minutes of Exercise per Session: 60 min  Stress: Stress Concern Present   Feeling of Stress : To some extent  Social Connections: Unknown   Frequency of Communication with Friends and Family: More than three times a week   Frequency of Social Gatherings with Friends and Family: Twice a week   Attends Religious Services: Never   Marine scientist or Organizations: Yes   Attends Music therapist: 1 to 4 times per year   Marital Status: Not on file  Intimate Partner Violence: Not At Risk   Fear of Current or Ex-Partner: No   Emotionally Abused: No   Physically Abused: No    Sexually Abused: No     OBSERVATIONS/OBJECTIVE:  Patient sounds well.  Her breathing is nonlabored.  During part of our conversation you could tell that she was crying.  Otherwise her mood and behavior were stable. LABORATORY DATA:  None for this visit.  DIAGNOSTIC IMAGING:  None for this visit.      ASSESSMENT AND PLAN:  Donna Robbins is a pleasant 50 y.o. female with Stage IA right breast invasive ductal carcinoma, ER+/PR+/HER2-,  diagnosed in 10/2020, treated with lumpectomy, adjuvant radiation therapy, and anti-estrogen therapy with Tamoxifen beginning in November 2022..  She presents to the Survivorship Clinic for our initial meeting and routine follow-up post-completion of treatment for breast cancer.    1. Stage IA right breast cancer:  Donna Robbins is continuing to recover from definitive treatment for breast cancer. She will follow-up with her medical oncologist, Dr. Lindi Adie in 6 months with history and physical exam per surveillance protocol.  I recommended that she continue to undergo annual left breast screening mammograms.  Today, a comprehensive survivorship care plan and treatment summary was reviewed with the patient today detailing her breast cancer diagnosis, treatment course, potential late/long-term effects of treatment, appropriate follow-up care with recommendations for the future, and patient education resources.  A copy of this summary, along with a letter will be sent to the patients primary care provider via mail/fax/In Basket message after todays visit.    2.  Emotional changes on tamoxifen: General I spent a large part of our discussion reviewing these concerns.  She notes in addition to feeling anxious and emotional that she also has decreased concentration related to this.  We discussed the option of changing her oral antiestrogen therapy to an aromatase inhibitor plus Zoladex injection.  The other option would be to consider Effexor 37.5 mg a day to help with  some of her anxiety which can improve her decreased concentration and help ameliorate side effects that can occur with the tamoxifen such as hot flashes.  Jan has decided to try the Effexor.  I reviewed with her that it can take as long as 2 weeks to notice any improvement.  She will let us know if she has any difficulty tolerating this medication.  3. Bone health: She was given education on specific activities to promote bone health.  4. Cancer screening:  Due to Ms. Cieslewicz's history and her age, she should receive screening for skin cancers, colon cancer, and gynecologic cancers.  The information and recommendations are listed on the patient's comprehensive care plan/treatment summary and were reviewed in detail with the patient.    5. Health maintenance and wellness promotion: Ms. Crass was encouraged to consume 5-7 servings of fruits and vegetables per day. We reviewed the "Nutrition Rainbow" handout.  She was also encouraged to engage in moderate to vigorous exercise for 30 minutes per day most days of the week. We discussed the [employer] fitness program, which is designed for cancer survivors to help them become more physically fit after cancer treatments.  She was instructed to limit her alcohol consumption and continue to abstain from tobacco use.     6. Support services/counseling: It is not uncommon for this period of the patient's cancer care trajectory to be one of many emotions and stressors.   She was given information regarding our available services and encouraged to contact me with any questions or for help enrolling in any of our support group/programs.    Follow up instructions:    -Return to cancer center in 6 months  -Continue with annual left breast screening mammograms -Follow up with surgery in 3 months -She is welcome to return back to the Survivorship Clinic at any time; no additional follow-up needed at this time.  -Consider referral back to survivorship as a  long-term survivor for continued surveillance  The patient was provided an opportunity to ask questions and all were answered. The patient agreed with the plan and demonstrated an understanding of the instructions.  The patient was advised to call back or seek an in-person evaluation if the symptoms worsen or if the condition fails to improve as anticipated.   I provided 21 minutes of non face-to-face telephone visit time during this encounter, and > 50% was spent counseling as documented under my assessment & plan.  Wilber Bihari, NP 03/22/21 2:51 PM Medical Oncology and Hematology [facility] [place], [place] [address] Tel. [phone]    Fax. [phone]  *Total Encounter Time as defined by the Centers for Medicare and Medicaid Services includes, in addition to the face-to-face time of a patient visit (documented in the note above) non-face-to-face time: obtaining and reviewing outside history, ordering and reviewing medications, tests or procedures, care coordination (communications with other health care professionals or caregivers) and documentation in the medical record.

## 2021-03-22 ENCOUNTER — Inpatient Hospital Stay: Payer: 59 | Attending: Oncology | Admitting: Adult Health

## 2021-03-22 ENCOUNTER — Ambulatory Visit: Payer: 59

## 2021-03-22 ENCOUNTER — Other Ambulatory Visit (HOSPITAL_COMMUNITY): Payer: Self-pay

## 2021-03-22 ENCOUNTER — Encounter: Payer: Self-pay | Admitting: Adult Health

## 2021-03-22 DIAGNOSIS — Z170 Estrogen receptor positive status [ER+]: Secondary | ICD-10-CM | POA: Diagnosis not present

## 2021-03-22 DIAGNOSIS — C50411 Malignant neoplasm of upper-outer quadrant of right female breast: Secondary | ICD-10-CM

## 2021-03-22 MED ORDER — VENLAFAXINE HCL ER 37.5 MG PO CP24
37.5000 mg | ORAL_CAPSULE | Freq: Every day | ORAL | 0 refills | Status: DC
  Filled 2021-03-22: qty 30, 30d supply, fill #0

## 2021-03-23 ENCOUNTER — Other Ambulatory Visit: Payer: Self-pay

## 2021-03-23 ENCOUNTER — Telehealth: Payer: Self-pay | Admitting: Adult Health

## 2021-03-23 ENCOUNTER — Ambulatory Visit
Admission: RE | Admit: 2021-03-23 | Discharge: 2021-03-23 | Disposition: A | Discharge status: Home / Self Care | Payer: 59 | Source: Ambulatory Visit | Attending: Internal Medicine | Admitting: Internal Medicine

## 2021-03-23 DIAGNOSIS — R200 Anesthesia of skin: Secondary | ICD-10-CM | POA: Diagnosis not present

## 2021-03-23 DIAGNOSIS — R202 Paresthesia of skin: Secondary | ICD-10-CM | POA: Diagnosis not present

## 2021-03-23 DIAGNOSIS — R29818 Other symptoms and signs involving the nervous system: Secondary | ICD-10-CM | POA: Diagnosis not present

## 2021-03-23 DIAGNOSIS — R42 Dizziness and giddiness: Secondary | ICD-10-CM | POA: Diagnosis not present

## 2021-03-23 NOTE — Telephone Encounter (Signed)
Scheduled appointment per 12/29 los. Patient is aware. °

## 2021-03-27 ENCOUNTER — Encounter: Payer: Self-pay | Admitting: Internal Medicine

## 2021-03-27 DIAGNOSIS — R200 Anesthesia of skin: Secondary | ICD-10-CM

## 2021-03-29 ENCOUNTER — Encounter: Payer: Self-pay | Admitting: Neurology

## 2021-03-29 ENCOUNTER — Encounter: Payer: Self-pay | Admitting: Adult Health

## 2021-03-30 ENCOUNTER — Telehealth: Payer: Self-pay

## 2021-03-30 NOTE — Telephone Encounter (Signed)
Called and left VM for pt to return call to further discuss her mychart message.

## 2021-04-02 ENCOUNTER — Telehealth: Payer: Self-pay | Admitting: Internal Medicine

## 2021-04-02 NOTE — Telephone Encounter (Signed)
Sch per 1/9 inbasket, pt aware °

## 2021-04-05 ENCOUNTER — Inpatient Hospital Stay: Payer: 59 | Admitting: Internal Medicine

## 2021-04-09 ENCOUNTER — Inpatient Hospital Stay: Payer: 59 | Attending: Internal Medicine | Admitting: Internal Medicine

## 2021-04-09 ENCOUNTER — Other Ambulatory Visit: Payer: Self-pay

## 2021-04-09 VITALS — BP 126/83 | HR 111 | Temp 99.1°F | Resp 18 | Wt 172.1 lb

## 2021-04-09 DIAGNOSIS — G509 Disorder of trigeminal nerve, unspecified: Secondary | ICD-10-CM

## 2021-04-09 DIAGNOSIS — D2262 Melanocytic nevi of left upper limb, including shoulder: Secondary | ICD-10-CM | POA: Diagnosis not present

## 2021-04-09 DIAGNOSIS — Z170 Estrogen receptor positive status [ER+]: Secondary | ICD-10-CM | POA: Diagnosis not present

## 2021-04-09 DIAGNOSIS — D225 Melanocytic nevi of trunk: Secondary | ICD-10-CM | POA: Diagnosis not present

## 2021-04-09 DIAGNOSIS — Z9011 Acquired absence of right breast and nipple: Secondary | ICD-10-CM | POA: Insufficient documentation

## 2021-04-09 DIAGNOSIS — D2272 Melanocytic nevi of left lower limb, including hip: Secondary | ICD-10-CM | POA: Diagnosis not present

## 2021-04-09 DIAGNOSIS — C50411 Malignant neoplasm of upper-outer quadrant of right female breast: Secondary | ICD-10-CM

## 2021-04-09 DIAGNOSIS — Z79810 Long term (current) use of selective estrogen receptor modulators (SERMs): Secondary | ICD-10-CM | POA: Diagnosis not present

## 2021-04-09 DIAGNOSIS — D1801 Hemangioma of skin and subcutaneous tissue: Secondary | ICD-10-CM | POA: Diagnosis not present

## 2021-04-09 DIAGNOSIS — L821 Other seborrheic keratosis: Secondary | ICD-10-CM | POA: Diagnosis not present

## 2021-04-09 NOTE — Progress Notes (Signed)
[facility] at [facility] [address]  [place], [place] [address] [phone]   New Patient Evaluation  Date of Service: 04/09/21 Patient Name: Donna Robbins Patient MRN: [id number] Patient DOB: [birth year] Provider: Henreitta Leber, MD  Identifying Statement:  VALENE VILLA is a 51 y.o. female with Malignant neoplasm of upper-outer quadrant of right breast in female, estrogen receptor positive (HCC) - Plan: MR BRAIN W WO CONTRAST  Trigeminal neuropathy - Plan: MR BRAIN W WO CONTRAST who presents for initial consultation and evaluation regarding cancer associated neurologic deficits.    Referring Provider: Myrlene Broker, MD [address] [place],  [place] [address]  Primary Cancer:  Oncologic History: Oncology History  Malignant neoplasm of upper-outer quadrant of right breast in female, estrogen receptor positive (HCC)  11/06/2020 Mammogram   Exam: 3D Mammogram Diagnostic - Right Breast Ultrasound - Right  IMPRESSION: The 0.8 cm irregular mass in the right breast at 9 o'clock is highly suggestive of malignancy. Ultrasound of the right axilla is negative for lymphadenopathy.   11/08/2020 Initial Biopsy   Diagnosis Breast, right, needle core biopsy, 9 o'clock 10cm fn posterior depth - INVASIVE DUCTAL CARCINOMA, GRADE 1/2. - DUCTAL CARCINOMA IN SITU. - SEE MICROSCOPIC DESCRIPTION.  Microscopic Comment The greatest tumor dimension is 0.5 cm.  PROGNOSTIC INDICATORS Results: The tumor cells are NEGATIVE for Her2 (0). Estrogen Receptor: 100%, POSITIVE, STRONG STAINING INTENSITY Progesterone Receptor: 100%, POSITIVE, STRONG STAINING INTENSITY Proliferation Marker Ki67: 1%   11/13/2020 Initial Diagnosis   Malignant neoplasm of upper-outer quadrant of right breast in female, estrogen receptor positive (HCC)   11/15/2020 Cancer Staging   Staging form: Breast, AJCC 8th Edition - Clinical stage from 11/15/2020: Stage IA (cT1b,  cN0, cM0, G1, ER+, PR+, HER2-) - Signed by Lowella Dell, MD on 11/15/2020 Stage prefix: Initial diagnosis Histologic grading system: 3 grade system    12/13/2020 Cancer Staging   Staging form: Breast, AJCC 8th Edition - Pathologic stage from 12/13/2020: Stage IA (pT1b, pN0, cM0, G1, ER+, PR+, HER2-) - Signed by Loa Socks, NP on 03/20/2021 Stage prefix: Initial diagnosis Histologic grading system: 3 grade system    12/13/2020 Definitive Surgery   FINAL MICROSCOPIC DIAGNOSIS:   A. BREAST, RIGHT, MASTECTOMY:  - Invasive and in situ ductal carcinoma, 0.9 cm.  - Margins not involved.  - Biopsy site and biopsy clip.  - See oncology table.   B. LYMPH NODE, RIGHT AXILLARY #1, SENTINEL, EXCISION:  - One lymph node negative for metastatic carcinoma (0/1).   C. LYMPH NODE, RIGHT AXILLARY #2, SENTINEL, EXCISION:  - One lymph node negative for metastatic carcinoma (0/1).   D. LYMPH NODE, RIGHT AXILLARY, SENTINEL, EXCISION:  - One lymph node negative for metastatic carcinoma (0/1).   E. LYMPH NODE, RIGHT AXILLARY, SENTINEL, EXCISION:  - One lymph node negative for metastatic carcinoma (0/1).   F. LYMPH NODE, RIGHT AXILLARY, SENTINEL, EXCISION:  - One lymph node negative for metastatic carcinoma (0/1).    12/13/2020 Oncotype testing   Oncotype DX was obtained on the final surgical sample and the recurrence score of 8 predicts a risk of recurrence outside the breast over the next 9 years of 3%, if the patient's only systemic therapy is an antiestrogen for 5 years.  It also predicts a <1% benefit from chemotherapy.   01/2021 -  Anti-estrogen oral therapy   Tamoxifen daily     History of Present Illness: The patient's records from the referring physician were  obtained and reviewed and the patient interviewed to confirm this HPI.  [name] presents today to review recent neurologic symptoms.  She describes relatively sudden onset of right sided facial sensory  change, starting 03/22/21.  She woke up feeling "funny on right side face, hard to describe, like constantly about to get goosebumps".  This symptom has persisted since that time, now almost three weeks.  Several things can exacerbate the sensory impairment, including pressure applied to right arm/chest (putting on clothing or prosthetic), stress/anxiety, possibly some medications.  In addition, she experiences some bouts of vertigo when turning or bending forward/backwards; this may also worsen the sensory issue.  Finally, she has some nerve damage in her arm following right mastectomy on 11/27/20, this lead to periods of shooting pain, burning or numbness.  Medications: Current Outpatient Medications on File Prior to Visit  Medication Sig Dispense Refill   acetaminophen (TYLENOL) 500 MG tablet Take 1,000 mg by mouth 2 (two) times daily as needed (joint/muscle pain.).     cholecalciferol (VITAMIN D) 1000 units tablet Take 1,000 Units by mouth in the morning.     clonazePAM (KLONOPIN) 0.5 MG tablet TAKE 1/2 TO 1 TABLET BY MOUTH DAILY AS NEEDED FOR ANXIETY (Patient taking differently: Take 0.25 mg by mouth daily as needed for anxiety.) 30 tablet 2   Multiple Vitamin (MULTIVITAMIN WITH MINERALS) TABS tablet Take 1 tablet by mouth in the morning. (Patient not taking: Reported on 03/22/2021)     nitroGLYCERIN (NITRODUR - DOSED IN MG/24 HR) 0.2 mg/hr patch APPLY 1/4 PATCH DAILY TO TENDON FOR TENDONITIS. (Patient not taking: Reported on 03/22/2021) 30 patch 1   tamoxifen (NOLVADEX) 20 MG tablet Take 1 tablet by mouth daily. 90 tablet 4   venlafaxine XR (EFFEXOR-XR) 37.5 MG 24 hr capsule Take 1 capsule (37.5 mg total) by mouth daily with breakfast. 30 capsule 0   vitamin B-12 (CYANOCOBALAMIN) 1000 MCG tablet Take 1,000 mcg by mouth in the morning.     No current facility-administered medications on file prior to visit.    Allergies:  Allergies  Allergen Reactions   Iodine Itching    REACTION: Itching    Sulfonamide Derivatives Itching and Rash    REACTION: Itching Rash   Past Medical History:  Past Medical History:  Diagnosis Date   Allergy    Anxiety    Breast cancer ([place])    Depression    Hx of adenomatous polyp of colon 07/18/2016   Irritable bowel syndrome    OBESITY    Past Surgical History:  Past Surgical History:  Procedure Laterality Date   CESAREAN SECTION     x's 2   CHOLECYSTECTOMY  2009   ENT surgery  2004   MASTECTOMY W/ SENTINEL NODE BIOPSY Right 12/13/2020   Procedure: RIGHT MASTECTOMY WITH SENTINEL LYMPH NODE BIOPSY;  Surgeon: Coralie Keens, MD;  Location: [facility];  Service: General;  Laterality: Right;   Social History:  Social History   Socioeconomic History   Marital status: Married    Spouse name: Not on file   Number of children: 2   Years of education: Not on file   Highest education level: Not on file  Occupational History   Occupation: Community education officer    Employer: [employer]  Tobacco Use   Smoking status: Never   Smokeless tobacco: Never  Substance and Sexual Activity   Alcohol use: Yes    Alcohol/week: 2.0 standard drinks    Types: 2 Glasses of wine  per week    Comment: Rarely   Drug use: Yes    Types: Marijuana    Comment: once a month   Sexual activity: Not on file  Other Topics Concern   Not on file  Social History Narrative   Not on file   Social Determinants of Health   Financial Resource Strain: Low Risk    Difficulty of Paying Living Expenses: Not hard at all  Food Insecurity: No Food Insecurity   Worried About Programme researcher, broadcasting/film/video in the Last Year: Never true   Ran Out of Food in the Last Year: Never true  Transportation Needs: No Transportation Needs   Lack of Transportation (Medical): No   Lack of Transportation (Non-Medical): No  Physical Activity: Sufficiently Active   Days of Exercise per Week: 3 days   Minutes of Exercise per Session: 60 min  Stress: Stress Concern Present   Feeling of Stress  : To some extent  Social Connections: Unknown   Frequency of Communication with Friends and Family: More than three times a week   Frequency of Social Gatherings with Friends and Family: Twice a week   Attends Religious Services: Never   Database administrator or Organizations: Yes   Attends Engineer, structural: 1 to 4 times per year   Marital Status: Not on file  Intimate Partner Violence: Not At Risk   Fear of Current or Ex-Partner: No   Emotionally Abused: No   Physically Abused: No   Sexually Abused: No   Family History:  Family History  Problem Relation Age of Onset   Diabetes Mother    Hyperlipidemia Mother    Cancer Father        Angio sarcoma   Hyperlipidemia Father    Arthritis Maternal Grandmother    Lung cancer Maternal Grandmother    Lung cancer Maternal Grandfather    Lung cancer Paternal Grandmother    Arthritis Other    Colon cancer Neg Hx    Stomach cancer Neg Hx    Rectal cancer Neg Hx    Esophageal cancer Neg Hx    Liver cancer Neg Hx     Review of Systems: Constitutional: Doesn't report fevers, chills or abnormal weight loss Eyes: Doesn't report blurriness of vision Ears, nose, mouth, throat, and face: Doesn't report sore throat Respiratory: Doesn't report cough, dyspnea or wheezes Cardiovascular: Doesn't report palpitation, chest discomfort  Gastrointestinal:  Doesn't report nausea, constipation, diarrhea GU: Doesn't report incontinence Skin: Doesn't report skin rashes Neurological: Per HPI Musculoskeletal: Doesn't report joint pain Behavioral/Psych: Doesn't report anxiety  Physical Exam: Vitals:   04/09/21 1407  BP: 126/83  Pulse: (!) 111  Resp: 18  Temp: 99.1 F (37.3 C)  SpO2: 98%   KPS: 90. General: Alert, cooperative, pleasant, in no acute distress Head: Normal EENT: No conjunctival injection or scleral icterus.  Lungs: Resp effort normal Cardiac: Regular rate Abdomen: Non-distended abdomen Skin: No rashes cyanosis or  petechiae. Extremities: No clubbing or edema  Neurologic Exam: Mental Status: Awake, alert, attentive to examiner. Oriented to self and environment. Language is fluent with intact comprehension.  Cranial Nerves: Visual acuity is grossly normal. Visual fields are full. Extra-ocular movements intact. No ptosis. Face is symmetric Motor: Tone and bulk are normal. Power is full in both arms and legs. Reflexes are symmetric, no pathologic reflexes present.  Sensory: Impaired right V1,2,3 Gait: Normal.   Labs: I have reviewed the data as listed    Component Value Date/Time  NA 140 01/08/2021 1533   K 4.1 01/08/2021 1533   CL 104 01/08/2021 1533   CO2 26 01/08/2021 1533   GLUCOSE 86 01/08/2021 1533   BUN 7 01/08/2021 1533   CREATININE 0.67 01/08/2021 1533   CALCIUM 9.0 01/08/2021 1533   PROT 6.9 01/08/2021 1533   ALBUMIN 3.7 01/08/2021 1533   AST 17 01/08/2021 1533   ALT 15 01/08/2021 1533   ALKPHOS 52 01/08/2021 1533   BILITOT 0.3 01/08/2021 1533   GFRNONAA >60 01/08/2021 1533   GFRAA  05/23/2010 2050    >60        The eGFR has been calculated using the MDRD equation. This calculation has not been validated in all clinical situations. eGFR's persistently <60 mL/min signify possible Chronic Kidney Disease.   Lab Results  Component Value Date   WBC 8.5 01/08/2021   NEUTROABS 4.9 01/08/2021   HGB 12.3 01/08/2021   HCT 37.4 01/08/2021   MCV 97.7 01/08/2021   PLT 289 01/08/2021    Imaging:  CT HEAD WO CONTRAST (5MM)  Result Date: 03/25/2021 CLINICAL DATA:  Neuro deficit, acute, stroke suspected. Right-sided facial numbness, tingling, and dizziness since a mastectomy in 11/2020. EXAM: CT HEAD WITHOUT CONTRAST TECHNIQUE: Contiguous axial images were obtained from the base of the skull through the vertex without intravenous contrast. COMPARISON:  Head MRI 05/23/2010 FINDINGS: Brain: There is no evidence of an acute infarct, intracranial hemorrhage, mass, midline shift, or  extra-axial fluid collection. The ventricles and sulci are normal. Vascular: No hyperdense vessel. Skull: No fracture or suspicious osseous lesion. Sinuses/Orbits: Paranasal sinuses and mastoid air cells are clear. Unremarkable orbits. Other: None. IMPRESSION: Negative head CT. Electronically Signed   By: Logan Bores M.D.   On: 03/25/2021 16:33     Assessment/Plan Malignant neoplasm of upper-outer quadrant of right breast in female, estrogen receptor positive ([place]) - Plan: MR BRAIN W WO CONTRAST  Trigeminal neuropathy - Plan: MR BRAIN W WO CONTRAST  TIFFINEY SPARROW presents with clinical syndrome localizing to the right trigeminal nerve root or nucleus in the brainstem.  Etiology is unclear, risk factors include underlying neoplasm, some vascular/stroke factors.  It is uncertain why pressure applied to her limbs lead to exacerbation of sensory impairment.  We recommended starting with brain MRI study, with thin cuts through the brainstem if possible.    For neuropathic symptoms in arm, recommended trial of gabapentin $RemoveBefor'300mg'[id number]$  BID.     We spent twenty additional minutes teaching regarding the natural history, biology, and historical experience in the treatment of neurologic complications of cancer.   We appreciate the opportunity to participate in the care of [employer].  We will follow up with her after testing.   All questions were answered. The patient knows to call the clinic with any problems, questions or concerns. No barriers to learning were detected.  The total time spent in the encounter was 40 minutes and more than 50% was on counseling and review of test results   Ventura Sellers, MD Medical Director of Neuro-Oncology [facility] [place] 04/09/21 2:52 PM

## 2021-04-10 ENCOUNTER — Telehealth: Payer: Self-pay | Admitting: Internal Medicine

## 2021-04-10 ENCOUNTER — Other Ambulatory Visit (HOSPITAL_COMMUNITY): Payer: Self-pay

## 2021-04-10 MED ORDER — CARESTART COVID-19 HOME TEST VI KIT
PACK | 0 refills | Status: DC
  Filled 2021-04-10: qty 4, 4d supply, fill #0

## 2021-04-10 NOTE — Telephone Encounter (Signed)
Scheduled follow-up appointment per 1/16 los. Patient is aware.

## 2021-04-11 ENCOUNTER — Encounter: Payer: Self-pay | Admitting: Internal Medicine

## 2021-04-12 ENCOUNTER — Other Ambulatory Visit (HOSPITAL_COMMUNITY): Payer: Self-pay

## 2021-04-12 ENCOUNTER — Other Ambulatory Visit: Payer: Self-pay | Admitting: Internal Medicine

## 2021-04-12 MED ORDER — GABAPENTIN 300 MG PO CAPS
300.0000 mg | ORAL_CAPSULE | Freq: Two times a day (BID) | ORAL | 3 refills | Status: DC
  Filled 2021-04-12: qty 60, 30d supply, fill #0
  Filled 2021-06-18: qty 60, 30d supply, fill #1
  Filled 2021-08-05: qty 60, 30d supply, fill #2
  Filled 2021-10-13: qty 60, 30d supply, fill #3

## 2021-04-19 ENCOUNTER — Encounter: Payer: Self-pay | Admitting: Adult Health

## 2021-04-23 ENCOUNTER — Other Ambulatory Visit: Payer: Self-pay | Admitting: Radiation Therapy

## 2021-04-23 ENCOUNTER — Inpatient Hospital Stay: Payer: 59 | Admitting: Adult Health

## 2021-04-23 ENCOUNTER — Telehealth: Payer: Self-pay | Admitting: Nurse Practitioner

## 2021-04-23 NOTE — Telephone Encounter (Signed)
Sch per 1/30 secure chat, pt aware

## 2021-04-24 ENCOUNTER — Other Ambulatory Visit: Payer: Self-pay | Admitting: Nurse Practitioner

## 2021-04-24 ENCOUNTER — Inpatient Hospital Stay (HOSPITAL_BASED_OUTPATIENT_CLINIC_OR_DEPARTMENT_OTHER): Payer: 59 | Admitting: Adult Health

## 2021-04-24 ENCOUNTER — Other Ambulatory Visit (HOSPITAL_COMMUNITY): Payer: Self-pay

## 2021-04-24 DIAGNOSIS — Z170 Estrogen receptor positive status [ER+]: Secondary | ICD-10-CM

## 2021-04-24 DIAGNOSIS — C50411 Malignant neoplasm of upper-outer quadrant of right female breast: Secondary | ICD-10-CM | POA: Diagnosis not present

## 2021-04-24 MED ORDER — METHYLPREDNISOLONE ACETATE 40 MG/ML IJ SUSP: 40.0000 mg | Freq: Once | INTRAMUSCULAR | Status: DC

## 2021-04-24 MED ORDER — ANASTROZOLE 1 MG PO TABS
1.0000 mg | ORAL_TABLET | Freq: Every day | ORAL | 0 refills | Status: DC
  Filled 2021-04-24: qty 90, 90d supply, fill #0

## 2021-04-24 NOTE — Progress Notes (Signed)
Donna Robbins: °  ° °Crawford, Donna A, MD °[address] °[place] [address] ° ° °DIAGNOSIS:  Cancer Staging  °Malignant neoplasm of upper-outer quadrant of right breast in female, estrogen receptor positive (HCC) °Staging form: Breast, AJCC 8th Edition °- Clinical stage from 11/15/2020: Stage IA (cT1b, cN0, cM0, G1, ER+, PR+, HER2-) - Signed by Magrinat, Gustav C, MD on 11/15/2020 °Stage prefix: Initial diagnosis °Histologic grading system: 3 grade system °- Pathologic stage from 12/13/2020: Stage IA (pT1b, pN0, cM0, G1, ER+, PR+, HER2-) - Signed by Causey, Lindsey Cornetto, NP on 03/20/2021 °Stage prefix: Initial diagnosis °Histologic grading system: 3 grade system °I connected with Ysabel Buttrey on 04/26/21 at  2:15 PM EST by telephone and verified that I am speaking with the correct person using two identifiers.  °I discussed the limitations, risks, security and privacy concerns of performing an evaluation and management service by telephone and the availability of in person appointments.  °I also discussed with the patient that there may be Robbins patient responsible charge related to this service. The patient expressed understanding and agreed to proceed.  °Patient location: home °Provider location: [facility] office °Others participating in call: none ° °SUMMARY OF ONCOLOGIC HISTORY: °Oncology History  °Malignant neoplasm of upper-outer quadrant of right breast in female, estrogen receptor positive (HCC)  °11/06/2020 Mammogram  ° Exam: 3D Mammogram Diagnostic - Right °Breast Ultrasound - Right ° °IMPRESSION: °The 0.8 cm irregular mass in the right breast at 9 o'clock is highly suggestive of malignancy. °Ultrasound of the right axilla is negative for lymphadenopathy. °  °11/08/2020 Initial Biopsy  ° Diagnosis °Breast, right, needle core biopsy, 9 o'clock 10cm fn posterior depth °- INVASIVE DUCTAL CARCINOMA, GRADE 1/2. °- DUCTAL CARCINOMA IN SITU. °- SEE MICROSCOPIC  DESCRIPTION. ° °Microscopic Comment °The greatest tumor dimension is 0.5 cm. ° °PROGNOSTIC INDICATORS °Results: °The tumor cells are NEGATIVE for Her2 (0). °Estrogen Receptor: 100%, POSITIVE, STRONG STAINING INTENSITY °Progesterone Receptor: 100%, POSITIVE, STRONG STAINING INTENSITY °Proliferation Marker Ki67: 1% °  °11/13/2020 Initial Diagnosis  ° Malignant neoplasm of upper-outer quadrant of right breast in female, estrogen receptor positive (HCC) °  °11/15/2020 Cancer Staging  ° Staging form: Breast, AJCC 8th Edition °- Clinical stage from 11/15/2020: Stage IA (cT1b, cN0, cM0, G1, ER+, PR+, HER2-) - Signed by Magrinat, Gustav C, MD on 11/15/2020 °Stage prefix: Initial diagnosis °Histologic grading system: 3 grade system ° °  °12/13/2020 Cancer Staging  ° Staging form: Breast, AJCC 8th Edition °- Pathologic stage from 12/13/2020: Stage IA (pT1b, pN0, cM0, G1, ER+, PR+, HER2-) - Signed by Causey, Lindsey Cornetto, NP on 03/20/2021 °Stage prefix: Initial diagnosis °Histologic grading system: 3 grade system ° °  °12/13/2020 Definitive Surgery  ° FINAL MICROSCOPIC DIAGNOSIS:  ° °Robbins. BREAST, RIGHT, MASTECTOMY:  °- Invasive and in situ ductal carcinoma, 0.9 cm.  °- Margins not involved.  °- Biopsy site and biopsy clip.  °- See oncology table.  ° °B. LYMPH NODE, RIGHT AXILLARY #1, SENTINEL, EXCISION:  °- One lymph node negative for metastatic carcinoma (0/1).  ° °C. LYMPH NODE, RIGHT AXILLARY #2, SENTINEL, EXCISION:  °- One lymph node negative for metastatic carcinoma (0/1).  ° °D. LYMPH NODE, RIGHT AXILLARY, SENTINEL, EXCISION:  °- One lymph node negative for metastatic carcinoma (0/1).  ° °E. LYMPH NODE, RIGHT AXILLARY, SENTINEL, EXCISION:  °- One lymph node negative for metastatic carcinoma (0/1).  ° °F. LYMPH NODE, RIGHT AXILLARY, SENTINEL, EXCISION:  °- One lymph node negative for metastatic   carcinoma (0/1).  °  °12/13/2020 Oncotype testing  ° Oncotype DX was obtained on the final surgical sample and the recurrence score of  8 predicts Robbins risk of recurrence outside the breast over the next 9 years of 3%, if the patient's only systemic therapy is an antiestrogen for 5 years.  It also predicts Robbins <1% benefit from chemotherapy. °  °01/2021 -  Anti-estrogen oral therapy  ° Tamoxifen daily °  ° ° °CURRENT THERAPY: Not taking currently ° °INTERVAL HISTORY: °Donna Robbins 50 y.o. female returns for evaluation to discuss anti estrogen therapy.  She has not taken tamoxifen since 12/29 and knows that she needs to restart.  She wants to know what her options are for zoladex and AET versus tamoxifen.  She tells me that she is following Robbins with Dr. Vaslow about some of her neurological concerns and with Nikki about the pain in her arm. ° ° °Patient Active Problem List  ° Diagnosis Date Noted  ° Post-mastectomy pain syndrome 04/25/2021  ° Cervico-occipital neuralgia of right side 04/25/2021  ° Trigger point with neuralgia 04/25/2021  ° Trigeminal neuropathy 04/09/2021  ° S/P mastectomy, right 12/13/2020  ° Malignant neoplasm of upper-outer quadrant of right breast in female, estrogen receptor positive (HCC) 11/13/2020  ° Chronic pain of left knee 04/15/2017  ° Hx of adenomatous polyp of colon 07/18/2016  ° Ventral hernia 11/15/2015  ° Routine general medical examination [facility] 11/24/2014  ° Anxiety   ° HEARING LOSS 03/14/2010  ° Obesity 03/06/2010  ° IRRITABLE BOWEL SYNDROME 03/06/2010  ° ° °is allergic to iodine and sulfonamide derivatives. ° °MEDICAL HISTORY: °Past Medical History:  °Diagnosis Date  ° Allergy   ° Anxiety   ° Breast cancer (HCC)   ° Depression   ° Hx of adenomatous polyp of colon 07/18/2016  ° Irritable bowel syndrome   ° OBESITY   ° ° °SURGICAL HISTORY: °Past Surgical History:  °Procedure Laterality Date  ° CESAREAN SECTION    ° x's 2  ° CHOLECYSTECTOMY  2009  ° ENT surgery  2004  ° MASTECTOMY W/ SENTINEL NODE BIOPSY Right 12/13/2020  ° Procedure: RIGHT MASTECTOMY WITH SENTINEL LYMPH NODE BIOPSY;  Surgeon:  Blackman, Douglas, MD;  Location: [facility] OR;  Service: General;  Laterality: Right;  ° ° °SOCIAL HISTORY: °Social History  ° °Socioeconomic History  ° Marital status: Married  °  Spouse name: Not on file  ° Number of children: 2  ° Years of education: Not on file  ° Highest education level: Not on file  °Occupational History  ° Occupation: CLINIC SITE MANAGER  °  Employer: [employer]  °Tobacco Use  ° Smoking status: Never  ° Smokeless tobacco: Never  °Substance and Sexual Activity  ° Alcohol use: Yes  °  Alcohol/week: 2.0 standard drinks  °  Types: 2 Glasses of wine per week  °  Comment: Rarely  ° Drug use: Yes  °  Types: Marijuana  °  Comment: once Robbins month  ° Sexual activity: Not on file  °Other Topics Concern  ° Not on file  °Social History Narrative  ° Not on file  ° °Social Determinants of Health  ° °Financial Resource Strain: Low Risk   ° Difficulty of Paying Living Expenses: Not hard at all  °Food Insecurity: No Food Insecurity  ° Worried About Running Out of Food in the Last Year: Never true  ° Ran Out of Food in the Last Year: Never   true  Transportation Needs: No Transportation Needs   Lack of Transportation (Medical): No   Lack of Transportation (Non-Medical): No  Physical Activity: Sufficiently Active   Days of Exercise per Week: 3 days   Minutes of Exercise per Session: 60 min  Stress: Stress Concern Present   Feeling of Stress : To some extent  Social Connections: Unknown   Frequency of Communication with Friends and Family: More than three times Robbins week   Frequency of Social Gatherings with Friends and Family: Twice Robbins week   Attends Religious Services: Never   Marine scientist or Organizations: Yes   Attends Music therapist: 1 to 4 times per year   Marital Status: Not on file  Intimate Partner Violence: Not At Risk   Fear of Current or Ex-Partner: No   Emotionally Abused: No   Physically Abused: No   Sexually Abused: No    FAMILY HISTORY: Family  History  Problem Relation Age of Onset   Diabetes Mother    Hyperlipidemia Mother    Cancer Father        Angio sarcoma   Hyperlipidemia Father    Arthritis Maternal Grandmother    Lung cancer Maternal Grandmother    Lung cancer Maternal Grandfather    Lung cancer Paternal Grandmother    Arthritis Other    Colon cancer Neg Hx    Stomach cancer Neg Hx    Rectal cancer Neg Hx    Esophageal cancer Neg Hx    Liver cancer Neg Hx     Review of Systems  Constitutional:  Negative for appetite change, chills, fatigue, fever and unexpected weight change.  HENT:   Negative for hearing loss, lump/mass and trouble swallowing.   Eyes:  Negative for eye problems and icterus.  Respiratory:  Negative for chest tightness, cough and shortness of breath.   Cardiovascular:  Negative for chest pain, leg swelling and palpitations.  Gastrointestinal:  Negative for abdominal distention, abdominal pain, constipation, diarrhea, nausea and vomiting.  Endocrine: Negative for hot flashes.  Genitourinary:  Negative for difficulty urinating.   Musculoskeletal:  Negative for arthralgias.  Skin:  Negative for itching and rash.  Neurological:  Negative for dizziness, extremity weakness, headaches and numbness.  Hematological:  Negative for adenopathy. Does not bruise/bleed easily.  Psychiatric/Behavioral:  Negative for depression. The patient is not nervous/anxious.      PHYSICAL EXAMINATION  Patient sounds well she is in no apparent distress mood and behavior normal breathing is nonlabored.  LABORATORY DATA:  CBC    Component Value Date/Time   WBC 8.5 01/08/2021 1533   WBC 8.6 12/14/2020 0401   RBC 3.83 (L) 01/08/2021 1533   HGB 12.3 01/08/2021 1533   HCT 37.4 01/08/2021 1533   PLT 289 01/08/2021 1533   MCV 97.7 01/08/2021 1533   MCH 32.1 01/08/2021 1533   MCHC 32.9 01/08/2021 1533   RDW 13.4 01/08/2021 1533   LYMPHSABS 2.1 01/08/2021 1533   MONOABS 0.7 01/08/2021 1533   EOSABS 0.7 (H)  01/08/2021 1533   BASOSABS 0.1 01/08/2021 1533    CMP     Component Value Date/Time   NA 140 01/08/2021 1533   K 4.1 01/08/2021 1533   CL 104 01/08/2021 1533   CO2 26 01/08/2021 1533   GLUCOSE 86 01/08/2021 1533   BUN 7 01/08/2021 1533   CREATININE 0.67 01/08/2021 1533   CALCIUM 9.0 01/08/2021 1533   PROT 6.9 01/08/2021 1533   ALBUMIN 3.7 01/08/2021 1533   AST  17 01/08/2021 1533  ° ALT 15 01/08/2021 1533  ° ALKPHOS 52 01/08/2021 1533  ° BILITOT 0.3 01/08/2021 1533  ° GFRNONAA >60 01/08/2021 1533  ° GFRAA  05/23/2010 2050  °  >60        °The eGFR has been calculated °using the MDRD equation. °This calculation has not been °validated in all clinical °situations. °eGFR's persistently °<60 mL/min signify °possible Chronic Kidney Disease.  ° ° ° °ASSESSMENT and PLAN:  ° °Malignant neoplasm of upper-outer quadrant of right breast in female, estrogen receptor positive (HCC) °Donna Robbins is Robbins 50-year-old female with stage Ia right-sided breast cancer, estrogen positive status postmastectomy and unable to tolerate tamoxifen daily. ° °1.  Right-sided breast cancer: Donna Robbins and I discussed her restarting antiestrogen therapy.  She is tried tamoxifen twice with significant challenges with brain fog.  We reviewed Zoladex and anastrozole.  I discussed risks and benefits and she has opted to proceed with this.  She will receive an injection of Zoladex once every 4 weeks, I sent anastrozole into her pharmacy.  She understands she needs to take both. ° °2.  Trigeminal neuralgia: She is following Robbins with Dr. Vaslow about this and is undergoing brain MRI on February 6. ° °3.  Postmastectomy pain: She is seeing our palliative care nurse practitioner for management of this issue. ° °I have placed orders for the Zoladex.  Donna Robbins will start this next week.  She has follow-Robbins with Dr. Gudena in June. ° ° °Follow Robbins instructions:  °  °-Return to cancer center next week to start Zoladex, then every 4 weeks °-f/u with Dr. Gudena 08/2021   ° ° ° °The patient was provided an opportunity to ask questions and all were answered. The patient agreed with the plan and demonstrated an understanding of the instructions.  ° °The patient was advised to call back or seek an in-person evaluation if the symptoms worsen or if the condition fails to improve as anticipated.  ° °I provided 21 minutes of face-to-face video visit time during this encounter, and > 50% was spent counseling as documented under my assessment & plan. ° ° °Lindsey Causey, NP 04/26/21 8:50 AM °Medical Oncology and Hematology °[facility] °[address] °[place], [place] [address] °Tel. [phone]    Fax. [phone] ° °*Total Encounter Time as defined by the Centers for Medicare and Medicaid Services includes, in addition to the face-to-face time of Robbins patient visit (documented in the note above) non-face-to-face time: obtaining and reviewing outside history, ordering and reviewing medications, tests or procedures, care coordination (communications with other health care professionals or caregivers) and documentation in the medical record. ° °

## 2021-04-24 NOTE — Addendum Note (Signed)
Addended by: Margaret Pyle on: 04/24/2021 01:33 PM   Modules accepted: Orders

## 2021-04-25 ENCOUNTER — Other Ambulatory Visit (HOSPITAL_COMMUNITY): Payer: Self-pay

## 2021-04-25 ENCOUNTER — Inpatient Hospital Stay: Payer: 59 | Attending: Internal Medicine | Admitting: Nurse Practitioner

## 2021-04-25 ENCOUNTER — Other Ambulatory Visit: Payer: Self-pay

## 2021-04-25 DIAGNOSIS — G8928 Other chronic postprocedural pain: Secondary | ICD-10-CM | POA: Diagnosis not present

## 2021-04-25 DIAGNOSIS — G508 Other disorders of trigeminal nerve: Secondary | ICD-10-CM | POA: Insufficient documentation

## 2021-04-25 DIAGNOSIS — Z79818 Long term (current) use of other agents affecting estrogen receptors and estrogen levels: Secondary | ICD-10-CM | POA: Insufficient documentation

## 2021-04-25 DIAGNOSIS — Z9011 Acquired absence of right breast and nipple: Secondary | ICD-10-CM | POA: Insufficient documentation

## 2021-04-25 DIAGNOSIS — M792 Neuralgia and neuritis, unspecified: Secondary | ICD-10-CM | POA: Diagnosis not present

## 2021-04-25 DIAGNOSIS — M5481 Occipital neuralgia: Secondary | ICD-10-CM | POA: Insufficient documentation

## 2021-04-25 DIAGNOSIS — C50411 Malignant neoplasm of upper-outer quadrant of right female breast: Secondary | ICD-10-CM | POA: Insufficient documentation

## 2021-04-25 NOTE — Progress Notes (Deleted)
@PC @                                                                                                                                                       Daily Progress Note   Patient Name: Donna Robbins       Date: 04/25/2021 DOB: 1970-10-31  Age: 51 y.o. MRN#: [id number] Attending Physician: Loma Sender* Primary Care Physician: Hoyt Koch, MD Admit Date: (Not on file)  Reason for Consultation/Follow-up: {Reason for Consult:23484}  Subjective: ***  Length of Stay: 0  Current Medications: Scheduled Meds:    Continuous Infusions:   PRN Meds:   Physical Exam          Vital Signs: BP 130/78 (BP Location: Left Arm, Patient Position: Sitting)    Pulse 75    Temp 98.3 F (36.8 C) (Oral)    Resp 16    Wt 177 lb (80.3 kg)    SpO2 100%    BMI 31.35 kg/m  SpO2: SpO2: 100 % O2 Device:   O2 Flow Rate:    Intake/output summary: @IOBRIEF @ LBM:   Baseline Weight: Weight: 177 lb (80.3 kg) Most recent weight: Weight: 177 lb (80.3 kg)       Palliative Assessment/Data:      Patient Active Problem List   Diagnosis Date Noted   Post-mastectomy pain syndrome 04/25/2021   Cervico-occipital neuralgia of right side 04/25/2021   Trigger point with neuralgia 04/25/2021   Trigeminal neuropathy 04/09/2021   S/P mastectomy, right 12/13/2020   Malignant neoplasm of upper-outer quadrant of right breast in female, estrogen receptor positive ([place]) 11/13/2020   Chronic pain of left knee 04/15/2017   Hx of adenomatous polyp of colon 07/18/2016   Ventral hernia 11/15/2015   Routine general medical examination at a health care facility 11/24/2014   Anxiety    HEARING LOSS 03/14/2010   Obesity 03/06/2010   IRRITABLE BOWEL SYNDROME 03/06/2010    Palliative Care Assessment & Plan   Patient Profile: ***  Assessment: ***  Recommendations/Plan: ***  Goals of Care and Additional  Recommendations: Limitations on Scope of Treatment: {Recommended Scope and Preferences:21019}  Code Status: Code Status History     Date Active Date Inactive Code Status Order ID Comments User Context   12/13/2020 1009 12/14/2020 1425 Full Code [id number]  Coralie Keens, MD Inpatient       Prognosis:  {Palliative Care Prognosis:23504}  Discharge Planning: {Palliative dispostion:23505}  Care plan was discussed with ***  Thank you for allowing the Palliative Medicine Team to assist in  the care of this patient.   Time In: *** Time Out: *** Total Time *** Prolonged Time Billed  {YES NO:22349}       Greater than 50%  of this time was spent counseling and coordinating care related to the above assessment and plan.  Lane Hacker, DO  Please contact Palliative Medicine Team phone at [phone] for questions and concerns.

## 2021-04-25 NOTE — Progress Notes (Signed)
New Patient Office Visit PALLIATIVE CARE- SYMPTOM MANAGEMENT VISIT  Subjective:  Patient ID: Donna Robbins, female    DOB: 1970/08/16  Age: 51 y.o. MRN: [id number]  CC: Neuropathic pain, sensory abnormalities involving the right axilla outer arm lateral neck and lower face on the right.   HPI Donna Robbins "Donna Robbins" is a 51 yo woman who is status post right mastectomy for early stage breast cancer on 12/13/20, negative margins and nodes who has been released by surgery and is being followed in breast center survivorship program. She has had ongoing issues with paraesthesias and unusual sensory symptoms that started after surgery.  She describes pain as "burning", "radiating", "cold", "numb" and in general difficult to localize or consistently reproduce. The sensations of discomfort are located in her axilla, along the inferior breast fold, around site of drain scar, additionally she has a cold sensation of the very lateral aspect of her arm sparing her wrist and hand generally. She has no loss of motor function, but is increasingly feeling that her shoulder is "freezing up" and that her muscles are feeling very stiff, sometimes spasm like feeling that goes from her upper back through her neck and head. She also has numbness of her lower right face and around her right ear. She has seen Dr. Mickeal Skinner and has an MRI of her Brain scheduled for tomorrow. Donna Robbins was open to seeing palliative care for management of her symptoms and additional support.  In addition to her right chest, arm, neck and head symptoms she has been experiencing some minor issues with cognition. She has had difficulty tolerating tamoxifen or other hormonal treatments. No changes in her hearing or vision or focal neurological findings except for sensory issues described above.  Past Medical History:  Diagnosis Date   Allergy    Anxiety    Breast cancer ([place])    Depression    Hx of adenomatous polyp of colon 07/18/2016    Irritable bowel syndrome    OBESITY     Past Surgical History:  Procedure Laterality Date   CESAREAN SECTION     x's 2   CHOLECYSTECTOMY  2009   ENT surgery  2004   MASTECTOMY W/ SENTINEL NODE BIOPSY Right 12/13/2020   Procedure: RIGHT MASTECTOMY WITH SENTINEL LYMPH NODE BIOPSY;  Surgeon: Coralie Keens, MD;  Location: [facility];  Service: General;  Laterality: Right;    Family History  Problem Relation Age of Onset   Diabetes Mother    Hyperlipidemia Mother    Cancer Father        Angio sarcoma   Hyperlipidemia Father    Arthritis Maternal Grandmother    Lung cancer Maternal Grandmother    Lung cancer Maternal Grandfather    Lung cancer Paternal Grandmother    Arthritis Other    Colon cancer Neg Hx    Stomach cancer Neg Hx    Rectal cancer Neg Hx    Esophageal cancer Neg Hx    Liver cancer Neg Hx     Social History   Socioeconomic History   Marital status: Married    Spouse name: Not on file   Number of children: 2   Years of education: Not on file   Highest education level: Not on file  Occupational History   Occupation: Community education officer    Employer: [employer]  Tobacco Use   Smoking status: Never   Smokeless tobacco: Never  Substance and Sexual Activity   Alcohol use: Yes  Alcohol/week: 2.0 standard drinks    Types: 2 Glasses of wine per week    Comment: Rarely   Drug use: Yes    Types: Marijuana    Comment: once a month   Sexual activity: Not on file  Other Topics Concern   Not on file  Social History Narrative   Not on file   Social Determinants of Health   Financial Resource Strain: Low Risk    Difficulty of Paying Living Expenses: Not hard at all  Food Insecurity: No Food Insecurity   Worried About Programme researcher, broadcasting/film/video in the Last Year: Never true   Ran Out of Food in the Last Year: Never true  Transportation Needs: No Transportation Needs   Lack of Transportation (Medical): No   Lack  of Transportation (Non-Medical): No  Physical Activity: Sufficiently Active   Days of Exercise per Week: 3 days   Minutes of Exercise per Session: 60 min  Stress: Stress Concern Present   Feeling of Stress : To some extent  Social Connections: Unknown   Frequency of Communication with Friends and Family: More than three times a week   Frequency of Social Gatherings with Friends and Family: Twice a week   Attends Religious Services: Never   Database administrator or Organizations: Yes   Attends Engineer, structural: 1 to 4 times per year   Marital Status: Not on file  Intimate Partner Violence: Not At Risk   Fear of Current or Ex-Partner: No   Emotionally Abused: No   Physically Abused: No   Sexually Abused: No    Outpatient Medications Prior to Visit  Medication Sig Dispense Refill   gabapentin (NEURONTIN) 300 MG capsule Take 1 capsule (300 mg total) by mouth 2 (two) times daily. 60 capsule 3   acetaminophen (TYLENOL) 500 MG tablet Take 1,000 mg by mouth 2 (two) times daily as needed (joint/muscle pain.).     anastrozole (ARIMIDEX) 1 MG tablet Take 1 tablet by mouth daily. 90 tablet 0   cholecalciferol (VITAMIN D) 1000 units tablet Take 1,000 Units by mouth in the morning.     clonazePAM (KLONOPIN) 0.5 MG tablet TAKE 1/2 TO 1 TABLET BY MOUTH DAILY AS NEEDED FOR ANXIETY (Patient taking differently: Take 0.25 mg by mouth daily as needed for anxiety.) 30 tablet 2   COVID-19 At Home Antigen Test (CARESTART COVID-19 HOME TEST) KIT Use as directed. 4 each 0   Multiple Vitamin (MULTIVITAMIN WITH MINERALS) TABS tablet Take 1 tablet by mouth in the morning. (Patient not taking: Reported on 03/22/2021)     nitroGLYCERIN (NITRODUR - DOSED IN MG/24 HR) 0.2 mg/hr patch APPLY 1/4 PATCH DAILY TO TENDON FOR TENDONITIS. (Patient not taking: Reported on 03/22/2021) 30 patch 1   venlafaxine XR (EFFEXOR-XR) 37.5 MG 24 hr capsule Take 1 capsule (37.5 mg total) by mouth daily  with breakfast. 30 capsule 0   vitamin B-12 (CYANOCOBALAMIN) 1000 MCG tablet Take 1,000 mcg by mouth in the morning.     No facility-administered medications prior to visit.    Allergies  Allergen Reactions   Iodine Itching    REACTION: Itching   Sulfonamide Derivatives Itching and Rash    REACTION: Itching Rash    ROS Review of Systems Per HPI Narrative.   Objective:    Physical Exam Eyes:     General: No visual field deficit. Neck:   Chest:     Chest wall: Tenderness present.  Breasts:    Right: Absent.  Comments: Injection site Musculoskeletal:     Cervical back: Full passive range of motion without pain, normal range of motion and neck supple. No edema, erythema, signs of trauma, rigidity, torticollis or crepitus. Muscular tenderness present. No pain with movement or spinous process tenderness. Normal range of motion.       Back:  Lymphadenopathy:     Upper Body:     Right upper body: No axillary adenopathy.  Neurological:     Cranial Nerves: Cranial nerves 2-12 are intact. No dysarthria or facial asymmetry.     Deep Tendon Reflexes: Reflexes are normal and symmetric.   Focused examination including dermatomal distribution of her symptom of numbness and reproduction of radiating pain. Symptoms reproduced by light touch on the outer cutaneous aspect of her right ear-exquisitely tender on gentle palpation. Extreme sensitivity to light touch along the lower right mastectomy incision and drain scar.    BP 130/78 (BP Location: Left Arm, Patient Position: Sitting)    Pulse 75    Temp 98.3 F (36.8 C) (Oral)    Resp 16    Wt 177 lb (80.3 kg)    SpO2 100%    BMI 31.35 kg/m  Wt Readings from Last 3 Encounters:  04/25/21 177 lb (80.3 kg)  04/09/21 172 lb 2 oz (78.1 kg)  02/07/21 171 lb 3.2 oz (77.7 kg)     Health Maintenance Due  Topic Date Due   HIV Screening  Never done   Hepatitis C Screening  Never done   COVID-19 Vaccine (4 - Booster for Pfizer  series) 05/08/2020   PAP SMEAR-Modifier  11/18/2020    There are no preventive care reminders to display for this patient.  Lab Results  Component Value Date   TSH 1.51 08/01/2017   Lab Results  Component Value Date   WBC 8.5 01/08/2021   HGB 12.3 01/08/2021   HCT 37.4 01/08/2021   MCV 97.7 01/08/2021   PLT 289 01/08/2021   Lab Results  Component Value Date   NA 140 01/08/2021   K 4.1 01/08/2021   CO2 26 01/08/2021   GLUCOSE 86 01/08/2021   BUN 7 01/08/2021   CREATININE 0.67 01/08/2021   BILITOT 0.3 01/08/2021   ALKPHOS 52 01/08/2021   AST 17 01/08/2021   ALT 15 01/08/2021   PROT 6.9 01/08/2021   ALBUMIN 3.7 01/08/2021   CALCIUM 9.0 01/08/2021   ANIONGAP 10 01/08/2021   GFR 104.26 02/07/2020   Lab Results  Component Value Date   CHOL 144 02/07/2020   Lab Results  Component Value Date   HDL 49.00 02/07/2020   Lab Results  Component Value Date   LDLCALC 82 02/07/2020   Lab Results  Component Value Date   TRIG 65.0 02/07/2020   Lab Results  Component Value Date   CHOLHDL 3 02/07/2020   Lab Results  Component Value Date   HGBA1C 5.7 02/07/2020      Assessment & Plan:   Problem List Items Addressed This Visit     Post-mastectomy pain syndrome   Cervico-occipital neuralgia of right side   Trigger point with neuralgia   Symptoms are consistent with post-mastectomy neuropathic pain syndrome. Fortunately, Donna Robbins is not having pain predominant symptoms, but more paresthesias and cutaneous hypersensitives-but these are wearing on her and have associated discomfort. In order to minimize progression to chronic neuropathy, minimize her risk of Reflex Sympathetic Dystrophy or CRPS,  as well minimizing risk of losing upper extremity function (frozen shoulder, myofascial trigger points, muscle atrophy) due  to guarding, limiting use and discomfort, she will need ongoing therapies to include use of manual massage, manipulation, TENS unit, stretching and other physical  therapies on a regular basis.  PERFORMED DIAGNOSTIC AND THERAPEUTIC TRIGGER POINT INJECTIONS/LOCAL CUTANEOUS NERVE BLOCKS DURING HER VISIT TODAY. Located two potential areas for treatment today, verbal consent obtained to provide soft tissue injections in the region of her lateral mastectomy drain scar, and local block and steroid injection of her Cervico-Auricular nerve point. A 1:4 mix of depo-medrol $RemoveBefore'40mg'[id number]$ /ml (0.5 ml)-lidocaine 1% (7ml) was injected without complication or difficulty into the area of her: Posterior auricular nerve tender point in her lateral right neck Directly into the muscle and cutaneous region of the right lateral midline chest wall.  Patient reported immediate relief of hypersensitivity involving her neck, right face and ear symptoms with posterior auricular injection. Minor improvement in local area in the chest wall but she may need interventional nerve block done under ultrasound to visualize the T3-T4 lateral cutaneous nerve roots and branches if discomfort persists.  Can recommend other adjuvant medications for hyperalgesia such as topical lidocaine, Voltaren gel and additional soft tissue injections as needed.    Follow-up: AS NEEDED   Lane Hacker, DOPatient ID: [name], female   DOB: 02-04-1971, 51 y.o.   MRN: [id number]

## 2021-04-26 ENCOUNTER — Ambulatory Visit (HOSPITAL_COMMUNITY)
Admission: RE | Admit: 2021-04-26 | Discharge: 2021-04-26 | Disposition: A | Discharge status: Home / Self Care | Payer: 59 | Source: Ambulatory Visit | Attending: Internal Medicine | Admitting: Internal Medicine

## 2021-04-26 ENCOUNTER — Encounter: Payer: Self-pay | Admitting: Adult Health

## 2021-04-26 DIAGNOSIS — Z170 Estrogen receptor positive status [ER+]: Secondary | ICD-10-CM | POA: Diagnosis not present

## 2021-04-26 DIAGNOSIS — C50919 Malignant neoplasm of unspecified site of unspecified female breast: Secondary | ICD-10-CM | POA: Diagnosis not present

## 2021-04-26 DIAGNOSIS — C50411 Malignant neoplasm of upper-outer quadrant of right female breast: Secondary | ICD-10-CM | POA: Diagnosis not present

## 2021-04-26 DIAGNOSIS — G509 Disorder of trigeminal nerve, unspecified: Secondary | ICD-10-CM | POA: Diagnosis not present

## 2021-04-26 DIAGNOSIS — H40013 Open angle with borderline findings, low risk, bilateral: Secondary | ICD-10-CM | POA: Diagnosis not present

## 2021-04-26 MED ORDER — GADOBUTROL 1 MMOL/ML IV SOLN
8.0000 mL | Freq: Once | INTRAVENOUS | Status: AC | PRN
  Administered 2021-04-26: 8 mL via INTRAVENOUS

## 2021-04-26 NOTE — Assessment & Plan Note (Signed)
Evolet is a 51 year old female with stage Ia right-sided breast cancer, estrogen positive status postmastectomy and unable to tolerate tamoxifen daily.  1.  Right-sided breast cancer: Delsa Sale and I discussed her restarting antiestrogen therapy.  She is tried tamoxifen twice with significant challenges with brain fog.  We reviewed Zoladex and anastrozole.  I discussed risks and benefits and she has opted to proceed with this.  She will receive an injection of Zoladex once every 4 weeks, I sent anastrozole into her pharmacy.  She understands she needs to take both.  2.  Trigeminal neuralgia: She is following up with Dr. Mickeal Skinner about this and is undergoing brain MRI on February 6.  3.  Postmastectomy pain: She is seeing our palliative care nurse practitioner for management of this issue.  I have placed orders for the Zoladex.  Delsa Sale will start this next week.  She has follow-up with Dr. Lindi Adie in June.

## 2021-04-30 ENCOUNTER — Other Ambulatory Visit: Payer: Self-pay

## 2021-04-30 ENCOUNTER — Encounter: Payer: Self-pay | Admitting: Adult Health

## 2021-04-30 ENCOUNTER — Inpatient Hospital Stay: Payer: 59 | Admitting: Internal Medicine

## 2021-04-30 ENCOUNTER — Inpatient Hospital Stay: Payer: 59

## 2021-04-30 VITALS — BP 114/84 | HR 80 | Temp 98.7°F | Resp 16 | Ht 63.0 in | Wt 174.9 lb

## 2021-04-30 DIAGNOSIS — C50411 Malignant neoplasm of upper-outer quadrant of right female breast: Secondary | ICD-10-CM | POA: Diagnosis not present

## 2021-04-30 DIAGNOSIS — G508 Other disorders of trigeminal nerve: Secondary | ICD-10-CM | POA: Diagnosis not present

## 2021-04-30 DIAGNOSIS — Z9011 Acquired absence of right breast and nipple: Secondary | ICD-10-CM | POA: Diagnosis not present

## 2021-04-30 DIAGNOSIS — Z79818 Long term (current) use of other agents affecting estrogen receptors and estrogen levels: Secondary | ICD-10-CM | POA: Diagnosis not present

## 2021-04-30 DIAGNOSIS — G509 Disorder of trigeminal nerve, unspecified: Secondary | ICD-10-CM

## 2021-04-30 DIAGNOSIS — M792 Neuralgia and neuritis, unspecified: Secondary | ICD-10-CM | POA: Diagnosis not present

## 2021-04-30 DIAGNOSIS — Z170 Estrogen receptor positive status [ER+]: Secondary | ICD-10-CM

## 2021-04-30 DIAGNOSIS — M5481 Occipital neuralgia: Secondary | ICD-10-CM | POA: Diagnosis not present

## 2021-04-30 MED ORDER — GOSERELIN ACETATE 3.6 MG ~~LOC~~ IMPL
3.6000 mg | DRUG_IMPLANT | Freq: Once | SUBCUTANEOUS | Status: AC
  Administered 2021-04-30: 3.6 mg via SUBCUTANEOUS
  Filled 2021-04-30: qty 3.6

## 2021-04-30 NOTE — Progress Notes (Signed)
[facility] at [facility] [address]  [place], [place] [address] [phone]   Interval Evaluation  Date of Service: 04/30/21 Patient Name: Donna Robbins Patient MRN: [id number] Patient DOB: 06/06/1970 Provider: Henreitta Leber, MD  Identifying Statement:  Donna Robbins is a 51 y.o. female with Trigeminal neuropathy, cervical radiculopathy   Primary Cancer:  Oncologic History: Oncology History  Malignant neoplasm of upper-outer quadrant of right breast in female, estrogen receptor positive (HCC)  11/06/2020 Mammogram   Exam: 3D Mammogram Diagnostic - Right Breast Ultrasound - Right  IMPRESSION: The 0.8 cm irregular mass in the right breast at 9 o'clock is highly suggestive of malignancy. Ultrasound of the right axilla is negative for lymphadenopathy.   11/08/2020 Initial Biopsy   Diagnosis Breast, right, needle core biopsy, 9 o'clock 10cm fn posterior depth - INVASIVE DUCTAL CARCINOMA, GRADE 1/2. - DUCTAL CARCINOMA IN SITU. - SEE MICROSCOPIC DESCRIPTION.  Microscopic Comment The greatest tumor dimension is 0.5 cm.  PROGNOSTIC INDICATORS Results: The tumor cells are NEGATIVE for Her2 (0). Estrogen Receptor: 100%, POSITIVE, STRONG STAINING INTENSITY Progesterone Receptor: 100%, POSITIVE, STRONG STAINING INTENSITY Proliferation Marker Ki67: 1%   11/13/2020 Initial Diagnosis   Malignant neoplasm of upper-outer quadrant of right breast in female, estrogen receptor positive (HCC)   11/15/2020 Cancer Staging   Staging form: Breast, AJCC 8th Edition - Clinical stage from 11/15/2020: Stage IA (cT1b, cN0, cM0, G1, ER+, PR+, HER2-) - Signed by Lowella Dell, MD on 11/15/2020 Stage prefix: Initial diagnosis Histologic grading system: 3 grade system    12/13/2020 Cancer Staging   Staging form: Breast, AJCC 8th Edition - Pathologic stage from 12/13/2020: Stage IA (pT1b, pN0, cM0, G1, ER+, PR+, HER2-) - Signed by Loa Socks, NP on 03/20/2021 Stage prefix: Initial diagnosis Histologic grading system: 3 grade system    12/13/2020 Definitive Surgery   FINAL MICROSCOPIC DIAGNOSIS:   A. BREAST, RIGHT, MASTECTOMY:  - Invasive and in situ ductal carcinoma, 0.9 cm.  - Margins not involved.  - Biopsy site and biopsy clip.  - See oncology table.   B. LYMPH NODE, RIGHT AXILLARY #1, SENTINEL, EXCISION:  - One lymph node negative for metastatic carcinoma (0/1).   C. LYMPH NODE, RIGHT AXILLARY #2, SENTINEL, EXCISION:  - One lymph node negative for metastatic carcinoma (0/1).   D. LYMPH NODE, RIGHT AXILLARY, SENTINEL, EXCISION:  - One lymph node negative for metastatic carcinoma (0/1).   E. LYMPH NODE, RIGHT AXILLARY, SENTINEL, EXCISION:  - One lymph node negative for metastatic carcinoma (0/1).   F. LYMPH NODE, RIGHT AXILLARY, SENTINEL, EXCISION:  - One lymph node negative for metastatic carcinoma (0/1).    12/13/2020 Oncotype testing   Oncotype DX was obtained on the final surgical sample and the recurrence score of 8 predicts a risk of recurrence outside the breast over the next 9 years of 3%, if the patient's only systemic therapy is an antiestrogen for 5 years.  It also predicts a <1% benefit from chemotherapy.   01/2021 -  Anti-estrogen oral therapy   Tamoxifen daily     Interval History: Donna Robbins presents today for follow up.  She describes some improvement in right arm symptoms since starting the gabapentin.  Side effects, including drowsiness and "mental dulling" are limiting for her, especially with work.  There are days she can't take the medication.  Otherwise no new or progressive changes.  H+P (04/09/21) Patient presents today to review recent neurologic symptoms.  She describes  relatively sudden onset of right sided facial sensory change, starting 03/22/21.  She woke up feeling "funny on right side face, hard to describe, like constantly about to get goosebumps".  This symptom  has persisted since that time, now almost three weeks.  Several things can exacerbate the sensory impairment, including pressure applied to right arm/chest (putting on clothing or prosthetic), stress/anxiety, possibly some medications.  In addition, she experiences some bouts of vertigo when turning or bending forward/backwards; this may also worsen the sensory issue.  Finally, she has some nerve damage in her arm following right mastectomy on 11/27/20, this lead to periods of shooting pain, burning or numbness.  Medications: Current Outpatient Medications on File Prior to Visit  Medication Sig Dispense Refill   gabapentin (NEURONTIN) 300 MG capsule Take 1 capsule (300 mg total) by mouth 2 (two) times daily. 60 capsule 3   acetaminophen (TYLENOL) 500 MG tablet Take 1,000 mg by mouth 2 (two) times daily as needed (joint/muscle pain.).     anastrozole (ARIMIDEX) 1 MG tablet Take 1 tablet by mouth daily. 90 tablet 0   cholecalciferol (VITAMIN D) 1000 units tablet Take 1,000 Units by mouth in the morning.     clonazePAM (KLONOPIN) 0.5 MG tablet TAKE 1/2 TO 1 TABLET BY MOUTH DAILY AS NEEDED FOR ANXIETY (Patient taking differently: Take 0.25 mg by mouth daily as needed for anxiety.) 30 tablet 2   COVID-19 At Home Antigen Test (CARESTART COVID-19 HOME TEST) KIT Use as directed. 4 each 0   Multiple Vitamin (MULTIVITAMIN WITH MINERALS) TABS tablet Take 1 tablet by mouth in the morning. (Patient not taking: Reported on 03/22/2021)     nitroGLYCERIN (NITRODUR - DOSED IN MG/24 HR) 0.2 mg/hr patch APPLY 1/4 PATCH DAILY TO TENDON FOR TENDONITIS. (Patient not taking: Reported on 03/22/2021) 30 patch 1   venlafaxine XR (EFFEXOR-XR) 37.5 MG 24 hr capsule Take 1 capsule (37.5 mg total) by mouth daily with breakfast. 30 capsule 0   vitamin B-12 (CYANOCOBALAMIN) 1000 MCG tablet Take 1,000 mcg by mouth in the morning.     No current facility-administered medications on file prior to visit.    Allergies:  Allergies   Allergen Reactions   Iodine Itching    REACTION: Itching   Sulfonamide Derivatives Itching and Rash    REACTION: Itching Rash   Past Medical History:  Past Medical History:  Diagnosis Date   Allergy    Anxiety    Breast cancer ([place])    Depression    Hx of adenomatous polyp of colon 07/18/2016   Irritable bowel syndrome    OBESITY    Past Surgical History:  Past Surgical History:  Procedure Laterality Date   CESAREAN SECTION     x's 2   CHOLECYSTECTOMY  2009   ENT surgery  2004   MASTECTOMY W/ SENTINEL NODE BIOPSY Right 12/13/2020   Procedure: RIGHT MASTECTOMY WITH SENTINEL LYMPH NODE BIOPSY;  Surgeon: Coralie Keens, MD;  Location: [facility];  Service: General;  Laterality: Right;   Social History:  Social History   Socioeconomic History   Marital status: Married    Spouse name: Not on file   Number of children: 2   Years of education: Not on file   Highest education level: Not on file  Occupational History   Occupation: Community education officer    Employer: [employer]  Tobacco Use   Smoking status: Never   Smokeless tobacco: Never  Substance and Sexual Activity   Alcohol use: Yes  Alcohol/week: 2.0 standard drinks    Types: 2 Glasses of wine per week    Comment: Rarely   Drug use: Yes    Types: Marijuana    Comment: once a month   Sexual activity: Not on file  Other Topics Concern   Not on file  Social History Narrative   Not on file   Social Determinants of Health   Financial Resource Strain: Low Risk    Difficulty of Paying Living Expenses: Not hard at all  Food Insecurity: No Food Insecurity   Worried About Charity fundraiser in the Last Year: Never true   Ran Out of Food in the Last Year: Never true  Transportation Needs: No Transportation Needs   Lack of Transportation (Medical): No   Lack of Transportation (Non-Medical): No  Physical Activity: Sufficiently Active   Days of Exercise per Week: 3 days   Minutes of Exercise per  Session: 60 min  Stress: Stress Concern Present   Feeling of Stress : To some extent  Social Connections: Unknown   Frequency of Communication with Friends and Family: More than three times a week   Frequency of Social Gatherings with Friends and Family: Twice a week   Attends Religious Services: Never   Marine scientist or Organizations: Yes   Attends Music therapist: 1 to 4 times per year   Marital Status: Not on file  Intimate Partner Violence: Not At Risk   Fear of Current or Ex-Partner: No   Emotionally Abused: No   Physically Abused: No   Sexually Abused: No   Family History:  Family History  Problem Relation Age of Onset   Diabetes Mother    Hyperlipidemia Mother    Cancer Father        Angio sarcoma   Hyperlipidemia Father    Arthritis Maternal Grandmother    Lung cancer Maternal Grandmother    Lung cancer Maternal Grandfather    Lung cancer Paternal Grandmother    Arthritis Other    Colon cancer Neg Hx    Stomach cancer Neg Hx    Rectal cancer Neg Hx    Esophageal cancer Neg Hx    Liver cancer Neg Hx     Review of Systems: Constitutional: Doesn't report fevers, chills or abnormal weight loss Eyes: Doesn't report blurriness of vision Ears, nose, mouth, throat, and face: Doesn't report sore throat Respiratory: Doesn't report cough, dyspnea or wheezes Cardiovascular: Doesn't report palpitation, chest discomfort  Gastrointestinal:  Doesn't report nausea, constipation, diarrhea GU: Doesn't report incontinence Skin: Doesn't report skin rashes Neurological: Per HPI Musculoskeletal: Doesn't report joint pain Behavioral/Psych: Doesn't report anxiety  Physical Exam: Vitals:   04/30/21 1244  BP: 114/84  Pulse: 80  Resp: 16  Temp: 98.7 F (37.1 C)  SpO2: 100%    KPS: 90. General: Alert, cooperative, pleasant, in no acute distress Head: Normal EENT: No conjunctival injection or scleral icterus.  Lungs: Resp effort normal Cardiac:  Regular rate Abdomen: Non-distended abdomen Skin: No rashes cyanosis or petechiae. Extremities: No clubbing or edema  Neurologic Exam: Mental Status: Awake, alert, attentive to examiner. Oriented to self and environment. Language is fluent with intact comprehension.  Cranial Nerves: Visual acuity is grossly normal. Visual fields are full. Extra-ocular movements intact. No ptosis. Face is symmetric Motor: Tone and bulk are normal. Power is full in both arms and legs. Reflexes are symmetric, no pathologic reflexes present.  Sensory: Impaired right V1,2,3 Gait: Normal.   Labs: I have reviewed  the data as listed    Component Value Date/Time   NA 140 01/08/2021 1533   K 4.1 01/08/2021 1533   CL 104 01/08/2021 1533   CO2 26 01/08/2021 1533   GLUCOSE 86 01/08/2021 1533   BUN 7 01/08/2021 1533   CREATININE 0.67 01/08/2021 1533   CALCIUM 9.0 01/08/2021 1533   PROT 6.9 01/08/2021 1533   ALBUMIN 3.7 01/08/2021 1533   AST 17 01/08/2021 1533   ALT 15 01/08/2021 1533   ALKPHOS 52 01/08/2021 1533   BILITOT 0.3 01/08/2021 1533   GFRNONAA >60 01/08/2021 1533   GFRAA  05/23/2010 2050    >60        The eGFR has been calculated using the MDRD equation. This calculation has not been validated in all clinical situations. eGFR's persistently <60 mL/min signify possible Chronic Kidney Disease.   Lab Results  Component Value Date   WBC 8.5 01/08/2021   NEUTROABS 4.9 01/08/2021   HGB 12.3 01/08/2021   HCT 37.4 01/08/2021   MCV 97.7 01/08/2021   PLT 289 01/08/2021    Imaging:  MR BRAIN W WO CONTRAST  Result Date: 04/27/2021 CLINICAL DATA:  Breast cancer, metastatic disease evaluation, trigeminal neuropathy EXAM: MRI HEAD WITHOUT AND WITH CONTRAST TECHNIQUE: Multiplanar, multiecho pulse sequences of the brain and surrounding structures were obtained without and with intravenous contrast. CONTRAST:  17mL GADAVIST GADOBUTROL 1 MMOL/ML IV SOLN COMPARISON:  None. FINDINGS: Brain: There is no  acute infarction or intracranial hemorrhage. There is no intracranial mass, mass effect, or edema. There is no hydrocephalus or extra-axial fluid collection. Ventricles and sulci are normal in size and configuration. Few scattered small foci of T2 hyperintensity in the supratentorial white matter are nonspecific but could reflect minor chronic microvascular ischemic changes. No abnormal enhancement. Vascular: Major vessel flow voids at the skull base are preserved. Skull and upper cervical spine: Normal marrow signal is preserved. Sinuses/Orbits: Paranasal sinuses are aerated. Orbits are unremarkable. Other: Sella is partially empty.  Mastoid air cells are clear. IMPRESSION: No evidence of intracranial metastatic disease or other significant abnormality. Electronically Signed   By: Macy Mis M.D.   On: 04/27/2021 09:04     Assessment/Plan Trigeminal neuropathy Cervical radiculopathy  Donna Robbins is modestly clinically improved today.  We reviewed MRI brain which was normal.  Because gabapentin has been poorly tolerated, we offered a trial of Cymbalta $RemoveBef'30mg'[id number]$  daily.  This could also be helpful given anxiety syndrome.  She will consider this and get back to [place] if desired.  Could also consider short course of steroids with recurrent or refractory pain symptoms.   We appreciate the opportunity to participate in the care of Donna Robbins.  We will follow up with her as needed.  All questions were answered. The patient knows to call the clinic with any problems, questions or concerns. No barriers to learning were detected.  The total time spent in the encounter was 30 minutes and more than 50% was on counseling and review of test results   Ventura Sellers, MD Medical Director of Neuro-Oncology [facility] at [employer] 04/30/21 12:45 PM

## 2021-05-07 ENCOUNTER — Other Ambulatory Visit (HOSPITAL_COMMUNITY): Payer: Self-pay

## 2021-05-15 ENCOUNTER — Other Ambulatory Visit (HOSPITAL_COMMUNITY): Payer: Self-pay

## 2021-05-18 ENCOUNTER — Other Ambulatory Visit (HOSPITAL_COMMUNITY): Payer: Self-pay

## 2021-05-24 DIAGNOSIS — C50911 Malignant neoplasm of unspecified site of right female breast: Secondary | ICD-10-CM | POA: Diagnosis not present

## 2021-05-26 ENCOUNTER — Other Ambulatory Visit (HOSPITAL_COMMUNITY): Payer: Self-pay

## 2021-05-26 ENCOUNTER — Encounter: Payer: Self-pay | Admitting: Internal Medicine

## 2021-05-26 MED ORDER — CIPROFLOXACIN HCL 250 MG PO TABS
250.0000 mg | ORAL_TABLET | Freq: Two times a day (BID) | ORAL | 0 refills | Status: DC
  Filled 2021-05-26: qty 6, 3d supply, fill #0

## 2021-05-28 ENCOUNTER — Encounter: Payer: Self-pay | Admitting: Adult Health

## 2021-05-29 ENCOUNTER — Other Ambulatory Visit: Payer: Self-pay

## 2021-05-29 ENCOUNTER — Inpatient Hospital Stay: Payer: 59 | Attending: Adult Health

## 2021-05-29 VITALS — BP 137/95 | HR 67 | Temp 98.6°F | Resp 18

## 2021-05-29 DIAGNOSIS — Z79818 Long term (current) use of other agents affecting estrogen receptors and estrogen levels: Secondary | ICD-10-CM | POA: Insufficient documentation

## 2021-05-29 DIAGNOSIS — C50411 Malignant neoplasm of upper-outer quadrant of right female breast: Secondary | ICD-10-CM | POA: Diagnosis not present

## 2021-05-29 MED ORDER — GOSERELIN ACETATE 3.6 MG ~~LOC~~ IMPL
3.6000 mg | DRUG_IMPLANT | Freq: Once | SUBCUTANEOUS | Status: AC
  Administered 2021-05-29: 3.6 mg via SUBCUTANEOUS
  Filled 2021-05-29: qty 3.6

## 2021-06-04 ENCOUNTER — Other Ambulatory Visit (INDEPENDENT_AMBULATORY_CARE_PROVIDER_SITE_OTHER): Payer: 59

## 2021-06-04 ENCOUNTER — Ambulatory Visit: Payer: 59 | Admitting: Neurology

## 2021-06-04 ENCOUNTER — Encounter: Payer: Self-pay | Admitting: Neurology

## 2021-06-04 ENCOUNTER — Other Ambulatory Visit: Payer: Self-pay

## 2021-06-04 VITALS — BP 122/84 | HR 81 | Ht 63.0 in | Wt 181.0 lb

## 2021-06-04 DIAGNOSIS — R202 Paresthesia of skin: Secondary | ICD-10-CM | POA: Diagnosis not present

## 2021-06-04 LAB — B12 AND FOLATE PANEL
Folate: 23.5 ng/mL (ref >5.9)
Vitamin B-12: 303 pg/mL (ref 211–911)

## 2021-06-04 LAB — TSH: TSH: 1.49 u[IU]/mL (ref 0.35–5.50)

## 2021-06-04 NOTE — Patient Instructions (Addendum)
Check labs 

## 2021-06-04 NOTE — Progress Notes (Signed)
?[facility] ?Neurology Division ?Clinic Note - Initial Visit ? ? ?Date: 06/04/21 ? ?Donna Robbins ?MRN: [id number] ?DOB: 19-Jun-1970 ? ? ?Dear Dr. Sharlet Salina: ? ?Thank you for your kind referral of Donna Robbins for consultation of paresthesias. Although her history is well known to you, please allow [place] to reiterate it for the purpose of our medical record. The patient was accompanied to the clinic by self. ?  ? ?History of Present Illness: ?Donna Robbins is a 51 y.o. right-handed female with right breast cancer s/p mastectomy (2022), IBS, and depression/anxiety presenting for evaluation of facial paresthesias.  She underwent right mastectomy in September 2022.  Following this, she developed tingling over the right face and chest.  She does not have tingling involving the arm.  Symptoms are intermittent throughout the day, which can last for severe hours.  There are no specific triggers.  Sometimes she can scratch her face and her chest will start to tingle. Symptoms have improved since onset because it is less frequent.  She has found that stretching and massage tends to help alleviate.  She has cold sensation of the arm.  She saw her neurooncologist for these symptoms who ordered MRI brain wwo contrast which was normal.  She was started on gabapentin 341m BID, which she is only taking at bedtime and feels that it helps. ? ?She does not have similar symptoms on the left arm or either leg.   ? ?She works as pEngineer, building servicesfor vascular surgery.  ? ?Out-side paper records, electronic medical record, and images have been reviewed where available and summarized as:  ?MRI brain wwo contrast 04/27/2021: No evidence of intracranial metastatic disease or other significant abnormality. ? ?Lab Results  ?Component Value Date  ? HGBA1C 5.7 02/07/2020  ? ?Lab Results  ?Component Value Date  ? [id number] 08/01/2017  ? ? ?Past Medical History:  ?Diagnosis Date  ? Allergy   ? Anxiety   ? [place]   ? Depression   ? Hx of adenomatous polyp of colon 07/18/2016  ? Irritable bowel syndrome   ? OBESITY   ? ? ?Past Surgical History:  ?Procedure Laterality Date  ? CESAREAN SECTION    ? x's 2  ? CHOLECYSTECTOMY  2009  ? ENT surgery  2004  ? MASTECTOMY W/ SENTINEL NODE BIOPSY Right 12/13/2020  ? Procedure: RIGHT MASTECTOMY WITH SENTINEL LYMPH NODE BIOPSY;  Surgeon: BCoralie Keens MD;  [facility] [facility]  Service: General;  Laterality: Right;  ? ? ? ?Medications:  ?Outpatient Encounter Medications as of 06/04/2021  ?Medication Sig Note  ? acetaminophen (TYLENOL) 500 MG tablet Take 1,000 mg by mouth 2 (two) times daily as needed (joint/muscle pain.).   ? anastrozole (ARIMIDEX) 1 MG tablet Take 1 tablet by mouth daily.   ? cholecalciferol (VITAMIN D) 1000 units tablet Take 1,000 Units by mouth in the morning.   ? clonazePAM (KLONOPIN) 0.5 MG tablet TAKE 1/2 TO 1 TABLET BY MOUTH DAILY AS NEEDED FOR ANXIETY (Patient taking differently: Take 0.25 mg by mouth daily as needed for anxiety.)   ? COVID-19 At Home Antigen Test ([facility]) KIT Use as directed.   ? gabapentin (NEURONTIN) 300 MG capsule Take 1 capsule (300 mg total) by mouth 2 (two) times daily. (Patient taking differently: Take 300 mg by mouth at bedtime. One capsule at night)   ? goserelin (ZOLADEX) 3.6 MG injection Inject 3.6 mg into the skin every 28 (twenty-eight) days.   ? Multiple  Vitamin (MULTIVITAMIN WITH MINERALS) TABS tablet Take 1 tablet by mouth in the morning.   ? nitroGLYCERIN (NITRODUR - DOSED IN MG/24 HR) 0.2 mg/hr patch APPLY 1/4 PATCH DAILY TO TENDON FOR TENDONITIS. 11/30/2020: On hand--patient has never used  ? vitamin B-12 (CYANOCOBALAMIN) 1000 MCG tablet Take 1,000 mcg by mouth in the morning.   ? [DISCONTINUED] ciprofloxacin (CIPRO) 250 MG tablet Take 1 tablet (250 mg total) by mouth twice a day for 3 days. (Patient not taking: Reported on 06/04/2021)   ? ?No facility-administered encounter medications on file as  of 06/04/2021.  ? ? ?Allergies:  ?Allergies  ?Allergen Reactions  ? Iodine Itching  ?  REACTION: Itching  ? Sulfonamide Derivatives Itching and Rash  ?  REACTION: Itching ?Rash  ? ? ?Family History: ?Family History  ?Problem Relation Age of Onset  ? Cancer Mother   ?     Melanoma in the eye  ? Diabetes Mother   ? Hyperlipidemia Mother   ? Cancer Father   ?     Angio sarcoma  ? Hyperlipidemia Father   ? Arthritis Maternal Grandmother   ? Lung cancer Maternal Grandmother   ? Lung cancer Maternal Grandfather   ? Lung cancer Paternal Grandmother   ? Arthritis Other   ? Colon cancer Neg Hx   ? Stomach cancer Neg Hx   ? Rectal cancer Neg Hx   ? Esophageal cancer Neg Hx   ? Liver cancer Neg Hx   ? ? ?Social History: ?Social History  ? ?Tobacco Use  ? Smoking status: Never  ? Smokeless tobacco: Never  ?Substance Use Topics  ? Alcohol use: Yes  ?  Alcohol/week: 2.0 standard drinks  ?  Types: 2 Glasses of wine per week  ?  Comment: Rarely - glass of wine occasionally  ? Drug use: Yes  ?  Types: Marijuana  ?  Comment: once a month/ Occasional Edible  ? ?Social History  ? ?Social History Narrative  ? Right Handed   ? Lives in a one story home   ? ? ?Vital Signs:  ?BP 122/84   Pulse 81   Ht 5' 3" (1.6 m)   Wt 181 lb (82.1 kg)   SpO2 97%   BMI 32.06 kg/m?  ? ? ?Neurological Exam: ?MENTAL STATUS including orientation to time, place, person, recent and remote memory, attention span and concentration, language, and fund of knowledge is normal.  Speech is not dysarthric. ? ?CRANIAL NERVES: ?II:  No visual field defects.    ?III-IV-VI: Pupils equal round and reactive to light.  Normal conjugate, extra-ocular eye movements in all directions of gaze.  No nystagmus.  No ptosis.   ?V:  Normal facial sensation.    ?VII:  Normal facial symmetry and movements.   ?VIII:  Normal hearing and vestibular function.   ?IX-X:  Normal palatal movement.   ?XI:  Normal shoulder shrug and head rotation.   ?XII:  Normal tongue strength and range of  motion, no deviation or fasciculation. ? ?MOTOR:  No atrophy, fasciculations or abnormal movements.  No pronator drift.  ? ?Upper Extremity:  Right  Left  ?Deltoid  5/5   5/5   ?Biceps  5/5   5/5   ?Triceps  5/5   5/5   ?Infraspinatus 5/5  5/5  ?Medial pectoralis 5/5  5/5  ?Wrist extensors  5/5   5/5   ?Wrist flexors  5/5   5/5   ?Finger extensors  5/5   5/5   ?  Finger flexors  5/5   5/5   ?Dorsal interossei  5/5   5/5   ?Abductor pollicis  5/5   5/5   ?Tone (Ashworth scale)  0  0  ? ?Lower Extremity:  Right  Left  ?Hip flexors  5/5   5/5   ?Hip extensors  5/5   5/5   ?Adductor 5/5  5/5  ?Abductor 5/5  5/5  ?Knee flexors  5/5   5/5   ?Knee extensors  5/5   5/5   ?Dorsiflexors  5/5   5/5   ?Plantarflexors  5/5   5/5   ?Toe extensors  5/5   5/5   ?Toe flexors  5/5   5/5   ?Tone (Ashworth scale)  0  0  ? ?MSRs:  ?Right        Left                  ?brachioradialis 2+  2+  ?biceps 2+  2+  ?triceps 2+  2+  ?patellar 2+  2+  ?ankle jerk 2+  2+  ?Hoffman no  no  ?plantar response down  down  ? ?SENSORY:  Normal and symmetric perception of light touch, pinprick, vibration, and proprioception.  Romberg's sign absent.  ? ?COORDINATION/GAIT: Normal finger-to- nose-finger.  Intact rapid alternating movements bilaterally.   Gait narrow based and stable. Tandem and stressed gait intact.  ? ? ?IMPRESSION: ?Right facial and chest paresthesias, intermittent and migratory.  No evidence of structural disease on imaging. Exam is normal. Symptoms do not fit a cutaneous nerve or dermatomal distribution.  Fortunately, these are becoming less intense. ? - Check vitamin B12, folate, and TSH ? - Continue gabapentin 342m BID for symptom management ? - No role for EMG as she denies paresthesias involving the arm ? ?Return to clinic as needed ? ? ?Thank you for allowing me to participate in patient's care.  If I can answer any additional questions, I would be pleased to do so.   ? ?Sincerely, ? ? ? ?Donika K. PPosey Pronto DO ? ?

## 2021-06-06 ENCOUNTER — Other Ambulatory Visit (HOSPITAL_COMMUNITY): Payer: Self-pay

## 2021-06-18 ENCOUNTER — Other Ambulatory Visit (HOSPITAL_COMMUNITY): Payer: Self-pay

## 2021-06-18 ENCOUNTER — Ambulatory Visit: Payer: 59 | Attending: Surgery

## 2021-06-18 ENCOUNTER — Other Ambulatory Visit: Payer: Self-pay

## 2021-06-18 DIAGNOSIS — Z483 Aftercare following surgery for neoplasm: Secondary | ICD-10-CM | POA: Insufficient documentation

## 2021-06-18 NOTE — Therapy (Signed)
?  OUTPATIENT PHYSICAL THERAPY SOZO SCREENING NOTE ? ? ?Patient Name: Donna Robbins ?MRN: [id number] ?DOB:[birth year], 51 y.o., female ?Today's Date: 06/18/2021 ? ?PCP: Hoyt Koch, MD ?REFERRING PROVIDER: Coralie Keens, MD ? ? PT End of Session - 06/18/21 1027   ? ? Visit Number 14   # unchnaged due to screen only  ? PT Start Time 0815   ? PT Stop Time [phone]   ? PT Time Calculation (min) 7 min   ? Activity Tolerance Patient tolerated treatment well   ? Behavior During Therapy [facility] for tasks assessed/performed   ? ?  ?  ? ?  ? ? ?Past Medical History:  ?Diagnosis Date  ? Allergy   ? Anxiety   ? Breast cancer ([place])   ? Depression   ? Hx of adenomatous polyp of colon 07/18/2016  ? Irritable bowel syndrome   ? OBESITY   ? ?Past Surgical History:  ?Procedure Laterality Date  ? CESAREAN SECTION    ? x's 2  ? CHOLECYSTECTOMY  2009  ? ENT surgery  2004  ? MASTECTOMY W/ SENTINEL NODE BIOPSY Right 12/13/2020  ? Procedure: RIGHT MASTECTOMY WITH SENTINEL LYMPH NODE BIOPSY;  Surgeon: Coralie Keens, MD;  Location: [place];  Service: General;  Laterality: Right;  ? ?Patient Active Problem List  ? Diagnosis Date Noted  ? Post-mastectomy pain syndrome 04/25/2021  ? Cervico-occipital neuralgia of right side 04/25/2021  ? Trigger point with neuralgia 04/25/2021  ? Trigeminal neuropathy 04/09/2021  ? S/P mastectomy, right 12/13/2020  ? Malignant neoplasm of upper-outer quadrant of right breast in female, estrogen receptor positive ([place]) 11/13/2020  ? Chronic pain of left knee 04/15/2017  ? Hx of adenomatous polyp of colon 07/18/2016  ? Ventral hernia 11/15/2015  ? Routine general medical examination at a health care facility 11/24/2014  ? Anxiety   ? HEARING LOSS 03/14/2010  ? Obesity 03/06/2010  ? IRRITABLE BOWEL SYNDROME 03/06/2010  ? ? ?REFERRING DIAG: right breast cancer at risk for lymphedema ? ?THERAPY DIAG:  ?Aftercare following surgery for neoplasm ? ?PERTINENT HISTORY: Patient was diagnosed on 10/24/2020 with  right grade I invasive ductal carcinoma breast cancer. She underwent a right mastectomy and sentinel node bipsy (5 negative nodes) on 9/21/2022It is ER/PR positive and HER2 negative with a Ki67 of 1%.  ? ?PRECAUTIONS: right UE Lymphedema risk, None ? ?SUBJECTIVE: Pt returns for her 3 month L-Dex screen. ? ?PAIN:  ?Are you having pain? No ? ?SOZO SCREENING: ?Patient was assessed today using the SOZO machine to determine the lymphedema index score. This was compared to her baseline score. It was determined that she is within the recommended range when compared to her baseline and no further action is needed at this time. She will continue SOZO screenings. These are done every 3 months for 2 years post operatively followed by every 6 months for 2 years, and then annually. ? ?Plan: Cont every 3 month L-Dex screens for up to 2 years from her SLNB (~12/14/2022) ? ? ? ?Otelia Limes, PTA ?06/18/2021, 8:23 AM ? ?   ?

## 2021-06-26 ENCOUNTER — Inpatient Hospital Stay: Payer: 59

## 2021-06-26 ENCOUNTER — Other Ambulatory Visit: Payer: Self-pay

## 2021-06-26 ENCOUNTER — Inpatient Hospital Stay: Payer: 59 | Attending: Adult Health

## 2021-06-26 VITALS — BP 144/97 | HR 71 | Temp 98.0°F | Resp 17

## 2021-06-26 DIAGNOSIS — C50411 Malignant neoplasm of upper-outer quadrant of right female breast: Secondary | ICD-10-CM | POA: Diagnosis not present

## 2021-06-26 DIAGNOSIS — Z170 Estrogen receptor positive status [ER+]: Secondary | ICD-10-CM

## 2021-06-26 DIAGNOSIS — Z79818 Long term (current) use of other agents affecting estrogen receptors and estrogen levels: Secondary | ICD-10-CM | POA: Insufficient documentation

## 2021-06-26 MED ORDER — GOSERELIN ACETATE 3.6 MG ~~LOC~~ IMPL
3.6000 mg | DRUG_IMPLANT | Freq: Once | SUBCUTANEOUS | Status: AC
  Administered 2021-06-26: 3.6 mg via SUBCUTANEOUS
  Filled 2021-06-26: qty 3.6

## 2021-07-11 DIAGNOSIS — L820 Inflamed seborrheic keratosis: Secondary | ICD-10-CM | POA: Diagnosis not present

## 2021-07-11 DIAGNOSIS — L110 Acquired keratosis follicularis: Secondary | ICD-10-CM | POA: Diagnosis not present

## 2021-07-24 ENCOUNTER — Inpatient Hospital Stay: Payer: 59

## 2021-07-24 DIAGNOSIS — C50911 Malignant neoplasm of unspecified site of right female breast: Secondary | ICD-10-CM | POA: Diagnosis not present

## 2021-07-25 ENCOUNTER — Other Ambulatory Visit: Payer: Self-pay

## 2021-07-25 ENCOUNTER — Inpatient Hospital Stay: Payer: 59 | Attending: Oncology

## 2021-07-25 ENCOUNTER — Other Ambulatory Visit: Payer: Self-pay | Admitting: Adult Health

## 2021-07-25 ENCOUNTER — Other Ambulatory Visit (HOSPITAL_COMMUNITY): Payer: Self-pay

## 2021-07-25 VITALS — BP 150/82 | HR 60 | Temp 98.2°F | Resp 17

## 2021-07-25 DIAGNOSIS — Z79810 Long term (current) use of selective estrogen receptor modulators (SERMs): Secondary | ICD-10-CM | POA: Diagnosis not present

## 2021-07-25 DIAGNOSIS — Z9011 Acquired absence of right breast and nipple: Secondary | ICD-10-CM | POA: Insufficient documentation

## 2021-07-25 DIAGNOSIS — Z79818 Long term (current) use of other agents affecting estrogen receptors and estrogen levels: Secondary | ICD-10-CM | POA: Insufficient documentation

## 2021-07-25 DIAGNOSIS — Z170 Estrogen receptor positive status [ER+]: Secondary | ICD-10-CM | POA: Insufficient documentation

## 2021-07-25 DIAGNOSIS — C50411 Malignant neoplasm of upper-outer quadrant of right female breast: Secondary | ICD-10-CM | POA: Diagnosis not present

## 2021-07-25 MED ORDER — GOSERELIN ACETATE 3.6 MG ~~LOC~~ IMPL
3.6000 mg | DRUG_IMPLANT | Freq: Once | SUBCUTANEOUS | Status: AC
  Administered 2021-07-25: 3.6 mg via SUBCUTANEOUS
  Filled 2021-07-25: qty 3.6

## 2021-07-25 MED ORDER — ANASTROZOLE 1 MG PO TABS
1.0000 mg | ORAL_TABLET | Freq: Every day | ORAL | 3 refills | Status: DC
  Filled 2021-07-25: qty 90, 90d supply, fill #0

## 2021-07-25 MED ORDER — CLINDAMYCIN PHOSPHATE 1 % EX SOLN
CUTANEOUS | 2 refills | Status: DC
  Filled 2021-07-25: qty 60, 30d supply, fill #0
  Filled 2022-02-09: qty 60, 30d supply, fill #1
  Filled 2022-04-11 – 2022-04-16 (×2): qty 60, 30d supply, fill #2

## 2021-08-06 ENCOUNTER — Other Ambulatory Visit (HOSPITAL_COMMUNITY): Payer: Self-pay

## 2021-08-13 ENCOUNTER — Encounter: Payer: Self-pay | Admitting: Internal Medicine

## 2021-08-13 NOTE — Progress Notes (Signed)
Patient Care Team: Hoyt Koch, MD as PCP - General (Internal Medicine) Servando Salina, MD as Consulting Physician (Obstetrics and Gynecology) Thurman Coyer, DO as Consulting Physician (Family Medicine) Gatha Mayer, MD (Gastroenterology) Coralie Keens, MD as Consulting Physician (General Surgery) Gery Pray, MD as Consulting Physician (Radiation Oncology) Gregor Hams, MD as Consulting Physician (Family Medicine) Nicholas Lose, MD as Consulting Physician (Hematology and Oncology) Pickenpack-Cousar, Carlena Sax, NP as Nurse Practitioner (Hospice and Palliative Medicine) Alda Berthold, DO as Consulting Physician (Neurology)  DIAGNOSIS:  Encounter Diagnosis  Name Primary?   Malignant neoplasm of upper-outer quadrant of right breast in female, estrogen receptor positive ([place])     SUMMARY OF ONCOLOGIC HISTORY: Oncology History  Malignant neoplasm of upper-outer quadrant of right breast in female, estrogen receptor positive ([place])  11/06/2020 Mammogram   Exam: 3D Mammogram Diagnostic - Right Breast Ultrasound - Right  IMPRESSION: The 0.8 cm irregular mass in the right breast at 9 o'clock is highly suggestive of malignancy. Ultrasound of the right axilla is negative for lymphadenopathy.   11/08/2020 Initial Biopsy   Diagnosis Breast, right, needle core biopsy, 9 o'clock 10cm fn posterior depth - INVASIVE DUCTAL CARCINOMA, GRADE 1/2. - DUCTAL CARCINOMA IN SITU. - SEE MICROSCOPIC DESCRIPTION.  Microscopic Comment The greatest tumor dimension is 0.5 cm.  PROGNOSTIC INDICATORS Results: The tumor cells are NEGATIVE for Her2 (0). Estrogen Receptor: 100%, POSITIVE, STRONG STAINING INTENSITY Progesterone Receptor: 100%, POSITIVE, STRONG STAINING INTENSITY Proliferation Marker Ki67: 1%   11/13/2020 Initial Diagnosis   Malignant neoplasm of upper-outer quadrant of right breast in female, estrogen receptor positive ([place])    11/15/2020 Cancer Staging    Staging form: Breast, AJCC 8th Edition - Clinical stage from 11/15/2020: Stage IA (cT1b, cN0, cM0, G1, ER+, PR+, HER2-) - Signed by Chauncey Cruel, MD on 11/15/2020 Stage prefix: Initial diagnosis Histologic grading system: 3 grade system    12/13/2020 Cancer Staging   Staging form: Breast, AJCC 8th Edition - Pathologic stage from 12/13/2020: Stage IA (pT1b, pN0, cM0, G1, ER+, PR+, HER2-) - Signed by Gardenia Phlegm, NP on 03/20/2021 Stage prefix: Initial diagnosis Histologic grading system: 3 grade system    12/13/2020 Definitive Surgery   FINAL MICROSCOPIC DIAGNOSIS:   A. BREAST, RIGHT, MASTECTOMY:  - Invasive and in situ ductal carcinoma, 0.9 cm.  - Margins not involved.  - Biopsy site and biopsy clip.  - See oncology table.   B. LYMPH NODE, RIGHT AXILLARY #1, SENTINEL, EXCISION:  - One lymph node negative for metastatic carcinoma (0/1).   C. LYMPH NODE, RIGHT AXILLARY #2, SENTINEL, EXCISION:  - One lymph node negative for metastatic carcinoma (0/1).   D. LYMPH NODE, RIGHT AXILLARY, SENTINEL, EXCISION:  - One lymph node negative for metastatic carcinoma (0/1).   E. LYMPH NODE, RIGHT AXILLARY, SENTINEL, EXCISION:  - One lymph node negative for metastatic carcinoma (0/1).   F. LYMPH NODE, RIGHT AXILLARY, SENTINEL, EXCISION:  - One lymph node negative for metastatic carcinoma (0/1).    12/13/2020 Oncotype testing   Oncotype DX was obtained on the final surgical sample and the recurrence score of 8 predicts a risk of recurrence outside the breast over the next 9 years of 3%, if the patient's only systemic therapy is an antiestrogen for 5 years.  It also predicts a <1% benefit from chemotherapy.   01/2021 -  Anti-estrogen oral therapy   Tamoxifen daily     CHIEF COMPLIANT:  Estrogen receptor positive breast cancer (s/p right mastectomy) was  on Zoladex and anastrozole, to establish oncology care  INTERVAL HISTORY: Donna Robbins is a 51 y.o. with the above  mention  Estrogen receptor positive breast cancer (s/p right mastectomy). She presents to the clinic today for a follow-up. States that she had swelling in hands and feet. Complains of manageable hot flashes. States that the swelling is better. Denies pain and discomfort. She could not tolerate tamoxifen previously because of swelling of her extremities.  She was able to better tolerate Zoladex with anastrozole but the cost of care was getting quite prohibitive and therefore she would like to reconsider tamoxifen plan.   ALLERGIES:  is allergic to iodine and sulfonamide derivatives.  MEDICATIONS:  Current Outpatient Medications  Medication Sig Dispense Refill   tamoxifen (NOLVADEX) 10 MG tablet Take 1 tablet by mouth daily. 90 tablet 3   acetaminophen (TYLENOL) 500 MG tablet Take 1,000 mg by mouth 2 (two) times daily as needed (joint/muscle pain.).     clindamycin (CLEOCIN T) 1 % external solution Apply 1 mL on the skin twice a day 60 mL 2   clonazePAM (KLONOPIN) 0.5 MG tablet TAKE 1/2 TO 1 TABLET BY MOUTH DAILY AS NEEDED FOR ANXIETY (Patient taking differently: Take 0.25 mg by mouth daily as needed for anxiety.) 30 tablet 2   COVID-19 At Home Antigen Test (CARESTART COVID-19 HOME TEST) KIT Use as directed. 4 each 0   gabapentin (NEURONTIN) 300 MG capsule Take 1 capsule (300 mg total) by mouth 2 (two) times daily. (Patient taking differently: Take 300 mg by mouth at bedtime. One capsule at night) 60 capsule 3   Multiple Vitamin (MULTIVITAMIN WITH MINERALS) TABS tablet Take 1 tablet by mouth in the morning.     nitroGLYCERIN (NITRODUR - DOSED IN MG/24 HR) 0.2 mg/hr patch APPLY 1/4 PATCH DAILY TO TENDON FOR TENDONITIS. 30 patch 1   traZODone (DESYREL) 50 MG tablet Take by mouth.     vitamin B-12 (CYANOCOBALAMIN) 1000 MCG tablet Take 1,000 mcg by mouth in the morning.     No current facility-administered medications for this visit.    PHYSICAL EXAMINATION: ECOG PERFORMANCE STATUS: 1 -  Symptomatic but completely ambulatory  Vitals:   08/21/21 0817  BP: (!) 147/83  Pulse: 77  Resp: 18  Temp: (!) 97.5 F (36.4 C)  SpO2: 100%   Filed Weights   08/21/21 0817  Weight: 189 lb 4.8 oz (85.9 kg)     LABORATORY DATA:  I have reviewed the data as listed    Latest Ref Rng & Units 01/08/2021    3:33 PM 11/15/2020   12:28 PM 02/07/2020    9:16 AM  CMP  Glucose 70 - 99 mg/dL 86   78   85    BUN 6 - 20 mg/dL $Remove'7   10   9    'JCatzEB$ Creatinine 0.44 - 1.00 mg/dL 0.67   0.76   0.61    Sodium 135 - 145 mmol/L 140   140   140    Potassium 3.5 - 5.1 mmol/L 4.1   4.1   4.0    Chloride 98 - 111 mmol/L 104   103   104    CO2 22 - 32 mmol/L $RemoveB'26   28   30    'DCWJwVnO$ Calcium 8.9 - 10.3 mg/dL 9.0   9.7   9.2    Total Protein 6.5 - 8.1 g/dL 6.9   8.0   7.5    Total Bilirubin 0.3 - 1.2 mg/dL 0.3  0.4   0.4    Alkaline Phos 38 - 126 U/L 52   62   49    AST 15 - 41 U/L $Remo'17   22   18    'GfWpa$ ALT 0 - 44 U/L $Remo'15   21   16      'Jyopv$ Lab Results  Component Value Date   WBC 8.5 01/08/2021   HGB 12.3 01/08/2021   HCT 37.4 01/08/2021   MCV 97.7 01/08/2021   PLT 289 01/08/2021   NEUTROABS 4.9 01/08/2021    ASSESSMENT & PLAN:  Malignant neoplasm of upper-outer quadrant of right breast in female, estrogen receptor positive ([place]) 11/08/2020: Clinical T1BN0 stage Ia IDC grade 2, ER/PR positive HER2 negative Ki-67 1% 12/13/2021: Right mastectomy: Grade 1 IDC margins -0/5 lymph nodes negative, Oncotype score 8: 3% ROR Current treatment: Tamoxifen started 01/23/2021 discontinued and switched to Zoladex with anastrozole (due to swelling of extremities) Anastrozole/Zoladex toxicities: Tolerating it better with mild to moderate hot flashes and mild to moderate joint stiffness Because of expense related to Zoladex she would like to switch back to tamoxifen.  Tamoxifen counseling: I discussed with her about starting at a low dose of tamoxifen of 5 mg daily for a couple of weeks and then increasing to 10 mg if she can tolerate  it.  I do not believe she can tolerate the 20 mg dosage as she might be a slow metabolizer of tamoxifen.  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.  Breast cancer surveillance: 1.  Breast exam 08/21/2021: Benign 2. mammograms to be done in August 2023 at [facility]  Menopause status: If she does not get any further cycles we might check [place] and estradiol levels in 6 months and reassess if she is truly in menopause.  At that time she could be switched to anastrozole.  Telephone visit in 6 weeks to assess tolerance to tamoxifen at low-dose      Orders Placed This Encounter  Procedures   MM DIAG BREAST TOMO UNI LEFT    Standing Status:   Future    Standing Expiration Date:   08/22/2022    Order Specific Question:   Reason for Exam (SYMPTOM  OR DIAGNOSIS REQUIRED)    Answer:   ANnual mammograms with H/O right mastectomy    Order Specific Question:   Is the patient pregnant?    Answer:   No    Order Specific Question:   Preferred imaging location?    Answer:   External    Comments:   [facility]    Order Specific Question:   Release to patient    Answer:   Immediate   The patient has a good understanding of the overall plan. she agrees with it. she will call with any problems that may develop before the next visit here. Total time spent: 30 mins including face to face time and time spent for planning, charting and co-ordination of care   Harriette Ohara, MD 08/21/21    I Gardiner Coins am scribing for Dr. Lindi Adie  I have reviewed the above documentation for accuracy and completeness, and I agree with the above.

## 2021-08-21 ENCOUNTER — Inpatient Hospital Stay: Payer: 59

## 2021-08-21 ENCOUNTER — Inpatient Hospital Stay: Payer: 59 | Admitting: Hematology and Oncology

## 2021-08-21 ENCOUNTER — Other Ambulatory Visit: Payer: Self-pay

## 2021-08-21 ENCOUNTER — Other Ambulatory Visit (HOSPITAL_COMMUNITY): Payer: Self-pay

## 2021-08-21 DIAGNOSIS — Z9011 Acquired absence of right breast and nipple: Secondary | ICD-10-CM | POA: Diagnosis not present

## 2021-08-21 DIAGNOSIS — Z79810 Long term (current) use of selective estrogen receptor modulators (SERMs): Secondary | ICD-10-CM | POA: Diagnosis not present

## 2021-08-21 DIAGNOSIS — Z79818 Long term (current) use of other agents affecting estrogen receptors and estrogen levels: Secondary | ICD-10-CM | POA: Diagnosis not present

## 2021-08-21 DIAGNOSIS — Z170 Estrogen receptor positive status [ER+]: Secondary | ICD-10-CM

## 2021-08-21 DIAGNOSIS — C50411 Malignant neoplasm of upper-outer quadrant of right female breast: Secondary | ICD-10-CM

## 2021-08-21 MED ORDER — TAMOXIFEN CITRATE 10 MG PO TABS
10.0000 mg | ORAL_TABLET | Freq: Every day | ORAL | 3 refills | Status: DC
  Filled 2021-08-21: qty 90, 90d supply, fill #0
  Filled 2021-11-22: qty 90, 90d supply, fill #1
  Filled 2022-02-28: qty 90, 90d supply, fill #2
  Filled 2022-06-03: qty 90, 90d supply, fill #3

## 2021-08-21 NOTE — Assessment & Plan Note (Addendum)
11/08/2020: Clinical T1BN0 stage Ia IDC grade 2, ER/PR positive HER2 negative Ki-67 1% 12/13/2021: Right mastectomy: Grade 1 IDC margins -0/5 lymph nodes negative, Oncotype score 8: 3% ROR Current treatment: Tamoxifen started 01/23/2021 discontinued and switched to Zoladex with anastrozole (due to swelling of extremities) Anastrozole/Zoladex toxicities: Tolerating it better with mild to moderate hot flashes and mild to moderate joint stiffness Because of expense related to Zoladex she would like to switch back to tamoxifen.  Tamoxifen counseling: I discussed with her about starting at a low dose of tamoxifen of 5 mg daily for a couple of weeks and then increasing to 10 mg if she can tolerate it.  I do not believe she can tolerate the 20 mg dosage as she might be a slow metabolizer of tamoxifen.  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.  Breast cancer surveillance: 1.  Breast exam 08/21/2021: Benign 2. mammograms to be done in August 2023 at [facility] visit in 6 weeks to assess tolerance to tamoxifen at low-dose

## 2021-08-22 ENCOUNTER — Telehealth: Payer: Self-pay | Admitting: Hematology and Oncology

## 2021-08-22 NOTE — Telephone Encounter (Signed)
Scheduled appointment per 5/30 los. Patient is aware.

## 2021-09-18 ENCOUNTER — Ambulatory Visit: Payer: 59

## 2021-09-20 ENCOUNTER — Ambulatory Visit: Payer: 59 | Admitting: Hematology and Oncology

## 2021-09-20 NOTE — Progress Notes (Signed)
HEMATOLOGY-ONCOLOGY TELEPHONE VISIT PROGRESS NOTE  I connected with Donna Robbins on 10/04/21 at  9:15 AM EDT by telephone and verified that I am speaking with the correct person using two identifiers.  I discussed the limitations, risks, security and privacy concerns of performing an evaluation and management service by telephone and the availability of in person appointments.  I also discussed with the patient that there may be a patient responsible charge related to this service. The patient expressed understanding and agreed to proceed.   History of Present Illness: Donna Robbins is a 51 y.o. with the above mention  Estrogen receptor positive breast cancer (s/p right mastectomy). She presents to the clinic today for via telephone follow-up. Tolerating 10 mg Tamoxifen dose well.   Oncology History  Malignant neoplasm of upper-outer quadrant of right breast in female, estrogen receptor positive ([place])  11/06/2020 Mammogram   Exam: 3D Mammogram Diagnostic - Right Breast Ultrasound - Right  IMPRESSION: The 0.8 cm irregular mass in the right breast at 9 o'clock is highly suggestive of malignancy. Ultrasound of the right axilla is negative for lymphadenopathy.   11/08/2020 Initial Biopsy   Diagnosis Breast, right, needle core biopsy, 9 o'clock 10cm fn posterior depth - INVASIVE DUCTAL CARCINOMA, GRADE 1/2. - DUCTAL CARCINOMA IN SITU. - SEE MICROSCOPIC DESCRIPTION.  Microscopic Comment The greatest tumor dimension is 0.5 cm.  PROGNOSTIC INDICATORS Results: The tumor cells are NEGATIVE for Her2 (0). Estrogen Receptor: 100%, POSITIVE, STRONG STAINING INTENSITY Progesterone Receptor: 100%, POSITIVE, STRONG STAINING INTENSITY Proliferation Marker Ki67: 1%   11/13/2020 Initial Diagnosis   Malignant neoplasm of upper-outer quadrant of right breast in female, estrogen receptor positive ([place])   11/15/2020 Cancer Staging   Staging form: Breast, AJCC 8th Edition - Clinical stage from  11/15/2020: Stage IA (cT1b, cN0, cM0, G1, ER+, PR+, HER2-) - Signed by Chauncey Cruel, MD on 11/15/2020 Stage prefix: Initial diagnosis Histologic grading system: 3 grade system   12/13/2020 Cancer Staging   Staging form: Breast, AJCC 8th Edition - Pathologic stage from 12/13/2020: Stage IA (pT1b, pN0, cM0, G1, ER+, PR+, HER2-) - Signed by Gardenia Phlegm, NP on 03/20/2021 Stage prefix: Initial diagnosis Histologic grading system: 3 grade system   12/13/2020 Definitive Surgery   FINAL MICROSCOPIC DIAGNOSIS:   A. BREAST, RIGHT, MASTECTOMY:  - Invasive and in situ ductal carcinoma, 0.9 cm.  - Margins not involved.  - Biopsy site and biopsy clip.  - See oncology table.   B. LYMPH NODE, RIGHT AXILLARY #1, SENTINEL, EXCISION:  - One lymph node negative for metastatic carcinoma (0/1).   C. LYMPH NODE, RIGHT AXILLARY #2, SENTINEL, EXCISION:  - One lymph node negative for metastatic carcinoma (0/1).   D. LYMPH NODE, RIGHT AXILLARY, SENTINEL, EXCISION:  - One lymph node negative for metastatic carcinoma (0/1).   E. LYMPH NODE, RIGHT AXILLARY, SENTINEL, EXCISION:  - One lymph node negative for metastatic carcinoma (0/1).   F. LYMPH NODE, RIGHT AXILLARY, SENTINEL, EXCISION:  - One lymph node negative for metastatic carcinoma (0/1).    12/13/2020 Oncotype testing   Oncotype DX was obtained on the final surgical sample and the recurrence score of 8 predicts a risk of recurrence outside the breast over the next 9 years of 3%, if the patient's only systemic therapy is an antiestrogen for 5 years.  It also predicts a <1% benefit from chemotherapy.   01/2021 -  Anti-estrogen oral therapy   Tamoxifen daily     REVIEW OF SYSTEMS:   Constitutional: Denies fevers,  chills or abnormal weight loss All other systems were reviewed with the patient and are negative. Observations/Objective:     Assessment Plan:  Malignant neoplasm of upper-outer quadrant of right breast in female,  estrogen receptor positive ([place]) 11/08/2020: Clinical T1BN0 stage Ia IDC grade 2, ER/PR positive HER2 negative Ki-67 1% 12/13/2021: Right mastectomy: Grade 1 IDC margins -0/5 lymph nodes negative, Oncotype score 8: 3% ROR Current treatment: Tamoxifen started 01/23/2021 discontinued and switched to Zoladex with anastrozole (due to swelling of extremities) Anastrozole/Zoladex toxicities: Tolerating it better with mild to moderate hot flashes and mild to moderate joint stiffness Because of expense related to Zoladex, we switched back to tamoxifen.   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.   Breast cancer surveillance: 1.  Breast exam 08/21/2021: Benign 2. mammograms to be done in August 2023 at [facility]   Menopause status:  If she does not get any further cycles we might check [place] and estradiol levels in 6 months and reassess if she is truly in menopause.  At that time she could be switched to anastrozole.    I discussed the assessment and treatment plan with the patient. The patient was provided an opportunity to ask questions and all were answered. The patient agreed with the plan and demonstrated an understanding of the instructions. The patient was advised to call back or seek an in-person evaluation if the symptoms worsen or if the condition fails to improve as anticipated.   I provided 12 minutes of non-face-to-face time during this encounter. Harriette Ohara, MD   I Gardiner Coins am scribing for Dr. Lindi Adie  I have reviewed the above documentation for accuracy and completeness, and I agree with the above. Marland Kitchen

## 2021-09-24 ENCOUNTER — Ambulatory Visit: Payer: 59 | Attending: Surgery

## 2021-09-24 VITALS — Wt 191.2 lb

## 2021-09-24 DIAGNOSIS — Z483 Aftercare following surgery for neoplasm: Secondary | ICD-10-CM | POA: Insufficient documentation

## 2021-09-24 NOTE — Therapy (Signed)
  OUTPATIENT PHYSICAL THERAPY SOZO SCREENING NOTE   Patient Name: Donna Robbins MRN: [id number] DOB:January 25, 1971, 51 y.o., female Today's Date: 09/24/2021  PCP: Hoyt Koch, MD REFERRING PROVIDER: Coralie Keens, MD   PT End of Session - 09/24/21 1529     Visit Number 14   # unchanged due to screen only   PT Start Time 1526    PT Stop Time 1532    PT Time Calculation (min) 6 min    Activity Tolerance Patient tolerated treatment well    Behavior During Therapy [facility] for tasks assessed/performed             Past Medical History:  Diagnosis Date   Allergy    Anxiety    Breast cancer ([place])    Depression    Hx of adenomatous polyp of colon 07/18/2016   Irritable bowel syndrome    OBESITY    Past Surgical History:  Procedure Laterality Date   CESAREAN SECTION     x's 2   CHOLECYSTECTOMY  2009   ENT surgery  2004   MASTECTOMY W/ SENTINEL NODE BIOPSY Right 12/13/2020   Procedure: RIGHT MASTECTOMY WITH SENTINEL LYMPH NODE BIOPSY;  Surgeon: Coralie Keens, MD;  Location: [place];  Service: General;  Laterality: Right;   Patient Active Problem List   Diagnosis Date Noted   Post-mastectomy pain syndrome 04/25/2021   Cervico-occipital neuralgia of right side 04/25/2021   Trigger point with neuralgia 04/25/2021   Trigeminal neuropathy 04/09/2021   S/P mastectomy, right 12/13/2020   Malignant neoplasm of upper-outer quadrant of right breast in female, estrogen receptor positive ([place]) 11/13/2020   Chronic pain of left knee 04/15/2017   Hx of adenomatous polyp of colon 07/18/2016   Ventral hernia 11/15/2015   Routine general medical examination at a health care facility 11/24/2014   Anxiety    HEARING LOSS 03/14/2010   Obesity 03/06/2010   IRRITABLE BOWEL SYNDROME 03/06/2010    REFERRING DIAG: right breast cancer at risk for lymphedema  THERAPY DIAG:  Aftercare following surgery for neoplasm  PERTINENT HISTORY: Patient was diagnosed on 10/24/2020 with  right grade I invasive ductal carcinoma breast cancer. She underwent a right mastectomy and sentinel node bipsy (5 negative nodes) on 9/21/2022It is ER/PR positive and HER2 negative with a Ki67 of 1%.   PRECAUTIONS: right UE Lymphedema risk, None  SUBJECTIVE: Pt returns for her 3 month L-Dex screen.  PAIN:  Are you having pain? No  SOZO SCREENING: Patient was assessed today using the SOZO machine to determine the lymphedema index score. This was compared to her baseline score. It was determined that she is within the recommended range when compared to her baseline and no further action is needed at this time. She will continue SOZO screenings. These are done every 3 months for 2 years post operatively followed by every 6 months for 2 years, and then annually.    Otelia Limes, PTA 09/24/2021, 3:30 PM

## 2021-09-27 ENCOUNTER — Other Ambulatory Visit: Payer: Self-pay | Admitting: Internal Medicine

## 2021-10-01 ENCOUNTER — Other Ambulatory Visit (HOSPITAL_COMMUNITY): Payer: Self-pay

## 2021-10-01 MED ORDER — CLONAZEPAM 0.5 MG PO TABS
ORAL_TABLET | ORAL | 2 refills | Status: DC
  Filled 2021-10-01: qty 30, 30d supply, fill #0
  Filled 2021-11-22: qty 30, 30d supply, fill #1
  Filled 2022-02-09: qty 30, 30d supply, fill #2

## 2021-10-03 NOTE — Assessment & Plan Note (Signed)
11/08/2020: Clinical T1BN0 stage Ia IDC grade 2, ER/PR positive HER2 negative Ki-67 1% 12/13/2021: Right mastectomy: Grade 1 IDC margins -0/5 lymph nodes negative, Oncotype score 8: 3% ROR Current treatment: Tamoxifen started 01/23/2021 discontinued and switched to Zoladex with anastrozole (due to swelling of extremities) Anastrozole/Zoladex toxicities: Tolerating it better with mild to moderate hot flashes and mild to moderate joint stiffness Because of expense related to Zoladex, we switched back to tamoxifen.  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.  Breast cancer surveillance: 1.  Breast exam 08/21/2021: Benign 2. mammograms to be done in August 2023 at [facility]  Menopause status:  If she does not get any further cycles we might check [place] and estradiol levels in 6 months and reassess if she is truly in menopause.  At that time she could be switched to anastrozole.

## 2021-10-04 ENCOUNTER — Inpatient Hospital Stay: Payer: 59 | Attending: Oncology | Admitting: Hematology and Oncology

## 2021-10-04 DIAGNOSIS — C50411 Malignant neoplasm of upper-outer quadrant of right female breast: Secondary | ICD-10-CM | POA: Diagnosis not present

## 2021-10-04 DIAGNOSIS — Z170 Estrogen receptor positive status [ER+]: Secondary | ICD-10-CM

## 2021-10-05 ENCOUNTER — Telehealth: Payer: Self-pay | Admitting: Hematology and Oncology

## 2021-10-05 DIAGNOSIS — C50911 Malignant neoplasm of unspecified site of right female breast: Secondary | ICD-10-CM | POA: Diagnosis not present

## 2021-10-05 NOTE — Telephone Encounter (Signed)
Scheduled appointment per 7/13 los. Patient is aware.

## 2021-10-15 ENCOUNTER — Other Ambulatory Visit (HOSPITAL_COMMUNITY): Payer: Self-pay

## 2021-10-16 ENCOUNTER — Ambulatory Visit: Payer: 59

## 2021-11-02 ENCOUNTER — Encounter: Payer: Self-pay | Admitting: Hematology and Oncology

## 2021-11-02 ENCOUNTER — Other Ambulatory Visit: Payer: Self-pay | Admitting: *Deleted

## 2021-11-02 DIAGNOSIS — C50411 Malignant neoplasm of upper-outer quadrant of right female breast: Secondary | ICD-10-CM

## 2021-11-02 NOTE — Progress Notes (Signed)
RN successfully faxed left breast screening mammogram to [facility].

## 2021-11-07 DIAGNOSIS — C50911 Malignant neoplasm of unspecified site of right female breast: Secondary | ICD-10-CM | POA: Diagnosis not present

## 2021-11-08 DIAGNOSIS — Z1231 Encounter for screening mammogram for malignant neoplasm of breast: Secondary | ICD-10-CM | POA: Diagnosis not present

## 2021-11-22 ENCOUNTER — Other Ambulatory Visit (HOSPITAL_COMMUNITY): Payer: Self-pay

## 2021-11-22 ENCOUNTER — Other Ambulatory Visit: Payer: Self-pay | Admitting: Internal Medicine

## 2021-11-22 MED ORDER — GABAPENTIN 300 MG PO CAPS
300.0000 mg | ORAL_CAPSULE | Freq: Two times a day (BID) | ORAL | 3 refills | Status: DC
  Filled 2021-11-22: qty 60, 30d supply, fill #0
  Filled 2022-02-09: qty 60, 30d supply, fill #1
  Filled 2022-04-11 – 2022-04-16 (×2): qty 60, 30d supply, fill #2
  Filled 2022-06-03: qty 60, 30d supply, fill #3

## 2021-11-23 ENCOUNTER — Other Ambulatory Visit: Payer: Self-pay

## 2021-11-28 ENCOUNTER — Other Ambulatory Visit (HOSPITAL_COMMUNITY): Payer: Self-pay

## 2021-12-31 ENCOUNTER — Ambulatory Visit: Payer: 59 | Attending: Surgery

## 2021-12-31 DIAGNOSIS — Z483 Aftercare following surgery for neoplasm: Secondary | ICD-10-CM | POA: Insufficient documentation

## 2022-01-17 ENCOUNTER — Encounter: Payer: Self-pay | Admitting: Adult Health

## 2022-02-04 ENCOUNTER — Ambulatory Visit: Payer: 59 | Attending: Surgery

## 2022-02-04 VITALS — Wt 198.5 lb

## 2022-02-04 DIAGNOSIS — Z483 Aftercare following surgery for neoplasm: Secondary | ICD-10-CM | POA: Insufficient documentation

## 2022-02-04 NOTE — Therapy (Signed)
  OUTPATIENT PHYSICAL THERAPY SOZO SCREENING NOTE   Patient Name: Donna Robbins MRN: [id number] DOB:[birth year], 51 y.o., female Today's Date: 02/04/2022  PCP: Hoyt Koch, MD REFERRING PROVIDER: Coralie Keens, MD   PT End of Session - 02/04/22 1513     Visit Number 14   # unchanged due to screen only   PT Start Time 1513    PT Stop Time 1518    PT Time Calculation (min) 5 min    Activity Tolerance Patient tolerated treatment well    Behavior During Therapy [facility] for tasks assessed/performed             Past Medical History:  Diagnosis Date   Allergy    Anxiety    Breast cancer ([place])    Depression    Hx of adenomatous polyp of colon 07/18/2016   Irritable bowel syndrome    OBESITY    Past Surgical History:  Procedure Laterality Date   CESAREAN SECTION     x's 2   CHOLECYSTECTOMY  2009   ENT surgery  2004   MASTECTOMY W/ SENTINEL NODE BIOPSY Right 12/13/2020   Procedure: RIGHT MASTECTOMY WITH SENTINEL LYMPH NODE BIOPSY;  Surgeon: Coralie Keens, MD;  Location: [place];  Service: General;  Laterality: Right;   Patient Active Problem List   Diagnosis Date Noted   Post-mastectomy pain syndrome 04/25/2021   Cervico-occipital neuralgia of right side 04/25/2021   Trigger point with neuralgia 04/25/2021   Trigeminal neuropathy 04/09/2021   S/P mastectomy, right 12/13/2020   Malignant neoplasm of upper-outer quadrant of right breast in female, estrogen receptor positive ([place]) 11/13/2020   Chronic pain of left knee 04/15/2017   Hx of adenomatous polyp of colon 07/18/2016   Ventral hernia 11/15/2015   Routine general medical examination at a health care facility 11/24/2014   Anxiety    HEARING LOSS 03/14/2010   Obesity 03/06/2010   IRRITABLE BOWEL SYNDROME 03/06/2010    REFERRING DIAG: right breast cancer at risk for lymphedema  THERAPY DIAG: Aftercare following surgery for neoplasm  PERTINENT HISTORY: Patient was diagnosed on 10/24/2020 with  right grade I invasive ductal carcinoma breast cancer. She underwent a right mastectomy and sentinel node bipsy (5 negative nodes) on 9/21/2022It is ER/PR positive and HER2 negative with a Ki67 of 1%.   PRECAUTIONS: right UE Lymphedema risk, None  SUBJECTIVE: Pt returns for her 3 month L-Dex screen.  PAIN:  Are you having pain? No  SOZO SCREENING: Patient was assessed today using the SOZO machine to determine the lymphedema index score. This was compared to her baseline score. It was determined that she is within the recommended range when compared to her baseline and no further action is needed at this time. She will continue SOZO screenings. These are done every 3 months for 2 years post operatively followed by every 6 months for 2 years, and then annually.   L-DEX FLOWSHEETS - 02/04/22 1500       L-DEX LYMPHEDEMA SCREENING   Measurement Type Unilateral    L-DEX MEASUREMENT EXTREMITY Upper Extremity    POSITION  Standing    DOMINANT SIDE Right    At Risk Side Right    BASELINE SCORE (UNILATERAL) 0.5    L-DEX SCORE (UNILATERAL) -2    VALUE CHANGE (UNILAT) -2.5              Otelia Limes, PTA 02/04/2022, 3:17 PM

## 2022-02-10 ENCOUNTER — Other Ambulatory Visit (HOSPITAL_COMMUNITY): Payer: Self-pay

## 2022-02-11 ENCOUNTER — Other Ambulatory Visit (HOSPITAL_COMMUNITY): Payer: Self-pay

## 2022-02-11 ENCOUNTER — Encounter: Payer: 59 | Admitting: Internal Medicine

## 2022-02-28 ENCOUNTER — Other Ambulatory Visit (HOSPITAL_COMMUNITY): Payer: Self-pay

## 2022-03-07 ENCOUNTER — Encounter: Payer: Self-pay | Admitting: Adult Health

## 2022-03-09 ENCOUNTER — Encounter: Payer: Self-pay | Admitting: Adult Health

## 2022-03-28 ENCOUNTER — Encounter: Payer: Self-pay | Admitting: Adult Health

## 2022-04-10 DIAGNOSIS — D2272 Melanocytic nevi of left lower limb, including hip: Secondary | ICD-10-CM | POA: Diagnosis not present

## 2022-04-10 DIAGNOSIS — D2261 Melanocytic nevi of right upper limb, including shoulder: Secondary | ICD-10-CM | POA: Diagnosis not present

## 2022-04-10 DIAGNOSIS — D2262 Melanocytic nevi of left upper limb, including shoulder: Secondary | ICD-10-CM | POA: Diagnosis not present

## 2022-04-10 DIAGNOSIS — L812 Freckles: Secondary | ICD-10-CM | POA: Diagnosis not present

## 2022-04-10 DIAGNOSIS — D2271 Melanocytic nevi of right lower limb, including hip: Secondary | ICD-10-CM | POA: Diagnosis not present

## 2022-04-10 DIAGNOSIS — D1801 Hemangioma of skin and subcutaneous tissue: Secondary | ICD-10-CM | POA: Diagnosis not present

## 2022-04-10 DIAGNOSIS — L821 Other seborrheic keratosis: Secondary | ICD-10-CM | POA: Diagnosis not present

## 2022-04-10 DIAGNOSIS — D225 Melanocytic nevi of trunk: Secondary | ICD-10-CM | POA: Diagnosis not present

## 2022-04-11 ENCOUNTER — Other Ambulatory Visit: Payer: Self-pay | Admitting: Internal Medicine

## 2022-04-12 ENCOUNTER — Other Ambulatory Visit (HOSPITAL_COMMUNITY): Payer: Self-pay

## 2022-04-12 ENCOUNTER — Other Ambulatory Visit: Payer: Self-pay

## 2022-04-12 MED ORDER — CLONAZEPAM 0.5 MG PO TABS
0.2500 mg | ORAL_TABLET | Freq: Every day | ORAL | 2 refills | Status: DC
  Filled 2022-04-12 – 2022-04-16 (×2): qty 30, 30d supply, fill #0
  Filled 2022-06-03: qty 30, 30d supply, fill #1
  Filled 2022-07-23: qty 30, 30d supply, fill #2

## 2022-04-16 ENCOUNTER — Other Ambulatory Visit: Payer: Self-pay

## 2022-04-16 ENCOUNTER — Other Ambulatory Visit (HOSPITAL_COMMUNITY): Payer: Self-pay

## 2022-04-17 ENCOUNTER — Encounter: Payer: Self-pay | Admitting: Internal Medicine

## 2022-04-17 ENCOUNTER — Ambulatory Visit (INDEPENDENT_AMBULATORY_CARE_PROVIDER_SITE_OTHER): Payer: 59 | Admitting: Internal Medicine

## 2022-04-17 VITALS — BP 122/82 | HR 85 | Temp 98.8°F | Ht 63.0 in | Wt 206.8 lb

## 2022-04-17 DIAGNOSIS — Z170 Estrogen receptor positive status [ER+]: Secondary | ICD-10-CM | POA: Diagnosis not present

## 2022-04-17 DIAGNOSIS — Z0000 Encounter for general adult medical examination without abnormal findings: Secondary | ICD-10-CM | POA: Diagnosis not present

## 2022-04-17 DIAGNOSIS — Z6836 Body mass index (BMI) 36.0-36.9, adult: Secondary | ICD-10-CM | POA: Diagnosis not present

## 2022-04-17 DIAGNOSIS — E669 Obesity, unspecified: Secondary | ICD-10-CM | POA: Diagnosis not present

## 2022-04-17 DIAGNOSIS — C50411 Malignant neoplasm of upper-outer quadrant of right female breast: Secondary | ICD-10-CM | POA: Diagnosis not present

## 2022-04-17 LAB — LIPID PANEL
Cholesterol: 158 mg/dL (ref 0–200)
HDL: 50.3 mg/dL (ref >39.00)
LDL Cholesterol: 92 mg/dL (ref 0–99)
NonHDL: 108.15
Total CHOL/HDL Ratio: 3
Triglycerides: 81 mg/dL (ref 0.0–149.0)
VLDL: 16.2 mg/dL (ref 0.0–40.0)

## 2022-04-17 LAB — COMPREHENSIVE METABOLIC PANEL
ALT: 13 U/L (ref 0–35)
AST: 17 U/L (ref 0–37)
Albumin: 4.2 g/dL (ref 3.5–5.2)
Alkaline Phosphatase: 54 U/L (ref 39–117)
BUN: 12 mg/dL (ref 6–23)
CO2: 31 mEq/L (ref 19–32)
Calcium: 9.2 mg/dL (ref 8.4–10.5)
Chloride: 105 mEq/L (ref 96–112)
Creatinine, Ser: 0.58 mg/dL (ref 0.40–1.20)
GFR: 103.92 mL/min (ref >60.00)
Glucose, Bld: 91 mg/dL (ref 70–99)
Potassium: 4.7 mEq/L (ref 3.5–5.1)
Sodium: 143 mEq/L (ref 135–145)
Total Bilirubin: 0.4 mg/dL (ref 0.2–1.2)
Total Protein: 7.4 g/dL (ref 6.0–8.3)

## 2022-04-17 LAB — CBC
HCT: 38.8 % (ref 36.0–46.0)
Hemoglobin: 13 g/dL (ref 12.0–15.0)
MCHC: 33.4 g/dL (ref 30.0–36.0)
MCV: 95 fl (ref 78.0–100.0)
Platelets: 318 10*3/uL (ref 150.0–400.0)
RBC: 4.09 Mil/uL (ref 3.87–5.11)
RDW: 13 % (ref 11.5–15.5)
WBC: 6 10*3/uL (ref 4.0–10.5)

## 2022-04-17 NOTE — Progress Notes (Unsigned)
   Subjective:   Patient ID: Donna Robbins, female    DOB: March 07, 1971, 52 y.o.   MRN: [id number]  HPI The patient is here for physical.  PMH, [facility], social history reviewed and updated  Review of Systems  Constitutional: Negative.   HENT: Negative.    Eyes: Negative.   Respiratory:  Negative for cough, chest tightness and shortness of breath.   Cardiovascular:  Negative for chest pain, palpitations and leg swelling.  Gastrointestinal:  Negative for abdominal distention, abdominal pain, constipation, diarrhea, nausea and vomiting.  Musculoskeletal: Negative.   Skin: Negative.   Neurological: Negative.   Psychiatric/Behavioral: Negative.      Objective:  Physical Exam Constitutional:      Appearance: She is well-developed.  HENT:     Head: Normocephalic and atraumatic.  Cardiovascular:     Rate and Rhythm: Normal rate and regular rhythm.  Pulmonary:     Effort: Pulmonary effort is normal. No respiratory distress.     Breath sounds: Normal breath sounds. No wheezing or rales.  Abdominal:     General: Bowel sounds are normal. There is no distension.     Palpations: Abdomen is soft.     Tenderness: There is no abdominal tenderness. There is no rebound.  Musculoskeletal:     Cervical back: Normal range of motion.  Skin:    General: Skin is warm and dry.  Neurological:     Mental Status: She is alert and oriented to person, place, and time.     Coordination: Coordination normal.     Vitals:   04/17/22 0856  BP: 122/82  Pulse: 85  Temp: 98.8 F (37.1 C)  TempSrc: Oral  SpO2: 98%  Weight: 206 lb 12.8 oz (93.8 kg)  Height: '5\' 3"'$  (1.6 m)    Assessment & Plan:

## 2022-04-18 NOTE — Assessment & Plan Note (Signed)
Continues on tamoxifen 10 mg daily and significant side effects. Plan currently for 5 years of treatment. Up to date on mammogram and no new lumps.

## 2022-04-18 NOTE — Assessment & Plan Note (Signed)
Flu shot up to date. Covid-19 counseled. Shingrix complete. Tetanus up to date. Colonoscopy up to date. Mammogram up to date, pap smear up to date with gyn. Counseled about sun safety and mole surveillance. Counseled about the dangers of distracted driving. Given 10 year screening recommendations.

## 2022-04-18 NOTE — Assessment & Plan Note (Signed)
Counseled about diet and exercise. Her cancer journey has caused some weight gain.

## 2022-06-03 ENCOUNTER — Other Ambulatory Visit: Payer: Self-pay

## 2022-06-03 ENCOUNTER — Other Ambulatory Visit (HOSPITAL_COMMUNITY): Payer: Self-pay

## 2022-06-24 ENCOUNTER — Encounter: Payer: Self-pay | Admitting: Adult Health

## 2022-06-25 ENCOUNTER — Other Ambulatory Visit: Payer: Self-pay | Admitting: *Deleted

## 2022-06-25 ENCOUNTER — Other Ambulatory Visit: Payer: Self-pay

## 2022-06-25 ENCOUNTER — Other Ambulatory Visit (HOSPITAL_COMMUNITY): Payer: Self-pay

## 2022-06-25 ENCOUNTER — Encounter: Payer: Self-pay | Admitting: Hematology and Oncology

## 2022-06-25 ENCOUNTER — Encounter: Payer: Self-pay | Admitting: Adult Health

## 2022-06-25 MED ORDER — GABAPENTIN 100 MG PO CAPS
100.0000 mg | ORAL_CAPSULE | ORAL | 3 refills | Status: DC
  Filled 2022-06-25: qty 30, 30d supply, fill #0
  Filled 2022-08-29: qty 30, 30d supply, fill #1

## 2022-06-25 NOTE — Progress Notes (Signed)
Received message from pt with complaint of onging right sided neuropathy that is alleviated with Gabapentin 300 mg p.o QHS.  Pt states pain occurs during the day but the 300 mg dose makes her drowsy and is unable to fully work.  RN reviewed with MD and verbal orders received for pt to start Gabapentin 100 mg p.o daily in the AM and continue the Gabapentin 300 mg p.o QHS. Pt will f/u in office in 1 month to assess tolerance.  Prescription sent to pharmacy on file, pt educated and verbalized understanding.

## 2022-06-26 ENCOUNTER — Other Ambulatory Visit (HOSPITAL_COMMUNITY): Payer: Self-pay

## 2022-06-27 ENCOUNTER — Telehealth: Payer: Self-pay | Admitting: Hematology and Oncology

## 2022-06-27 NOTE — Telephone Encounter (Signed)
Scheduled appointment per staff message. Patient is aware of the made appointment. 

## 2022-07-08 NOTE — Progress Notes (Signed)
HEMATOLOGY-ONCOLOGY TELEPHONE VISIT PROGRESS NOTE  I connected with our patient on 07/09/22 at  9:00 AM EDT by telephone and verified that I am speaking with the correct person using two identifiers.  I discussed the limitations, risks, security and privacy concerns of performing an evaluation and management service by telephone and the availability of in person appointments.  I also discussed with the patient that there may be a patient responsible charge related to this service. The patient expressed understanding and agreed to proceed.   History of Present Illness:   Oncology History  Malignant neoplasm of upper-outer quadrant of right breast in female, estrogen receptor positive  11/06/2020 Mammogram   Exam: 3D Mammogram Diagnostic - Right Breast Ultrasound - Right  IMPRESSION: The 0.8 cm irregular mass in the right breast at 9 o'clock is highly suggestive of malignancy. Ultrasound of the right axilla is negative for lymphadenopathy.   11/08/2020 Initial Biopsy   Diagnosis Breast, right, needle core biopsy, 9 o'clock 10cm fn posterior depth - INVASIVE DUCTAL CARCINOMA, GRADE 1/2. - DUCTAL CARCINOMA IN SITU. - SEE MICROSCOPIC DESCRIPTION.  Microscopic Comment The greatest tumor dimension is 0.5 cm.  PROGNOSTIC INDICATORS Results: The tumor cells are NEGATIVE for Her2 (0). Estrogen Receptor: 100%, POSITIVE, STRONG STAINING INTENSITY Progesterone Receptor: 100%, POSITIVE, STRONG STAINING INTENSITY Proliferation Marker Ki67: 1%   11/13/2020 Initial Diagnosis   Malignant neoplasm of upper-outer quadrant of right breast in female, estrogen receptor positive (HCC)   11/15/2020 Cancer Staging   Staging form: Breast, AJCC 8th Edition - Clinical stage from 11/15/2020: Stage IA (cT1b, cN0, cM0, G1, ER+, PR+, HER2-) - Signed by Lowella Dell, MD on 11/15/2020 Stage prefix: Initial diagnosis Histologic grading system: 3 grade system   12/13/2020 Cancer Staging   Staging form:  Breast, AJCC 8th Edition - Pathologic stage from 12/13/2020: Stage IA (pT1b, pN0, cM0, G1, ER+, PR+, HER2-) - Signed by Loa Socks, NP on 03/20/2021 Stage prefix: Initial diagnosis Histologic grading system: 3 grade system   12/13/2020 Definitive Surgery   FINAL MICROSCOPIC DIAGNOSIS:   A. BREAST, RIGHT, MASTECTOMY:  - Invasive and in situ ductal carcinoma, 0.9 cm.  - Margins not involved.  - Biopsy site and biopsy clip.  - See oncology table.   B. LYMPH NODE, RIGHT AXILLARY #1, SENTINEL, EXCISION:  - One lymph node negative for metastatic carcinoma (0/1).   C. LYMPH NODE, RIGHT AXILLARY #2, SENTINEL, EXCISION:  - One lymph node negative for metastatic carcinoma (0/1).   D. LYMPH NODE, RIGHT AXILLARY, SENTINEL, EXCISION:  - One lymph node negative for metastatic carcinoma (0/1).   E. LYMPH NODE, RIGHT AXILLARY, SENTINEL, EXCISION:  - One lymph node negative for metastatic carcinoma (0/1).   F. LYMPH NODE, RIGHT AXILLARY, SENTINEL, EXCISION:  - One lymph node negative for metastatic carcinoma (0/1).    12/13/2020 Oncotype testing   Oncotype DX was obtained on the final surgical sample and the recurrence score of 8 predicts a risk of recurrence outside the breast over the next 9 years of 3%, if the patient's only systemic therapy is an antiestrogen for 5 years.  It also predicts a <1% benefit from chemotherapy.   01/2021 -  Anti-estrogen oral therapy   Tamoxifen daily     REVIEW OF SYSTEMS:   Constitutional: Denies fevers, chills or abnormal weight loss All other systems were reviewed with the patient and are negative. Observations/Objective:     Assessment Plan:  Malignant neoplasm of upper-outer quadrant of right breast in female, estrogen receptor  positive (HCC) 11/08/2020: Clinical T1BN0 stage Ia IDC grade 2, ER/PR positive HER2 negative Ki-67 1% 12/13/2021: Right mastectomy: Grade 1 IDC margins -0/5 lymph nodes negative, Oncotype score 8: 3% ROR Current  treatment: Tamoxifen started 01/23/2021 discontinued and switched to Zoladex with anastrozole (due to swelling of extremities) Anastrozole/Zoladex toxicities: Tolerating it better with mild to moderate hot flashes and mild to moderate joint stiffness Because of expense related to Zoladex, we switched back to tamoxifen.   Tamoxifen Toxicities: Tolerating 10 mg dose Lot less swelling of legs and face Hot flashes have improved  Neuropathic symptoms: in arms. Causing difficulties towards the end of the day. Took Gabapentin 100 mg but its not being effective. Trying to walk and get exercise.   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  By the middle to end of the day she is getting a lot of symptoms in the right arm and it is making her miserable and slightly angry which bothers her.   Breast cancer surveillance: 1.  Breast exam 07/09/2022: Benign 2. mammograms 11/08/2021 at [facility]: Benign breast density category B  Recommended obtaining FSH and estradiol levels to determine if she is menopausal but we can switch her to anastrozole therapy. She will try to do this blood work with her gynecologist when she meets with them.   I discussed the assessment and treatment plan with the patient. The patient was provided an opportunity to ask questions and all were answered. The patient agreed with the plan and demonstrated an understanding of the instructions. The patient was advised to call back or seek an in-person evaluation if the symptoms worsen or if the condition fails to improve as anticipated.   I provided 12 minutes of non-face-to-face time during this encounter.  This includes time for charting and coordination of care   Tamsen Meek, MD  I Janan Ridge am acting as a scribe for Dr.Vinay Gudena  I have reviewed the above documentation for accuracy and completeness, and I agree with the above.

## 2022-07-09 ENCOUNTER — Inpatient Hospital Stay: Payer: 59 | Attending: Hematology and Oncology | Admitting: Hematology and Oncology

## 2022-07-09 DIAGNOSIS — C50411 Malignant neoplasm of upper-outer quadrant of right female breast: Secondary | ICD-10-CM

## 2022-07-09 DIAGNOSIS — Z170 Estrogen receptor positive status [ER+]: Secondary | ICD-10-CM

## 2022-07-09 NOTE — Assessment & Plan Note (Signed)
11/08/2020: Clinical T1BN0 stage Ia IDC grade 2, ER/PR positive HER2 negative Ki-67 1% 12/13/2021: Right mastectomy: Grade 1 IDC margins -0/5 lymph nodes negative, Oncotype score 8: 3% ROR Current treatment: Tamoxifen started 01/23/2021 discontinued and switched to Zoladex with anastrozole (due to swelling of extremities) Anastrozole/Zoladex toxicities: Tolerating it better with mild to moderate hot flashes and mild to moderate joint stiffness Because of expense related to Zoladex, we switched back to tamoxifen.   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   Breast cancer surveillance: 1.  Breast exam 07/09/2022: Benign 2. mammograms 11/08/2021 at [facility]: Benign breast density category B  Recommended obtaining FSH and estradiol levels to determine if she is menopausal but we can switch her to anastrozole therapy.

## 2022-07-15 ENCOUNTER — Telehealth: Payer: 59 | Admitting: Physician Assistant

## 2022-07-15 ENCOUNTER — Encounter: Payer: Self-pay | Admitting: Adult Health

## 2022-07-15 ENCOUNTER — Other Ambulatory Visit (HOSPITAL_COMMUNITY): Payer: Self-pay

## 2022-07-15 DIAGNOSIS — L255 Unspecified contact dermatitis due to plants, except food: Secondary | ICD-10-CM

## 2022-07-15 MED ORDER — PREDNISONE 10 MG PO TABS
ORAL_TABLET | ORAL | 0 refills | Status: DC
  Filled 2022-07-15: qty 37, 14d supply, fill #0

## 2022-07-15 NOTE — Progress Notes (Signed)
E-Visit for Poison Ivy  We are sorry that you are not feeing well.  Here is how we plan to help!  Based on what you have shared with me it looks like you have had an allergic reaction to the oily resin from a group of plants.  This resin is very sticky, so it easily attaches to your skin, clothing, tools equipment, and pet's fur.    This blistering rash is often called poison ivy rash although it can come from contact with the leaves, stems and roots of poison ivy, poison oak and poison sumac.  The oily resin contains urushiol (u-ROO-she-ol) that produces a skin rash on exposed skin.  The severity of the rash depends on the amount of urushiol that gets on your skin.  A section of skin with more urushiol on it may develop a rash sooner.  The rash usually develops 12-48 hours after exposure and can last two to three weeks.  Your skin must come in direct contact with the plant's oil to be affected.  Blister fluid doesn't spread the rash.  However, if you come into contact with a piece of clothing or pet fur that has urushiol on it, the rash may spread out.  You can also transfer the oil to other parts of your body with your fingers.  Often the rash looks like a straight line because of the way the plant brushes against your skin.  Since your rash is widespread or has resulted in a large number of blisters, I have prescribed an oral corticosteroid.  Please follow these recommendations:  I have sent a prednisone dose pack to your chosen pharmacy. Be sure to follow the instructions carefully and complete the entire prescription. You may use Benadryl or Caladryl topical lotions to sooth the itch and remember cool, not hot, showers and baths can help relieve the itching!  Place cool, wet compresses on the affected area for 15-30 minutes several times a day.  You may also take oral antihistamines, such as diphenhydramine (Benadryl, others), which may also help you sleep better.  Watch your skin for any purulent  (pus) drainage or red streaking from the site.  If this occurs, contact your provider.  You may require an antibiotic for a skin infection.  Make sure that the clothes you were wearing as well as any towels or sheets that may have come in contact with the oil (urushiol) are washed in detergent and hot water.       I have developed the following plan to treat your condition I am prescribing a two week course of steroids (37 tablets of 10 mg prednisone).  Days 1-4 take 4 tablets (40 mg) daily  Days 5-8 take 3 tablets (30 mg) daily, Days 9-11 take 2 tablets (20 mg) daily, Days 12-14 take 1 tablet (10 mg) daily.    What can you do to prevent this rash?  Avoid the plants.  Learn how to identify poison ivy, poison oak and poison sumac in all seasons.  When hiking or engaging in other activities that might expose you to these plants, try to stay on cleared pathways.  If camping, make sure you pitch your tent in an area free of these plants.  Keep pets from running through wooded areas so that urushiol doesn't accidentally stick to their fur, which you may touch.  Remove or kill the plants.  In your yard, you can get rid of poison ivy by applying an herbicide or pulling it out of   the ground, including the roots, while wearing heavy gloves.  Afterward remove the gloves and thoroughly wash them and your hands.  Don't burn poison ivy or related plants because the urushiol can be carried by smoke.  Wear protective clothing.  If needed, protect your skin by wearing socks, boots, pants, long sleeves and vinyl gloves.  Wash your skin right away.  Washing off the oil with soap and water within 30 minutes of exposure may reduce your chances of getting a poison ivy rash.  Even washing after an hour or so can help reduce the severity of the rash.  If you walk through some poison ivy and then later touch your shoes, you may get some urushiol on your hands, which may then transfer to your face or body by touching or  rubbing.  If the contaminated object isn't cleaned, the urushiol on it can still cause a skin reaction years later.    Be careful not to reuse towels after you have washed your skin.  Also carefully wash clothing in detergent and hot water to remove all traces of the oil.  Handle contaminated clothing carefully so you don't transfer the urushiol to yourself, furniture, rugs or appliances.  Remember that pets can carry the oil on their fur and paws.  If you think your pet may be contaminated with urushiol, put on some long rubber gloves and give your pet a bath.  Finally, be careful not to burn these plants as the smoke can contain traces of the oil.  Inhaling the smoke may result in difficulty breathing. If that occurred you should see a physician as soon as possible.  See your doctor right away if:  The reaction is severe or widespread You inhaled the smoke from burning poison ivy and are having difficulty breathing Your skin continues to swell The rash affects your eyes, mouth or genitals Blisters are oozing pus You develop a fever greater than 100 F (37.8 C) The rash doesn't get better within a few weeks.  If you scratch the poison ivy rash, bacteria under your fingernails may cause the skin to become infected.  See your doctor if pus starts oozing from the blisters.  Treatment generally includes antibiotics.  Poison ivy treatments are usually limited to self-care methods.  And the rash typically goes away on its own in two to three weeks.     If the rash is widespread or results in a large number of blisters, your doctor may prescribe an oral corticosteroid, such as prednisone.  If a bacterial infection has developed at the rash site, your doctor may give you a prescription for an oral antibiotic.  MAKE SURE YOU  Understand these instructions. Will watch your condition. Will get help right away if you are not doing well or get worse.   Thank you for choosing an e-visit.  Your  e-visit answers were reviewed by a board certified advanced clinical practitioner to complete your personal care plan. Depending upon the condition, your plan could have included both over the counter or prescription medications.  Please review your pharmacy choice. Make sure the pharmacy is open so you can pick up prescription now. If there is a problem, you may contact your provider through MyChart messaging and have the prescription routed to another pharmacy.  Your safety is important to us. If you have drug allergies check your prescription carefully.   For the next 24 hours you can use MyChart to ask questions about today's visit, request a non-urgent   call back, or ask for a work or school excuse. You will get an email in the next two days asking about your experience. I hope that your e-visit has been valuable and will speed your recovery.   I have spent 5 minutes in review of e-visit questionnaire, review and updating patient chart, medical decision making and response to patient.   Margaretann Loveless, PA-C

## 2022-07-23 ENCOUNTER — Other Ambulatory Visit: Payer: Self-pay | Admitting: Internal Medicine

## 2022-07-24 ENCOUNTER — Other Ambulatory Visit (HOSPITAL_COMMUNITY): Payer: Self-pay

## 2022-07-24 ENCOUNTER — Encounter: Payer: Self-pay | Admitting: Adult Health

## 2022-07-24 ENCOUNTER — Other Ambulatory Visit: Payer: Self-pay

## 2022-07-24 MED ORDER — GABAPENTIN 300 MG PO CAPS
300.0000 mg | ORAL_CAPSULE | Freq: Two times a day (BID) | ORAL | 3 refills | Status: DC
  Filled 2022-07-24: qty 60, 30d supply, fill #0
  Filled 2022-08-29: qty 60, 30d supply, fill #1

## 2022-07-31 ENCOUNTER — Other Ambulatory Visit: Payer: Self-pay | Admitting: Hematology and Oncology

## 2022-08-05 ENCOUNTER — Ambulatory Visit: Payer: Commercial Managed Care - PPO | Attending: Surgery

## 2022-08-05 VITALS — Wt 211.4 lb

## 2022-08-05 DIAGNOSIS — Z483 Aftercare following surgery for neoplasm: Secondary | ICD-10-CM

## 2022-08-05 NOTE — Therapy (Signed)
  OUTPATIENT PHYSICAL THERAPY SOZO SCREENING NOTE   Patient Name: Donna Robbins MRN: [id number] DOB:04/22/1970, 52 y.o., female Today's Date: 08/05/2022  PCP: Myrlene Broker, MD REFERRING PROVIDER: Abigail Miyamoto, MD   PT End of Session - 08/05/22 1456     Visit Number 14   # unchanged due to screen only   PT Start Time 1440    PT Stop Time 1445    PT Time Calculation (min) 5 min    Activity Tolerance Patient tolerated treatment well    Behavior During Therapy [facility] for tasks assessed/performed             Past Medical History:  Diagnosis Date   Allergy    Anxiety    Breast cancer (HCC)    Depression    Hx of adenomatous polyp of colon 07/18/2016   Irritable bowel syndrome    OBESITY    Past Surgical History:  Procedure Laterality Date   CESAREAN SECTION     x's 2   CHOLECYSTECTOMY  2009   ENT surgery  2004   MASTECTOMY W/ SENTINEL NODE BIOPSY Right 12/13/2020   Procedure: RIGHT MASTECTOMY WITH SENTINEL LYMPH NODE BIOPSY;  Surgeon: Abigail Miyamoto, MD;  Location: [facility] OR;  Service: General;  Laterality: Right;   Patient Active Problem List   Diagnosis Date Noted   Post-mastectomy pain syndrome 04/25/2021   Cervico-occipital neuralgia of right side 04/25/2021   Trigeminal neuropathy 04/09/2021   S/P mastectomy, right 12/13/2020   Malignant neoplasm of upper-outer quadrant of right breast in female, estrogen receptor positive (HCC) 11/13/2020   Chronic pain of left knee 04/15/2017   Hx of adenomatous polyp of colon 07/18/2016   Ventral hernia 11/15/2015   Routine general medical examination at a health care facility 11/24/2014   Anxiety    HEARING LOSS 03/14/2010   Obesity 03/06/2010   IRRITABLE BOWEL SYNDROME 03/06/2010    REFERRING DIAG: right breast cancer at risk for lymphedema  THERAPY DIAG: Aftercare following surgery for neoplasm  PERTINENT HISTORY: Patient was diagnosed on 10/24/2020 with right grade I invasive ductal carcinoma breast  cancer. She underwent a right mastectomy and sentinel node bipsy (5 negative nodes) on 9/21/2022It is ER/PR positive and HER2 negative with a Ki67 of 1%.   PRECAUTIONS: right UE Lymphedema risk, None  SUBJECTIVE: Pt returns for her 3 month L-Dex screen.  PAIN:  Are you having pain? No  SOZO SCREENING: Patient was assessed today using the SOZO machine to determine the lymphedema index score. This was compared to her baseline score. It was determined that she is within the recommended range when compared to her baseline and no further action is needed at this time. She will continue SOZO screenings. These are done every 3 months for 2 years post operatively followed by every 6 months for 2 years, and then annually.   L-DEX FLOWSHEETS - 08/05/22 1400       L-DEX LYMPHEDEMA SCREENING   Measurement Type Unilateral    L-DEX MEASUREMENT EXTREMITY Upper Extremity    POSITION  Standing    DOMINANT SIDE Right    At Risk Side Right    BASELINE SCORE (UNILATERAL) 0.5    L-DEX SCORE (UNILATERAL) -3.4    VALUE CHANGE (UNILAT) -3.9              Hermenia Bers, PTA 08/05/2022, 2:58 PM

## 2022-08-06 DIAGNOSIS — C50911 Malignant neoplasm of unspecified site of right female breast: Secondary | ICD-10-CM | POA: Diagnosis not present

## 2022-08-09 ENCOUNTER — Encounter: Payer: Self-pay | Admitting: Internal Medicine

## 2022-08-20 ENCOUNTER — Other Ambulatory Visit: Payer: Self-pay

## 2022-08-29 ENCOUNTER — Other Ambulatory Visit: Payer: Self-pay | Admitting: Internal Medicine

## 2022-08-30 ENCOUNTER — Other Ambulatory Visit (HOSPITAL_COMMUNITY): Payer: Self-pay

## 2022-08-30 ENCOUNTER — Other Ambulatory Visit: Payer: Self-pay

## 2022-08-30 MED ORDER — CLONAZEPAM 0.5 MG PO TABS
0.2500 mg | ORAL_TABLET | Freq: Every day | ORAL | 0 refills | Status: DC
  Filled 2022-08-30: qty 30, 30d supply, fill #0

## 2022-09-01 DIAGNOSIS — G44309 Post-traumatic headache, unspecified, not intractable: Secondary | ICD-10-CM | POA: Diagnosis not present

## 2022-09-07 ENCOUNTER — Other Ambulatory Visit: Payer: Self-pay | Admitting: Hematology and Oncology

## 2022-09-09 ENCOUNTER — Other Ambulatory Visit (HOSPITAL_COMMUNITY): Payer: Self-pay

## 2022-09-09 MED ORDER — TAMOXIFEN CITRATE 10 MG PO TABS
10.0000 mg | ORAL_TABLET | Freq: Every day | ORAL | 3 refills | Status: DC
  Filled 2022-09-09 (×2): qty 90, 90d supply, fill #0
  Filled 2022-12-10: qty 90, 90d supply, fill #1
  Filled 2023-03-04 (×2): qty 90, 90d supply, fill #2
  Filled 2023-06-13: qty 90, 90d supply, fill #3

## 2022-10-04 ENCOUNTER — Telehealth: Payer: Self-pay | Admitting: Hematology and Oncology

## 2022-10-07 ENCOUNTER — Inpatient Hospital Stay: Payer: 59 | Admitting: Hematology and Oncology

## 2022-10-28 ENCOUNTER — Ambulatory Visit: Payer: 59

## 2022-11-14 DIAGNOSIS — Z124 Encounter for screening for malignant neoplasm of cervix: Secondary | ICD-10-CM | POA: Diagnosis not present

## 2022-11-14 DIAGNOSIS — Z1231 Encounter for screening mammogram for malignant neoplasm of breast: Secondary | ICD-10-CM | POA: Diagnosis not present

## 2022-11-14 DIAGNOSIS — Z113 Encounter for screening for infections with a predominantly sexual mode of transmission: Secondary | ICD-10-CM | POA: Diagnosis not present

## 2022-11-14 DIAGNOSIS — Z01419 Encounter for gynecological examination (general) (routine) without abnormal findings: Secondary | ICD-10-CM | POA: Diagnosis not present

## 2022-11-14 DIAGNOSIS — N911 Secondary amenorrhea: Secondary | ICD-10-CM | POA: Diagnosis not present

## 2022-11-14 DIAGNOSIS — Z01411 Encounter for gynecological examination (general) (routine) with abnormal findings: Secondary | ICD-10-CM | POA: Diagnosis not present

## 2022-11-14 LAB — HM MAMMOGRAPHY

## 2022-11-18 ENCOUNTER — Encounter: Payer: Self-pay | Admitting: Internal Medicine

## 2022-11-19 ENCOUNTER — Inpatient Hospital Stay: Payer: 59 | Attending: Hematology and Oncology | Admitting: Hematology and Oncology

## 2022-11-19 VITALS — BP 128/85 | HR 97 | Temp 97.5°F | Resp 18 | Ht 63.0 in | Wt 204.3 lb

## 2022-11-19 DIAGNOSIS — R000 Tachycardia, unspecified: Secondary | ICD-10-CM | POA: Diagnosis not present

## 2022-11-19 DIAGNOSIS — Z79810 Long term (current) use of selective estrogen receptor modulators (SERMs): Secondary | ICD-10-CM | POA: Diagnosis not present

## 2022-11-19 DIAGNOSIS — M7989 Other specified soft tissue disorders: Secondary | ICD-10-CM | POA: Insufficient documentation

## 2022-11-19 DIAGNOSIS — Z170 Estrogen receptor positive status [ER+]: Secondary | ICD-10-CM | POA: Insufficient documentation

## 2022-11-19 DIAGNOSIS — Z91041 Radiographic dye allergy status: Secondary | ICD-10-CM | POA: Diagnosis not present

## 2022-11-19 DIAGNOSIS — Z79899 Other long term (current) drug therapy: Secondary | ICD-10-CM | POA: Diagnosis not present

## 2022-11-19 DIAGNOSIS — R5383 Other fatigue: Secondary | ICD-10-CM | POA: Insufficient documentation

## 2022-11-19 DIAGNOSIS — Z9011 Acquired absence of right breast and nipple: Secondary | ICD-10-CM | POA: Insufficient documentation

## 2022-11-19 DIAGNOSIS — R635 Abnormal weight gain: Secondary | ICD-10-CM | POA: Insufficient documentation

## 2022-11-19 DIAGNOSIS — Z888 Allergy status to other drugs, medicaments and biological substances status: Secondary | ICD-10-CM | POA: Diagnosis not present

## 2022-11-19 DIAGNOSIS — Z882 Allergy status to sulfonamides status: Secondary | ICD-10-CM | POA: Insufficient documentation

## 2022-11-19 DIAGNOSIS — C50411 Malignant neoplasm of upper-outer quadrant of right female breast: Secondary | ICD-10-CM | POA: Insufficient documentation

## 2022-11-19 DIAGNOSIS — R232 Flushing: Secondary | ICD-10-CM | POA: Insufficient documentation

## 2022-11-19 NOTE — Progress Notes (Signed)
Patient Care Team: Myrlene Broker, MD as PCP - General (Internal Medicine) Maxie Better, MD as Consulting Physician (Obstetrics and Gynecology) Ralene Cork, DO as Consulting Physician (Family Medicine) Iva Boop, MD (Gastroenterology) Abigail Miyamoto, MD as Consulting Physician (General Surgery) Antony Blackbird, MD as Consulting Physician (Radiation Oncology) Rodolph Bong, MD as Consulting Physician (Family Medicine) Serena Croissant, MD as Consulting Physician (Hematology and Oncology) Pickenpack-Cousar, Arty Baumgartner, NP as Nurse Practitioner (Hospice and Palliative Medicine) Glendale Chard, DO as Consulting Physician (Neurology)  DIAGNOSIS:  Encounter Diagnosis  Name Primary?   Malignant neoplasm of upper-outer quadrant of right breast in female, estrogen receptor positive (HCC) Yes    SUMMARY OF ONCOLOGIC HISTORY: Oncology History  Malignant neoplasm of upper-outer quadrant of right breast in female, estrogen receptor positive (HCC)  11/06/2020 Mammogram   Exam: 3D Mammogram Diagnostic - Right Breast Ultrasound - Right  IMPRESSION: The 0.8 cm irregular mass in the right breast at 9 o'clock is highly suggestive of malignancy. Ultrasound of the right axilla is negative for lymphadenopathy.   11/08/2020 Initial Biopsy   Diagnosis Breast, right, needle core biopsy, 9 o'clock 10cm fn posterior depth - INVASIVE DUCTAL CARCINOMA, GRADE 1/2. - DUCTAL CARCINOMA IN SITU. - SEE MICROSCOPIC DESCRIPTION.  Microscopic Comment The greatest tumor dimension is 0.5 cm.  PROGNOSTIC INDICATORS Results: The tumor cells are NEGATIVE for Her2 (0). Estrogen Receptor: 100%, POSITIVE, STRONG STAINING INTENSITY Progesterone Receptor: 100%, POSITIVE, STRONG STAINING INTENSITY Proliferation Marker Ki67: 1%   11/13/2020 Initial Diagnosis   Malignant neoplasm of upper-outer quadrant of right breast in female, estrogen receptor positive (HCC)   11/15/2020 Cancer Staging    Staging form: Breast, AJCC 8th Edition - Clinical stage from 11/15/2020: Stage IA (cT1b, cN0, cM0, G1, ER+, PR+, HER2-) - Signed by Lowella Dell, MD on 11/15/2020 Stage prefix: Initial diagnosis Histologic grading system: 3 grade system   12/13/2020 Cancer Staging   Staging form: Breast, AJCC 8th Edition - Pathologic stage from 12/13/2020: Stage IA (pT1b, pN0, cM0, G1, ER+, PR+, HER2-) - Signed by Loa Socks, NP on 03/20/2021 Stage prefix: Initial diagnosis Histologic grading system: 3 grade system   12/13/2020 Definitive Surgery   FINAL MICROSCOPIC DIAGNOSIS:   A. BREAST, RIGHT, MASTECTOMY:  - Invasive and in situ ductal carcinoma, 0.9 cm.  - Margins not involved.  - Biopsy site and biopsy clip.  - See oncology table.   B. LYMPH NODE, RIGHT AXILLARY #1, SENTINEL, EXCISION:  - One lymph node negative for metastatic carcinoma (0/1).   C. LYMPH NODE, RIGHT AXILLARY #2, SENTINEL, EXCISION:  - One lymph node negative for metastatic carcinoma (0/1).   D. LYMPH NODE, RIGHT AXILLARY, SENTINEL, EXCISION:  - One lymph node negative for metastatic carcinoma (0/1).   E. LYMPH NODE, RIGHT AXILLARY, SENTINEL, EXCISION:  - One lymph node negative for metastatic carcinoma (0/1).   F. LYMPH NODE, RIGHT AXILLARY, SENTINEL, EXCISION:  - One lymph node negative for metastatic carcinoma (0/1).    12/13/2020 Oncotype testing   Oncotype DX was obtained on the final surgical sample and the recurrence score of 8 predicts a risk of recurrence outside the breast over the next 9 years of 3%, if the patient's only systemic therapy is an antiestrogen for 5 years.  It also predicts a <1% benefit from chemotherapy.   01/2021 -  Anti-estrogen oral therapy   Tamoxifen daily     CHIEF COMPLIANT: Follow-up tamoxifen  INTERVAL HISTORY: Donna Robbins is a 52 y.o. with  the above mentioned  Estrogen receptor positive breast cancer (s/p right mastectomy).  She presents to the clinic for a  follow-up. Patient reports that she still feel tired. She states that the swelling in legs has got better. Tamoxifen is causing her to wake up in the night. She says she is not sleeping well. She is also aware of a fast heart rate. She does have weight gain.   ALLERGIES:  is allergic to iodine and sulfonamide derivatives.  MEDICATIONS:  Current Outpatient Medications  Medication Sig Dispense Refill   acetaminophen (TYLENOL) 500 MG tablet Take 1,000 mg by mouth 2 (two) times daily as needed for mild pain (joint/muscle pain.).     clindamycin (CLEOCIN T) 1 % external solution Apply 1 mL on the skin twice a day 60 mL 2   clonazePAM (KLONOPIN) 0.5 MG tablet Take 1/2-1 tablet (0.25-0.5 mg total) by mouth daily as needed for anxiety 30 tablet 0   gabapentin (NEURONTIN) 100 MG capsule Take 1 capsule (100 mg total) by mouth every morning. 30 capsule 3   Multiple Vitamin (MULTIVITAMIN WITH MINERALS) TABS tablet Take 1 tablet by mouth in the morning.     tamoxifen (NOLVADEX) 10 MG tablet Take 1 tablet by mouth daily. 90 tablet 3   traZODone (DESYREL) 50 MG tablet Take by mouth.     vitamin B-12 (CYANOCOBALAMIN) 1000 MCG tablet Take 1,000 mcg by mouth in the morning.     gabapentin (NEURONTIN) 300 MG capsule Take 1 capsule by mouth 2 times daily. 60 capsule 3   nitroGLYCERIN (NITRODUR - DOSED IN MG/24 HR) 0.2 mg/hr patch APPLY 1/4 PATCH DAILY TO TENDON FOR TENDONITIS. 30 patch 1   predniSONE (DELTASONE) 10 MG tablet Days 1-4 take 4 tablets  daily,  Days 5-8 take 3 tablets daily, Days 9-11 take 2 tablets  daily, Days 12-14 take 1 tablet daily. 37 tablet 0   No current facility-administered medications for this visit.    PHYSICAL EXAMINATION: ECOG PERFORMANCE STATUS: 1 - Symptomatic but completely ambulatory  Vitals:   11/19/22 1154  BP: 128/85  Pulse: 97  Resp: 18  Temp: (!) 97.5 F (36.4 C)  SpO2: 98%   Filed Weights   11/19/22 1154  Weight: 204 lb 4.8 oz (92.7 kg)    BREAST: No palpable  masses or nodules in either right or left breasts. No palpable axillary supraclavicular or infraclavicular adenopathy no breast tenderness or nipple discharge. (exam performed in the presence of a chaperone)  LABORATORY DATA:  I have reviewed the data as listed    Latest Ref Rng & Units 04/17/2022    9:29 AM 01/08/2021    3:33 PM 11/15/2020   12:28 PM  CMP  Glucose 70 - 99 mg/dL 91  86  78   BUN 6 - 23 mg/dL 12  7  10    Creatinine 0.40 - 1.20 mg/dL 1.61  0.96  0.45   Sodium 135 - 145 mEq/L 143  140  140   Potassium 3.5 - 5.1 mEq/L 4.7  4.1  4.1   Chloride 96 - 112 mEq/L 105  104  103   CO2 19 - 32 mEq/L 31  26  28    Calcium 8.4 - 10.5 mg/dL 9.2  9.0  9.7   Total Protein 6.0 - 8.3 g/dL 7.4  6.9  8.0   Total Bilirubin 0.2 - 1.2 mg/dL 0.4  0.3  0.4   Alkaline Phos 39 - 117 U/L 54  52  62   AST  0 - 37 U/L 17  17  22    ALT 0 - 35 U/L 13  15  21      Lab Results  Component Value Date   WBC 6.0 04/17/2022   HGB 13.0 04/17/2022   HCT 38.8 04/17/2022   MCV 95.0 04/17/2022   PLT 318.0 04/17/2022   NEUTROABS 4.9 01/08/2021    ASSESSMENT & PLAN:  Malignant neoplasm of upper-outer quadrant of right breast in female, estrogen receptor positive (HCC) 11/08/2020: Clinical T1BN0 stage Ia IDC grade 2, ER/PR positive HER2 negative Ki-67 1% 12/13/2021: Right mastectomy: Grade 1 IDC margins -0/5 lymph nodes negative, Oncotype score 8: 3% ROR Current treatment: Tamoxifen started 01/23/2021 discontinued and switched to Zoladex with anastrozole (due to swelling of extremities) Anastrozole/Zoladex toxicities: Tolerating it better with mild to moderate hot flashes and mild to moderate joint stiffness Because of expense related to Zoladex, we switched back to tamoxifen.   Tamoxifen Toxicities: Tolerating 10 mg dose Lot less swelling of legs and face Hot flashes have improved   Neuropathic symptoms: in arms. Causing difficulties towards the end of the day. Took Gabapentin 100 mg but its not being  effective.  Therefore she discontinued it.     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  By the middle to end of the day she is getting a lot of symptoms in the right arm and it is making her miserable and slightly angry which bothers her.   Breast cancer surveillance: 1.  Breast exam 07/09/2022: Benign 2. mammograms [facility]/14/2022 at [facility]: Benign breast density category B   FSH: 9, Estradiol 74 on 11/14/22 Recommended obtaining FSH and estradiol levels in 1 year with her GYN to determine if she is menopausal but we can switch her to anastrozole therapy.   Return to clinic in 1 year for follow-up   No orders of the defined types were placed in this encounter.  The patient has a good understanding of the overall plan. she agrees with it. she will call with any problems that may develop before the next visit here. Total time spent: 30 mins including face to face time and time spent for planning, charting and co-ordination of care   Tamsen Meek, MD 11/19/22    I Janan Ridge am acting as a Neurosurgeon for [employer]  I have reviewed the above documentation for accuracy and completeness, and I agree with the above.

## 2022-11-19 NOTE — Assessment & Plan Note (Addendum)
11/08/2020: Clinical T1BN0 stage Ia IDC grade 2, ER/PR positive HER2 negative Ki-67 1% 12/13/2021: Right mastectomy: Grade 1 IDC margins -0/5 lymph nodes negative, Oncotype score 8: 3% ROR Current treatment: Tamoxifen started 01/23/2021 discontinued and switched to Zoladex with anastrozole (due to swelling of extremities) Anastrozole/Zoladex toxicities: Tolerating it better with mild to moderate hot flashes and mild to moderate joint stiffness Because of expense related to Zoladex, we switched back to tamoxifen.   Tamoxifen Toxicities: Tolerating 10 mg dose Lot less swelling of legs and face Hot flashes have improved   Neuropathic symptoms: in arms. Causing difficulties towards the end of the day. Took Gabapentin 100 mg but its not being effective. Trying to walk and get exercise.   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  By the middle to end of the day she is getting a lot of symptoms in the right arm and it is making her miserable and slightly angry which bothers her.   Breast cancer surveillance: 1.  Breast exam 07/09/2022: Benign 2. mammograms 11/14/2022 at [facility]: Benign breast density category B   FSH: 9, Estradiol 74 on 11/14/22 Recommended obtaining FSH and estradiol levels to determine if she is menopausal but we can switch her to anastrozole therapy. She will try to do this blood work with her gynecologist when she meets with them.

## 2022-12-19 ENCOUNTER — Other Ambulatory Visit (HOSPITAL_COMMUNITY): Payer: Self-pay

## 2022-12-19 MED ORDER — CLINDAMYCIN PHOSPHATE 1 % EX SOLN
CUTANEOUS | 2 refills | Status: DC
  Filled 2022-12-19: qty 60, 30d supply, fill #0
  Filled 2023-03-21: qty 60, 30d supply, fill #1

## 2022-12-23 ENCOUNTER — Other Ambulatory Visit (HOSPITAL_COMMUNITY): Payer: Self-pay

## 2023-02-02 IMAGING — CT CT HEAD W/O CM
1 series · 16 of 30 positions shown, 20 images · non-contrast
Comparison: Head MRI 05/23/2010

CLINICAL DATA: Neuro deficit, acute, stroke suspected. Right-sided
facial numbness, tingling, and dizziness since a mastectomy in
[DATE].

EXAM:
CT HEAD WITHOUT CONTRAST
TECHNIQUE: Contiguous axial images were obtained from the base of the skull
through the vertex without intravenous contrast.

[Series 2: head w/(date) · axial · 0.49mm/px · z∈[+13,+173]mm · 16 of 36 slices shown, 20 images]
[im 2/36  brain]
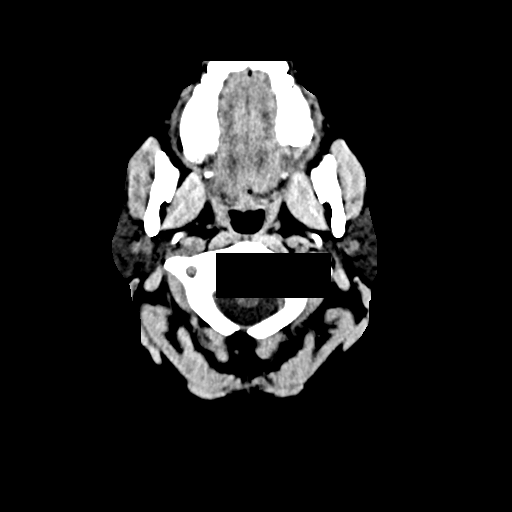
[im 2/36  bone]
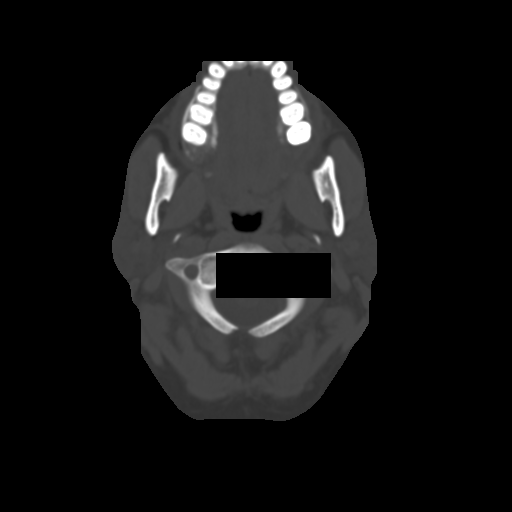
[im 4/36  brain]
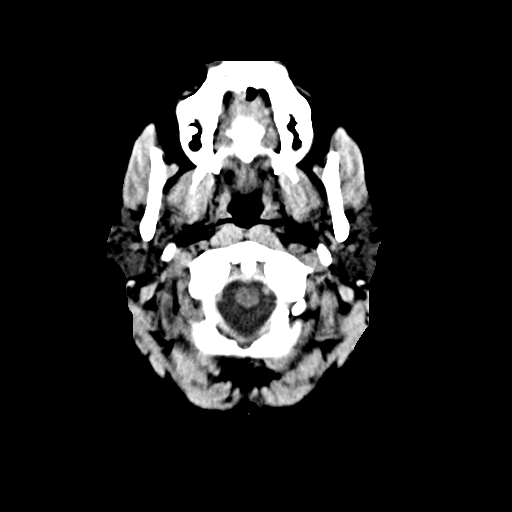
[im 7/36  brain]
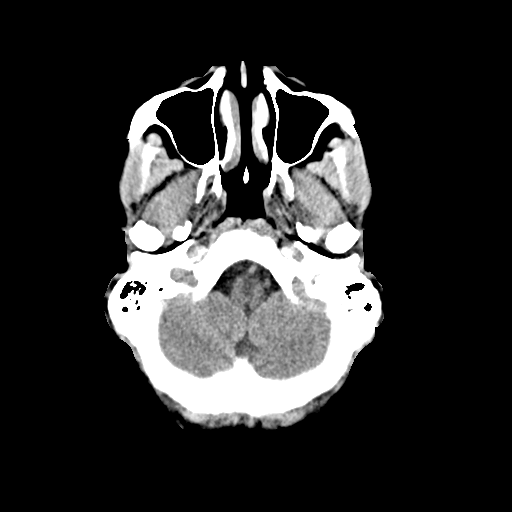
[im 9/36  brain]
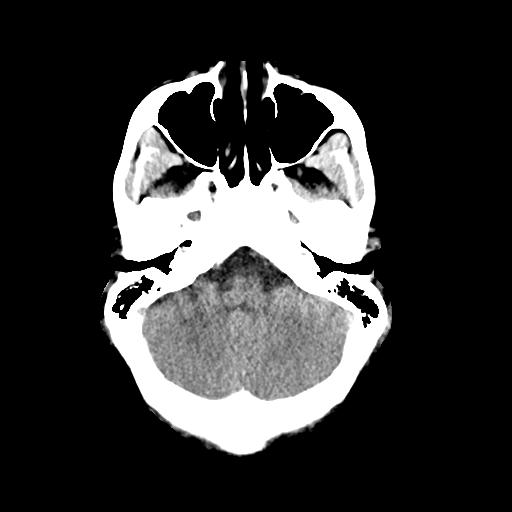
[im 10/36  brain]
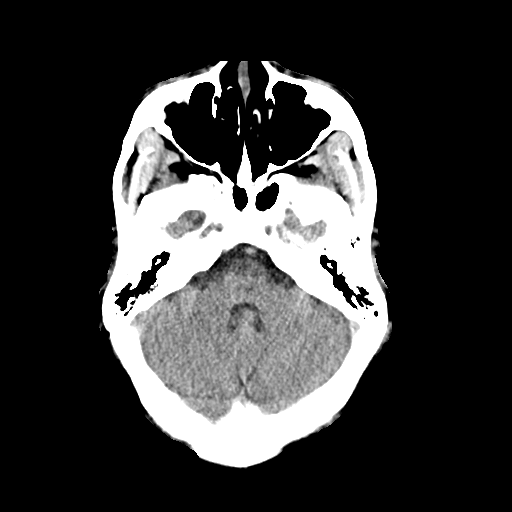
[im 10/36  bone]
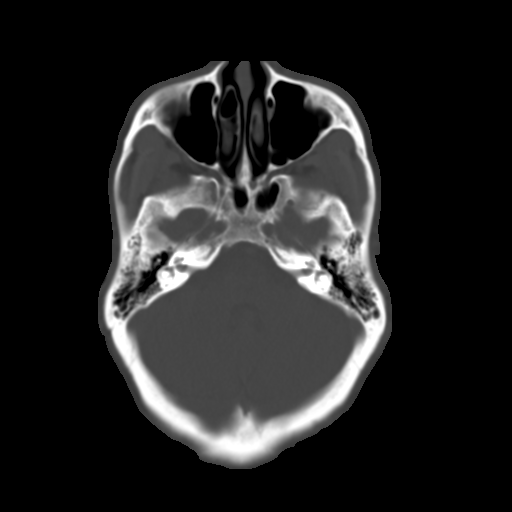
[im 13/36  brain]
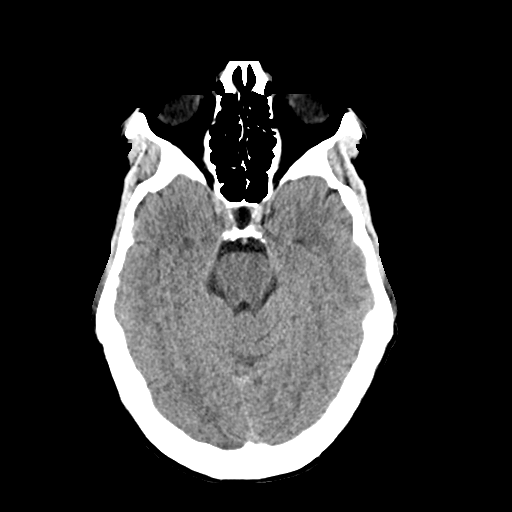
[im 15/36  brain]
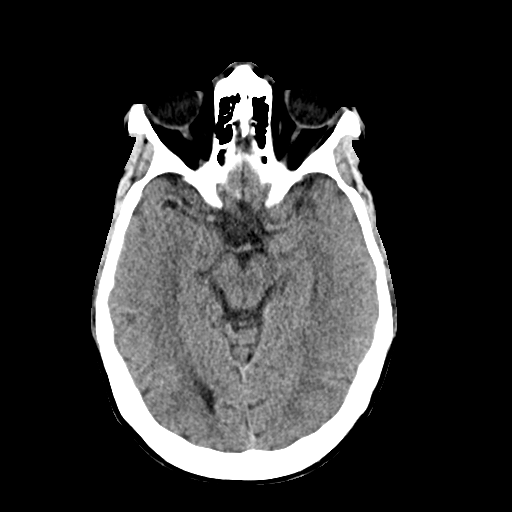
[im 17/36  brain]
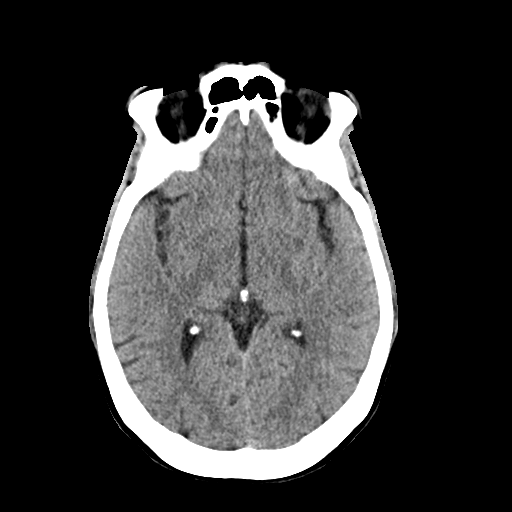
[im 19/36  brain]
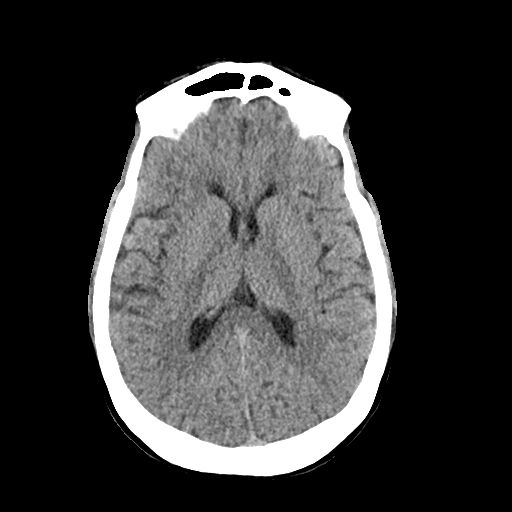
[im 19/36  bone]
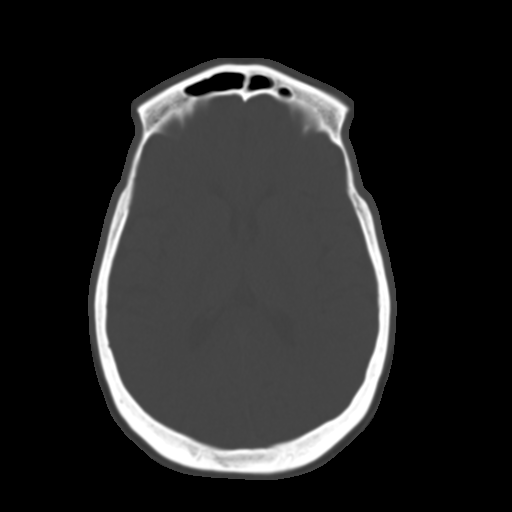
[im 21/36  brain]
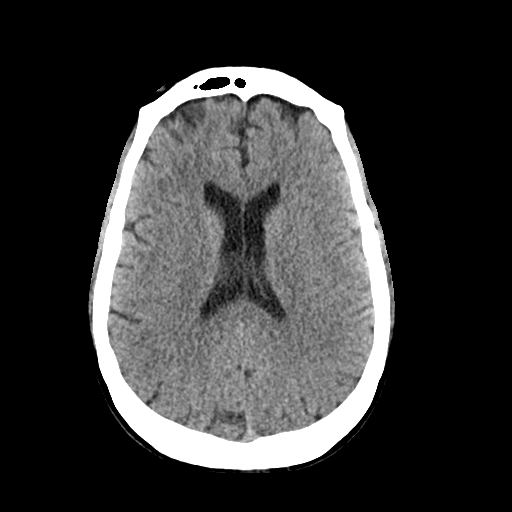
[im 23/36  brain]
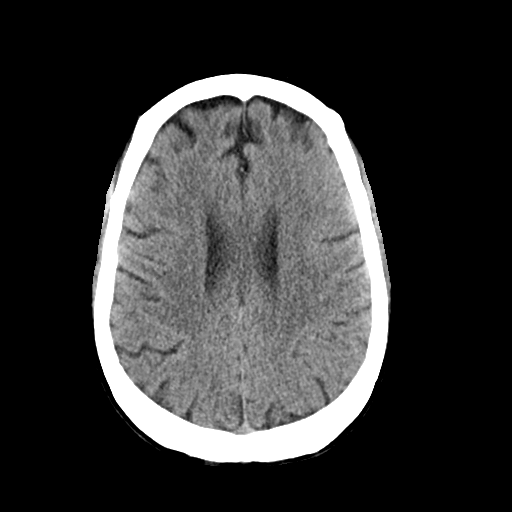
[im 26/36  brain]
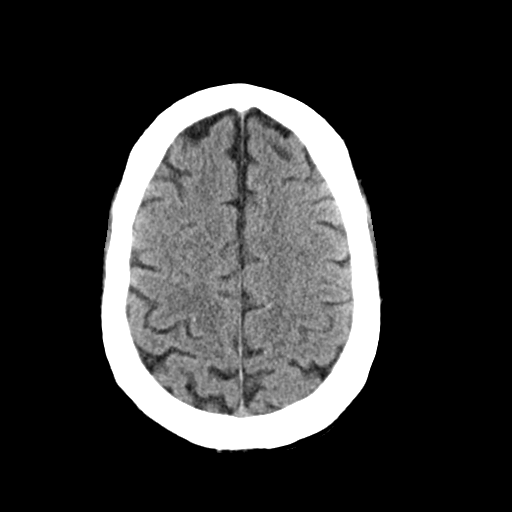
[im 27/36  brain]
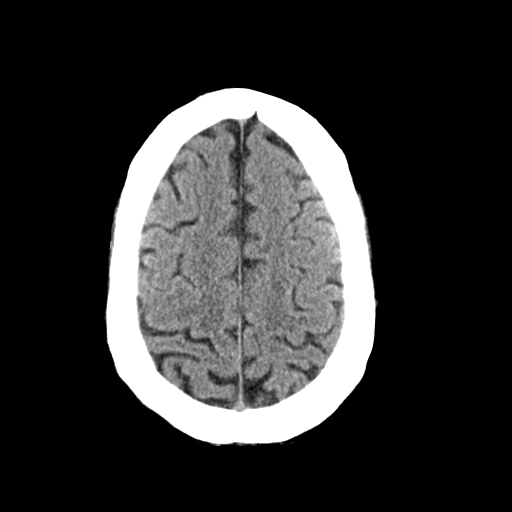
[im 27/36  bone]
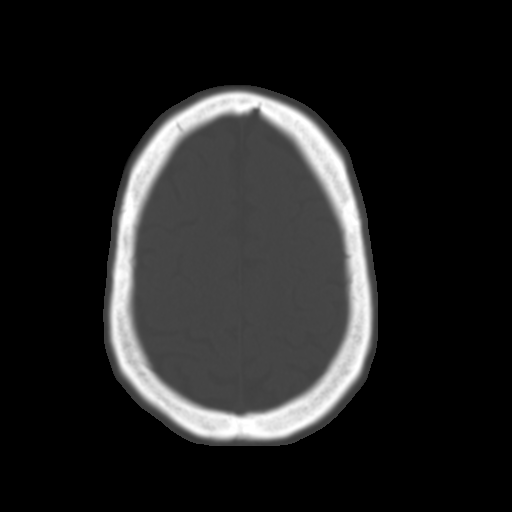
[im 29/36  brain]
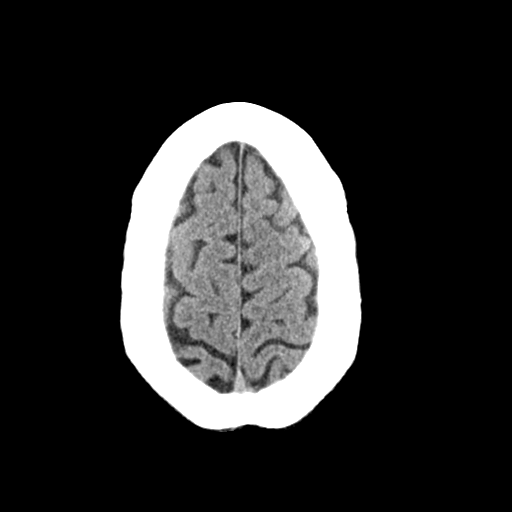
[im 32/36  brain]
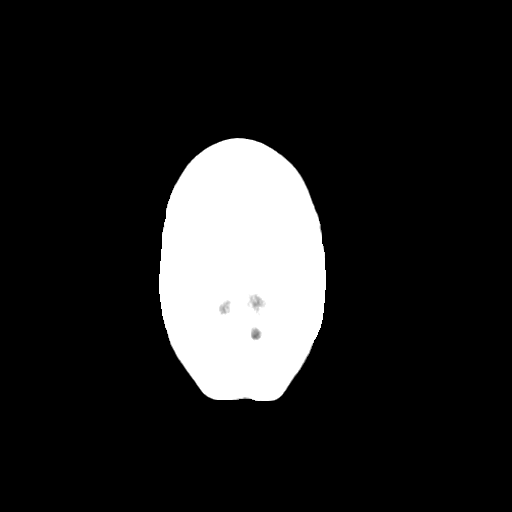
[im 34/36  brain]
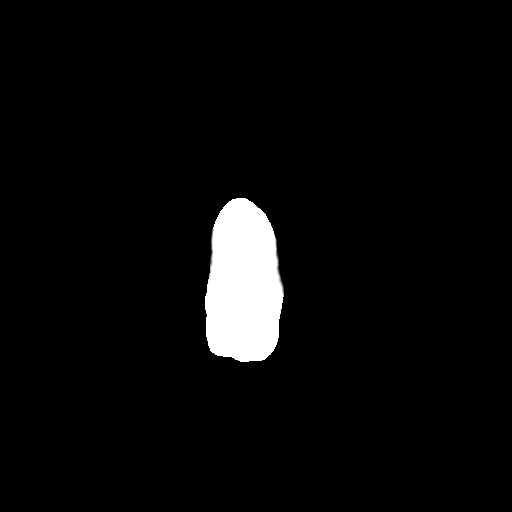

[16 of 30 positions shown; findings below may reference images not displayed]

FINDINGS: Brain: There is no evidence of an acute infarct, intracranial
hemorrhage, mass, midline shift, or extra-axial fluid collection.
The ventricles and sulci are normal.

Vascular: No hyperdense vessel.

Skull: No fracture or suspicious osseous lesion.

Sinuses/Orbits: Paranasal sinuses and mastoid air cells are clear.
Unremarkable orbits.

Other: None.
IMPRESSION: Negative head CT.

## 2023-03-04 ENCOUNTER — Other Ambulatory Visit (HOSPITAL_COMMUNITY): Payer: Self-pay

## 2023-03-04 ENCOUNTER — Other Ambulatory Visit: Payer: Self-pay

## 2023-03-10 DIAGNOSIS — H40013 Open angle with borderline findings, low risk, bilateral: Secondary | ICD-10-CM | POA: Diagnosis not present

## 2023-03-10 DIAGNOSIS — H524 Presbyopia: Secondary | ICD-10-CM | POA: Diagnosis not present

## 2023-04-17 ENCOUNTER — Encounter: Payer: Self-pay | Admitting: Internal Medicine

## 2023-04-17 NOTE — Telephone Encounter (Signed)
Please advise 

## 2023-04-25 ENCOUNTER — Other Ambulatory Visit (HOSPITAL_COMMUNITY): Payer: Self-pay

## 2023-04-25 ENCOUNTER — Encounter: Payer: Self-pay | Admitting: Internal Medicine

## 2023-04-25 ENCOUNTER — Ambulatory Visit (INDEPENDENT_AMBULATORY_CARE_PROVIDER_SITE_OTHER): Payer: 59 | Admitting: Internal Medicine

## 2023-04-25 VITALS — BP 118/88 | HR 85 | Temp 98.4°F | Ht 63.0 in | Wt 208.0 lb

## 2023-04-25 DIAGNOSIS — Z170 Estrogen receptor positive status [ER+]: Secondary | ICD-10-CM | POA: Diagnosis not present

## 2023-04-25 DIAGNOSIS — F419 Anxiety disorder, unspecified: Secondary | ICD-10-CM | POA: Diagnosis not present

## 2023-04-25 DIAGNOSIS — C50911 Malignant neoplasm of unspecified site of right female breast: Secondary | ICD-10-CM

## 2023-04-25 DIAGNOSIS — Z0000 Encounter for general adult medical examination without abnormal findings: Secondary | ICD-10-CM

## 2023-04-25 DIAGNOSIS — Z860101 Personal history of adenomatous and serrated colon polyps: Secondary | ICD-10-CM

## 2023-04-25 DIAGNOSIS — G8929 Other chronic pain: Secondary | ICD-10-CM

## 2023-04-25 DIAGNOSIS — M25562 Pain in left knee: Secondary | ICD-10-CM | POA: Diagnosis not present

## 2023-04-25 LAB — CBC
HCT: 39.5 % (ref 36.0–46.0)
Hemoglobin: 13.1 g/dL (ref 12.0–15.0)
MCHC: 33.1 g/dL (ref 30.0–36.0)
MCV: 95.9 fL (ref 78.0–100.0)
Platelets: 311 10*3/uL (ref 150.0–400.0)
RBC: 4.12 Mil/uL (ref 3.87–5.11)
RDW: 13.3 % (ref 11.5–15.5)
WBC: 6 10*3/uL (ref 4.0–10.5)

## 2023-04-25 LAB — HEMOGLOBIN A1C: Hgb A1c MFr Bld: 5.8 % (ref 4.6–6.5)

## 2023-04-25 LAB — COMPREHENSIVE METABOLIC PANEL
ALT: 13 U/L (ref 0–35)
AST: 18 U/L (ref 0–37)
Albumin: 4.2 g/dL (ref 3.5–5.2)
Alkaline Phosphatase: 62 U/L (ref 39–117)
BUN: 13 mg/dL (ref 6–23)
CO2: 29 meq/L (ref 19–32)
Calcium: 9.1 mg/dL (ref 8.4–10.5)
Chloride: 104 meq/L (ref 96–112)
Creatinine, Ser: 0.61 mg/dL (ref 0.40–1.20)
GFR: 101.93 mL/min (ref >60.00)
Glucose, Bld: 89 mg/dL (ref 70–99)
Potassium: 3.9 meq/L (ref 3.5–5.1)
Sodium: 140 meq/L (ref 135–145)
Total Bilirubin: 0.4 mg/dL (ref 0.2–1.2)
Total Protein: 7.1 g/dL (ref 6.0–8.3)

## 2023-04-25 LAB — LIPID PANEL
Cholesterol: 140 mg/dL (ref 0–200)
HDL: 48.7 mg/dL (ref >39.00)
LDL Cholesterol: 68 mg/dL (ref 0–99)
NonHDL: 91.04
Total CHOL/HDL Ratio: 3
Triglycerides: 114 mg/dL (ref 0.0–149.0)
VLDL: 22.8 mg/dL (ref 0.0–40.0)

## 2023-04-25 LAB — TSH: TSH: 2.16 u[IU]/mL (ref 0.35–5.50)

## 2023-04-25 LAB — VITAMIN B12: Vitamin B-12: 152 pg/mL — ABNORMAL LOW (ref 211–911)

## 2023-04-25 LAB — VITAMIN D 25 HYDROXY (VIT D DEFICIENCY, FRACTURES): VITD: 20.92 ng/mL — ABNORMAL LOW (ref 30.00–100.00)

## 2023-04-25 MED ORDER — CLONAZEPAM 0.5 MG PO TABS
0.2500 mg | ORAL_TABLET | Freq: Every day | ORAL | 0 refills | Status: DC
  Filled 2023-04-25: qty 30, 30d supply, fill #0

## 2023-04-25 NOTE — Assessment & Plan Note (Signed)
Slightly worse and would like to see sports medicine. If referral needed we can do.

## 2023-04-25 NOTE — Assessment & Plan Note (Signed)
Flu shot up to date. Shingrix complete. Tetanus due 2025. Colonoscopy due 2025. Mammogram yearly, pap smear up to date with gyn. Counseled about sun safety and mole surveillance. Counseled about the dangers of distracted driving. Given 10 year screening recommendations.

## 2023-04-25 NOTE — Assessment & Plan Note (Signed)
Some worse due to work stress. Refilled clonazepam and she is using more regularly. We discussed the risk and benefit of considering a long acting agent in addition. She will hold off for now (work stress likely to improve in next few months).

## 2023-04-25 NOTE — Assessment & Plan Note (Signed)
Due for colonoscopy this year referral done.

## 2023-04-25 NOTE — Progress Notes (Signed)
   Subjective:   Patient ID: Donna Robbins, female    DOB: 1970-05-22, 53 y.o.   MRN: [id number]  HPI The patient is here for physical. One episode fast HR to 200 for 2 hours  PMH, [facility], social history reviewed and updated  Review of Systems  Constitutional: Negative.   HENT: Negative.    Eyes: Negative.   Respiratory:  Negative for cough, chest tightness and shortness of breath.   Cardiovascular:  Negative for chest pain, palpitations and leg swelling.  Gastrointestinal:  Negative for abdominal distention, abdominal pain, constipation, diarrhea, nausea and vomiting.  Musculoskeletal: Negative.   Skin: Negative.   Neurological: Negative.   Psychiatric/Behavioral: Negative.      Objective:  Physical Exam Constitutional:      Appearance: She is well-developed.  HENT:     Head: Normocephalic and atraumatic.  Cardiovascular:     Rate and Rhythm: Normal rate and regular rhythm.  Pulmonary:     Effort: Pulmonary effort is normal. No respiratory distress.     Breath sounds: Normal breath sounds. No wheezing or rales.  Abdominal:     General: Bowel sounds are normal. There is no distension.     Palpations: Abdomen is soft.     Tenderness: There is no abdominal tenderness. There is no rebound.  Musculoskeletal:     Cervical back: Normal range of motion.  Skin:    General: Skin is warm and dry.  Neurological:     Mental Status: She is alert and oriented to person, place, and time.     Coordination: Coordination normal.     Vitals:   04/25/23 0744  BP: 118/88  Pulse: 85  Temp: 98.4 F (36.9 C)  TempSrc: Oral  SpO2: 98%  Weight: 208 lb (94.3 kg)  Height: 5\' 3"  (1.6 m)    Assessment & Plan:

## 2023-04-25 NOTE — Assessment & Plan Note (Signed)
Still taking tamoxifen and up to date on mammogram.

## 2023-04-25 NOTE — Patient Instructions (Signed)
Let us know about the heart rate if this happens again.

## 2023-04-29 DIAGNOSIS — D2272 Melanocytic nevi of left lower limb, including hip: Secondary | ICD-10-CM | POA: Diagnosis not present

## 2023-04-29 DIAGNOSIS — D2262 Melanocytic nevi of left upper limb, including shoulder: Secondary | ICD-10-CM | POA: Diagnosis not present

## 2023-04-29 DIAGNOSIS — D2261 Melanocytic nevi of right upper limb, including shoulder: Secondary | ICD-10-CM | POA: Diagnosis not present

## 2023-04-29 DIAGNOSIS — L603 Nail dystrophy: Secondary | ICD-10-CM | POA: Diagnosis not present

## 2023-04-29 DIAGNOSIS — D2271 Melanocytic nevi of right lower limb, including hip: Secondary | ICD-10-CM | POA: Diagnosis not present

## 2023-04-29 DIAGNOSIS — L812 Freckles: Secondary | ICD-10-CM | POA: Diagnosis not present

## 2023-04-29 DIAGNOSIS — L821 Other seborrheic keratosis: Secondary | ICD-10-CM | POA: Diagnosis not present

## 2023-04-29 DIAGNOSIS — D225 Melanocytic nevi of trunk: Secondary | ICD-10-CM | POA: Diagnosis not present

## 2023-04-29 DIAGNOSIS — D1801 Hemangioma of skin and subcutaneous tissue: Secondary | ICD-10-CM | POA: Diagnosis not present

## 2023-09-14 ENCOUNTER — Other Ambulatory Visit: Payer: Self-pay | Admitting: Internal Medicine

## 2023-09-14 ENCOUNTER — Other Ambulatory Visit: Payer: Self-pay | Admitting: Hematology and Oncology

## 2023-09-15 ENCOUNTER — Other Ambulatory Visit: Payer: Self-pay

## 2023-09-15 ENCOUNTER — Other Ambulatory Visit (HOSPITAL_COMMUNITY): Payer: Self-pay

## 2023-09-15 MED ORDER — CLONAZEPAM 0.5 MG PO TABS
0.2500 mg | ORAL_TABLET | Freq: Every day | ORAL | 0 refills | Status: DC
  Filled 2023-09-15: qty 30, 30d supply, fill #0

## 2023-09-15 MED ORDER — TAMOXIFEN CITRATE 10 MG PO TABS
10.0000 mg | ORAL_TABLET | Freq: Every day | ORAL | 3 refills | Status: AC
  Filled 2023-09-15: qty 90, 90d supply, fill #0
  Filled 2023-12-13: qty 90, 90d supply, fill #1
  Filled 2024-03-22: qty 90, 90d supply, fill #2

## 2023-09-16 ENCOUNTER — Other Ambulatory Visit: Payer: Self-pay

## 2023-10-02 ENCOUNTER — Encounter: Payer: Self-pay | Admitting: Internal Medicine

## 2023-10-02 ENCOUNTER — Encounter: Payer: Self-pay | Admitting: Hematology and Oncology

## 2023-10-02 DIAGNOSIS — E559 Vitamin D deficiency, unspecified: Secondary | ICD-10-CM

## 2023-10-02 DIAGNOSIS — E538 Deficiency of other specified B group vitamins: Secondary | ICD-10-CM

## 2023-10-03 DIAGNOSIS — E559 Vitamin D deficiency, unspecified: Secondary | ICD-10-CM | POA: Insufficient documentation

## 2023-10-03 DIAGNOSIS — E538 Deficiency of other specified B group vitamins: Secondary | ICD-10-CM | POA: Insufficient documentation

## 2023-10-08 NOTE — Progress Notes (Unsigned)
    [facility] [address] [place] [address] Phone: [phone]   Assessment and Plan:     There are no diagnoses linked to this encounter.  ***   Pertinent previous records reviewed include ***    Follow Up: ***     Subjective:   I, Donna Robbins, am serving as a Neurosurgeon for Doctor Donna Robbins  Chief Complaint: left knee pain   HPI:   10/09/2023 Patient is a 53 year old female with left knee pain. Patient states   Relevant Historical Information: ***  Additional pertinent review of systems negative.   Current Outpatient Medications:    clonazePAM  (KLONOPIN ) 0.5 MG tablet, Take 1/2-1 tablet (0.25-0.5 mg total) by mouth daily as needed for anxiety, Disp: 30 tablet, Rfl: 0   nitroGLYCERIN  (NITRODUR - DOSED IN MG/24 HR) 0.2 mg/hr patch, APPLY 1/4 PATCH DAILY TO TENDON FOR TENDONITIS., Disp: 30 patch, Rfl: 1   tamoxifen  (NOLVADEX ) 10 MG tablet, Take 1 tablet by mouth daily., Disp: 90 tablet, Rfl: 3   vitamin B-12 (CYANOCOBALAMIN ) 1000 MCG tablet, Take 1,000 mcg by mouth in the morning., Disp: , Rfl:    Objective:     There were no vitals filed for this visit.    There is no height or weight on file to calculate BMI.    Physical Exam:    ***   Electronically signed by:  Donna Robbins [facility] 7:38 AM 10/08/23

## 2023-10-09 ENCOUNTER — Other Ambulatory Visit (HOSPITAL_COMMUNITY): Payer: Self-pay

## 2023-10-09 ENCOUNTER — Ambulatory Visit

## 2023-10-09 ENCOUNTER — Ambulatory Visit (INDEPENDENT_AMBULATORY_CARE_PROVIDER_SITE_OTHER): Admitting: Sports Medicine

## 2023-10-09 ENCOUNTER — Other Ambulatory Visit

## 2023-10-09 VITALS — BP 124/76 | HR 101 | Ht 63.0 in | Wt 208.0 lb

## 2023-10-09 DIAGNOSIS — E559 Vitamin D deficiency, unspecified: Secondary | ICD-10-CM

## 2023-10-09 DIAGNOSIS — M25562 Pain in left knee: Secondary | ICD-10-CM

## 2023-10-09 DIAGNOSIS — G8929 Other chronic pain: Secondary | ICD-10-CM

## 2023-10-09 DIAGNOSIS — E538 Deficiency of other specified B group vitamins: Secondary | ICD-10-CM

## 2023-10-09 DIAGNOSIS — M25462 Effusion, left knee: Secondary | ICD-10-CM | POA: Diagnosis not present

## 2023-10-09 LAB — VITAMIN B12: Vitamin B-12: 669 pg/mL (ref 211–911)

## 2023-10-09 LAB — VITAMIN D 25 HYDROXY (VIT D DEFICIENCY, FRACTURES): VITD: 29.6 ng/mL — ABNORMAL LOW (ref 30.00–100.00)

## 2023-10-09 MED ORDER — MELOXICAM 15 MG PO TABS
15.0000 mg | ORAL_TABLET | Freq: Every day | ORAL | 0 refills | Status: DC
  Filled 2023-10-09: qty 30, 30d supply, fill #0

## 2023-10-09 NOTE — Patient Instructions (Signed)
 Knee HEP  - Start meloxicam  15 mg daily x2 weeks.  If still having pain after 2 weeks, complete 3rd-week of NSAID. May use remaining NSAID as needed once daily for pain control.  Do not to use additional over-the-counter NSAIDs (ibuprofen, naproxen, Advil, Aleve, etc.) while taking prescription NSAIDs.  May use Tylenol  [phone] mg 2 to 3 times a day for breakthrough pain. 4-5 week follow up

## 2023-10-13 ENCOUNTER — Other Ambulatory Visit (HOSPITAL_COMMUNITY): Payer: Self-pay

## 2023-10-13 ENCOUNTER — Ambulatory Visit (AMBULATORY_SURGERY_CENTER)

## 2023-10-13 ENCOUNTER — Encounter: Payer: Self-pay | Admitting: Internal Medicine

## 2023-10-13 VITALS — Ht 63.0 in | Wt 210.0 lb

## 2023-10-13 DIAGNOSIS — Z860100 Personal history of colon polyps, unspecified: Secondary | ICD-10-CM

## 2023-10-13 MED ORDER — NA SULFATE-K SULFATE-MG SULF 17.5-3.13-1.6 GM/177ML PO SOLN
1.0000 | Freq: Once | ORAL | 0 refills | Status: AC
  Filled 2023-10-13 – 2023-11-06 (×2): qty 354, 1d supply, fill #0

## 2023-10-13 NOTE — Progress Notes (Signed)

## 2023-10-14 ENCOUNTER — Ambulatory Visit: Payer: Self-pay | Admitting: Sports Medicine

## 2023-10-20 ENCOUNTER — Ambulatory Visit: Payer: Self-pay | Admitting: Internal Medicine

## 2023-10-22 ENCOUNTER — Telehealth: Payer: Self-pay | Admitting: Internal Medicine

## 2023-10-22 ENCOUNTER — Other Ambulatory Visit (HOSPITAL_COMMUNITY): Payer: Self-pay

## 2023-10-22 NOTE — Telephone Encounter (Signed)
 Inbound call from patient stating that she needed to reschedule her colonoscopy that was scheduled for 8/5 at 10:30 with Dr. Avram due to transportation issues. Patient was rescheduled for 9/11 at 9:30 and is needing new prep instructions. Please advise.

## 2023-10-22 NOTE — Telephone Encounter (Signed)
 Returned patient call, new instructions created and sent through MyChart

## 2023-10-28 ENCOUNTER — Encounter: Admitting: Internal Medicine
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
## 2023-11-06 ENCOUNTER — Other Ambulatory Visit (HOSPITAL_COMMUNITY): Payer: Self-pay

## 2023-11-06 ENCOUNTER — Ambulatory Visit (INDEPENDENT_AMBULATORY_CARE_PROVIDER_SITE_OTHER): Admitting: Sports Medicine

## 2023-11-06 VITALS — HR 97 | Ht 63.0 in | Wt 206.0 lb

## 2023-11-06 DIAGNOSIS — G8929 Other chronic pain: Secondary | ICD-10-CM | POA: Diagnosis not present

## 2023-11-06 DIAGNOSIS — M2550 Pain in unspecified joint: Secondary | ICD-10-CM | POA: Diagnosis not present

## 2023-11-06 DIAGNOSIS — M25562 Pain in left knee: Secondary | ICD-10-CM | POA: Diagnosis not present

## 2023-11-06 MED ORDER — MELOXICAM 15 MG PO TABS
15.0000 mg | ORAL_TABLET | Freq: Every day | ORAL | 2 refills | Status: AC | PRN
Start: 2023-11-06 — End: ?
  Filled 2023-11-06 (×2): qty 30, 30d supply, fill #0

## 2023-11-06 NOTE — Patient Instructions (Addendum)
 Tylenol  [phone] mg 2-3 times a day for pain relief   - Use meloxicam  15 mg daily as needed for pain.  Recommend limiting chronic NSAIDs to 1-2 doses per week to prevent long-term side effects.  Refilled meloxicam 

## 2023-11-07 ENCOUNTER — Other Ambulatory Visit (HOSPITAL_COMMUNITY): Payer: Self-pay

## 2023-11-09 ENCOUNTER — Other Ambulatory Visit: Payer: Self-pay | Admitting: Internal Medicine

## 2023-11-10 ENCOUNTER — Other Ambulatory Visit (HOSPITAL_COMMUNITY): Payer: Self-pay

## 2023-11-10 MED ORDER — CLONAZEPAM 0.5 MG PO TABS
0.2500 mg | ORAL_TABLET | Freq: Every day | ORAL | 0 refills | Status: DC
  Filled 2023-11-10: qty 30, 30d supply, fill #0

## 2023-11-11 DIAGNOSIS — C50911 Malignant neoplasm of unspecified site of right female breast: Secondary | ICD-10-CM | POA: Diagnosis not present

## 2023-11-20 ENCOUNTER — Inpatient Hospital Stay: Payer: 59 | Attending: Hematology and Oncology | Admitting: Hematology and Oncology

## 2023-11-20 VITALS — BP 122/78 | HR 87 | Temp 97.9°F | Resp 16 | Wt 203.8 lb

## 2023-11-20 DIAGNOSIS — Z170 Estrogen receptor positive status [ER+]: Secondary | ICD-10-CM | POA: Diagnosis not present

## 2023-11-20 DIAGNOSIS — Z1231 Encounter for screening mammogram for malignant neoplasm of breast: Secondary | ICD-10-CM | POA: Diagnosis not present

## 2023-11-20 DIAGNOSIS — C50911 Malignant neoplasm of unspecified site of right female breast: Secondary | ICD-10-CM | POA: Diagnosis not present

## 2023-11-20 DIAGNOSIS — C50411 Malignant neoplasm of upper-outer quadrant of right female breast: Secondary | ICD-10-CM | POA: Diagnosis not present

## 2023-11-20 DIAGNOSIS — C50811 Malignant neoplasm of overlapping sites of right female breast: Secondary | ICD-10-CM | POA: Diagnosis present

## 2023-11-20 LAB — HM MAMMOGRAPHY

## 2023-11-20 NOTE — Assessment & Plan Note (Signed)
 11/08/2020: Clinical T1BN0 stage Ia IDC grade 2, ER/PR positive HER2 negative Ki-67 1% 12/13/2021: Right mastectomy: Grade 1 IDC margins -0/5 lymph nodes negative, Oncotype score 8: 3% ROR Current treatment: Tamoxifen  started 01/23/2021 discontinued and switched to Zoladex  with anastrozole  (due to swelling of extremities) Anastrozole /Zoladex  toxicities: Tolerating it better with mild to moderate hot flashes and mild to moderate joint stiffness Because of expense related to Zoladex , we switched back to tamoxifen .   Tamoxifen  Toxicities: Tolerating 10 mg dose Lot less swelling of legs and face Hot flashes have improved   Neuropathic symptoms: in arms. Causing difficulties towards the end of the day. Took Gabapentin  100 mg but its not being effective.  Therefore she discontinued it.     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  By the middle to end of the day she is getting a lot of symptoms in the right arm and it is making her miserable and slightly angry which bothers her.   Breast cancer surveillance: 1.  Breast exam 11/20/2023: Benign 2. mammograms 11/14/2022 at [facility]: Benign breast density category B   FSH: 9, Estradiol  74 on 11/14/22 Recommended obtaining FSH and estradiol  levels in 1 year with her GYN to determine if she is menopausal but we can switch her to anastrozole  therapy.   Return to clinic in 1 year for follow-up

## 2023-11-20 NOTE — Progress Notes (Signed)
 Patient Care Team: Rollene Almarie LABOR, MD as PCP - General (Internal Medicine) Rutherford Gain, MD as Consulting Physician (Obstetrics and Gynecology) Arvell Evalene SAUNDERS, DO as Consulting Physician (Family Medicine) Avram Lupita BRAVO, MD (Gastroenterology) Vernetta Berg, MD as Consulting Physician (General Surgery) Shannon Agent, MD as Consulting Physician (Radiation Oncology) Joane Artist RAMAN, MD as Consulting Physician (Family Medicine) Odean Potts, MD as Consulting Physician (Hematology and Oncology) Pickenpack-Cousar, Fannie SAILOR, NP as Nurse Practitioner (Hospice and Palliative Medicine) Patel, Donika K, DO as Consulting Physician (Neurology)  DIAGNOSIS:  Encounter Diagnosis  Name Primary?   Malignant neoplasm of right breast in female, estrogen receptor positive, unspecified site of breast (HCC) Yes    SUMMARY OF ONCOLOGIC HISTORY: Oncology History  Breast cancer (HCC)  11/06/2020 Mammogram   Exam: 3D Mammogram Diagnostic - Right Breast Ultrasound - Right  IMPRESSION: The 0.8 cm irregular mass in the right breast at 9 o'clock is highly suggestive of malignancy. Ultrasound of the right axilla is negative for lymphadenopathy.   11/08/2020 Initial Biopsy   Diagnosis Breast, right, needle core biopsy, 9 o'clock 10cm fn posterior depth - INVASIVE DUCTAL CARCINOMA, GRADE 1/2. - DUCTAL CARCINOMA IN SITU. - SEE MICROSCOPIC DESCRIPTION.  Microscopic Comment The greatest tumor dimension is 0.5 cm.  PROGNOSTIC INDICATORS Results: The tumor cells are NEGATIVE for Her2 (0). Estrogen Receptor: 100%, POSITIVE, STRONG STAINING INTENSITY Progesterone Receptor: 100%, POSITIVE, STRONG STAINING INTENSITY Proliferation Marker Ki67: 1%   11/13/2020 Initial Diagnosis   Malignant neoplasm of upper-outer quadrant of right breast in female, estrogen receptor positive (HCC)   11/15/2020 Cancer Staging   Staging form: Breast, AJCC 8th Edition - Clinical stage from 11/15/2020: Stage IA  (cT1b, cN0, cM0, G1, ER+, PR+, HER2-) - Signed by Layla Sandria BROCKS, MD on 11/15/2020 Stage prefix: Initial diagnosis Histologic grading system: 3 grade system   12/13/2020 Cancer Staging   Staging form: Breast, AJCC 8th Edition - Pathologic stage from 12/13/2020: Stage IA (pT1b, pN0, cM0, G1, ER+, PR+, HER2-) - Signed by Crawford Morna Pickle, NP on 03/20/2021 Stage prefix: Initial diagnosis Histologic grading system: 3 grade system   12/13/2020 Definitive Surgery   FINAL MICROSCOPIC DIAGNOSIS:   A. BREAST, RIGHT, MASTECTOMY:  - Invasive and in situ ductal carcinoma, 0.9 cm.  - Margins not involved.  - Biopsy site and biopsy clip.  - See oncology table.   B. LYMPH NODE, RIGHT AXILLARY #1, SENTINEL, EXCISION:  - One lymph node negative for metastatic carcinoma (0/1).   C. LYMPH NODE, RIGHT AXILLARY #2, SENTINEL, EXCISION:  - One lymph node negative for metastatic carcinoma (0/1).   D. LYMPH NODE, RIGHT AXILLARY, SENTINEL, EXCISION:  - One lymph node negative for metastatic carcinoma (0/1).   E. LYMPH NODE, RIGHT AXILLARY, SENTINEL, EXCISION:  - One lymph node negative for metastatic carcinoma (0/1).   F. LYMPH NODE, RIGHT AXILLARY, SENTINEL, EXCISION:  - One lymph node negative for metastatic carcinoma (0/1).    12/13/2020 Oncotype testing   Oncotype DX was obtained on the final surgical sample and the recurrence score of 8 predicts a risk of recurrence outside the breast over the next 9 years of 3%, if the patient's only systemic therapy is an antiestrogen for 5 years.  It also predicts a <1% benefit from chemotherapy.   01/2021 -  Anti-estrogen oral therapy   Tamoxifen  daily     CHIEF COMPLIANT: Follow-up on tamoxifen  therapy  HISTORY OF PRESENT ILLNESS:   History of Present Illness Donna Robbins is a 53 year  old female with breast cancer on tamoxifen  therapy who presents for follow-up regarding joint pain and medication side effects.  She experiences  persistent joint stiffness and body aches, primarily affecting her knees and ankles, which she attributes to her weight. A flare-up in her previously broken knee was managed with Mobic  for a week, providing significant relief. She uses Tylenol  a couple of times a week to manage pain and maintain her gym routine, which she finds beneficial despite some aggravation of symptoms.  She tracks her food intake to identify dietary triggers for her symptoms but has not found a clear correlation. She attempts to avoid pro-inflammatory foods, such as high-fat diets, red meats, and alcohol, although she occasionally consumes bourbon. She drinks four to five quarts of water daily and walks forty minutes a day, which she finds helpful.     ALLERGIES:  is allergic to iodine and sulfonamide derivatives.  MEDICATIONS:  Current Outpatient Medications  Medication Sig Dispense Refill   Cholecalciferol (VITAMIN D3) 1000 units CAPS Take by mouth 2 (two) times daily.     clindamycin  (CLEOCIN  T) 1 % external solution Apply topically.     clonazePAM  (KLONOPIN ) 0.5 MG tablet Take 1/2-1 tablet (0.25-0.5 mg total) by mouth daily as needed for anxiety 30 tablet 0   meloxicam  (MOBIC ) 15 MG tablet Take 1 tablet (15 mg total) by mouth daily as needed for pain. 30 tablet 2   nitroGLYCERIN  (NITRODUR - DOSED IN MG/24 HR) 0.2 mg/hr patch APPLY 1/4 PATCH DAILY TO TENDON FOR TENDONITIS. (Patient not taking: Reported on 10/13/2023) 30 patch 1   tamoxifen  (NOLVADEX ) 10 MG tablet Take 1 tablet by mouth daily. 90 tablet 3   vitamin B-12 (CYANOCOBALAMIN ) 1000 MCG tablet Take 1,000 mcg by mouth in the morning.     No current facility-administered medications for this visit.    PHYSICAL EXAMINATION: ECOG PERFORMANCE STATUS: 1 - Symptomatic but completely ambulatory  Vitals:   11/20/23 1403  BP: 122/78  Pulse: 87  Resp: 16  Temp: 97.9 F (36.6 C)  SpO2: 98%   Filed Weights   11/20/23 1403  Weight: 203 lb 12.8 oz (92.4 kg)       LABORATORY DATA:  I have reviewed the data as listed    Latest Ref Rng & Units 04/25/2023    8:18 AM 04/17/2022    9:29 AM 01/08/2021    3:33 PM  CMP  Glucose 70 - 99 mg/dL 89  91  86   BUN 6 - 23 mg/dL 13  12  7    Creatinine 0.40 - 1.20 mg/dL 9.38  9.41  9.32   Sodium 135 - 145 mEq/L 140  143  140   Potassium 3.5 - 5.1 mEq/L 3.9  4.7  4.1   Chloride 96 - 112 mEq/L 104  105  104   CO2 19 - 32 mEq/L 29  31  26    Calcium 8.4 - 10.5 mg/dL 9.1  9.2  9.0   Total Protein 6.0 - 8.3 g/dL 7.1  7.4  6.9   Total Bilirubin 0.2 - 1.2 mg/dL 0.4  0.4  0.3   Alkaline Phos 39 - 117 U/L 62  54  52   AST 0 - 37 U/L 18  17  17    ALT 0 - 35 U/L 13  13  15      Lab Results  Component Value Date   WBC 6.0 04/25/2023   HGB 13.1 04/25/2023   HCT 39.5 04/25/2023   MCV 95.9 04/25/2023  PLT 311.0 04/25/2023   NEUTROABS 4.9 01/08/2021    ASSESSMENT & PLAN:  Breast cancer (HCC) 11/08/2020: Clinical T1BN0 stage Ia IDC grade 2, ER/PR positive HER2 negative Ki-67 1% 12/13/2021: Right mastectomy: Grade 1 IDC margins -0/5 lymph nodes negative, Oncotype score 8: 3% ROR Current treatment: Tamoxifen  started 01/23/2021 discontinued and switched to Zoladex  with anastrozole  (due to swelling of extremities) Anastrozole /Zoladex  toxicities: Tolerating it better with mild to moderate hot flashes and mild to moderate joint stiffness Because of expense related to Zoladex , we switched back to tamoxifen .   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    Breast cancer surveillance: mammograms 11/14/2023 at [facility]: Benign breast density category B Recommended guardant for MRD testing   FSH: 9, Estradiol  74 on 11/14/22 At 5 years we will consider doing BCI to determine if she would benefit from extended endocrine therapy   Return to clinic in 1 year for follow-up   Assessment &  Plan Estrogen receptor positive right breast cancer, status post treatment, on adjuvant endocrine therapy On tamoxifen  for 3 years, plan to continue for 5 years. Discussed potential switch to anastrozole  based on BCI test at year 5. Explained CT DNA test for early recurrence detection, covered by insurance. - Continue tamoxifen  therapy. - Perform Breast Cancer Index (BCI) test at year 5 to determine the need for extended endocrine therapy. - Order circulating tumor DNA (CT DNA) test every 6 months to monitor for recurrence. - No need to see the surgeon unless there are new concerns.  Arthralgia and myalgia secondary to endocrine therapy Joint stiffness and muscle pain likely due to tamoxifen . Mobic  effective but used sparingly. Tylenol  used occasionally. Discussed lifestyle and dietary modifications to manage symptoms. - Use Mobic  (meloxicam ) as needed for severe joint pain. - Use Tylenol  occasionally for pain management. - Encourage hydration with 4-5 quarts of water daily. - Encourage regular exercise, including walking 40 minutes a day. - Consider dietary modifications to reduce inflammation.  Lymphedema, right upper extremity, post-breast cancer treatment Performs daily lymphatic massage, helps with swelling. Monitors for changes in breast tissue. - Continue daily lymphatic massage. - Monitor for persistent, consistent, and progressive changes in breast tissue.      No orders of the defined types were placed in this encounter.  The patient has a good understanding of the overall plan. she agrees with it. she will call with any problems that may develop before the next visit here. Total time spent: 30 mins including face to face time and time spent for planning, charting and co-ordination of care   Naomi MARLA Chad, MD 11/20/23

## 2023-11-21 ENCOUNTER — Telehealth: Payer: Self-pay

## 2023-11-21 NOTE — Telephone Encounter (Signed)
 Per md orders entered for [employer] and all supported documents faxed to [phone]. Faxed confirmation was received.

## 2023-11-25 ENCOUNTER — Encounter: Payer: Self-pay | Admitting: Internal Medicine

## 2023-12-03 NOTE — Progress Notes (Unsigned)
 [facility] History and Physical   Primary Care Physician:  Donna Almarie LABOR, Donna [facility]   Reason for Procedure:    Encounter Diagnosis  Name Primary?   Hx of adenomatous polyp of colon Yes     Plan:    Colonoscopy     HPI: Donna Robbins is a 53 y.o. female with a history of removal of a 2 mm adenoma in 2018, presenting for a surveillance colonoscopy exam.  That procedure was completed without difficulty and did also reveal sigmoid diverticulosis and hypertrophied anal papillae.   Past Medical History:  Diagnosis Date   Allergy    Anxiety    Breast cancer (HCC)    Hx of adenomatous polyp of colon 07/18/2016   Irritable bowel syndrome    OBESITY    Sleep apnea    Seen by Dr. Darlean at [facility] eliminated it.    Past Surgical History:  Procedure Laterality Date   BREAST SURGERY  December 13, 2020   CESAREAN SECTION     x's 2   CHOLECYSTECTOMY  03/26/2007   ENT surgery  03/25/2002   MASTECTOMY W/ SENTINEL NODE Robbins Donna 12/13/2020   Procedure: [facility];  Surgeon: Donna Berg, [facility];  Location: [facility];  Robbins: [facility] Robbins;  Laterality: Donna;     Current Outpatient Medications  Medication Sig Dispense Refill   Cholecalciferol (VITAMIN D3) 1000 units CAPS Take by mouth 2 (two) times daily.     clindamycin  (CLEOCIN  T) 1 % external solution Apply topically.     clonazePAM  (KLONOPIN ) 0.5 MG tablet Take 1/2-1 tablet (0.25-0.5 mg total) by mouth daily as needed for anxiety 30 tablet 0   meloxicam  (MOBIC ) 15 MG tablet Take 1 tablet (15 mg total) by mouth daily as needed for pain. 30 tablet 2   tamoxifen  (NOLVADEX ) 10 MG tablet Take 1 tablet by mouth daily. 90 tablet 3   vitamin B-12 (CYANOCOBALAMIN ) 1000 MCG tablet Take 1,000 mcg by mouth in the morning.     No current facility-administered medications for this visit.    Allergies as of 12/04/2023 - Review Complete 11/20/2023  Allergen Reaction Noted    Iodine Itching    Sulfonamide derivatives Itching and Rash     Family History  Problem Relation Age of Onset   Colon polyps Mother    Cancer Mother        Melanoma in the eye   Diabetes Mother    Hyperlipidemia Mother    Kidney disease Mother    Obesity Mother    Cancer Father        Angio sarcoma   Hyperlipidemia Father    ADD / ADHD Father    Anxiety disorder Father    Obesity Brother    Arthritis Maternal Grandmother    Lung cancer Maternal Grandmother    Varicose Veins Maternal Grandmother    Lung cancer Maternal Grandfather    Lung cancer Paternal Grandmother    ADD / ADHD Son    ADD / ADHD Son    Arthritis Other    Colon cancer Neg Hx    Stomach cancer Neg Hx    Rectal cancer Neg Hx    Esophageal cancer Neg Hx    Liver cancer Neg Hx     Social History   Socioeconomic History   Marital status: Married    Spouse name: Not on file   Number of children: 2   Years of education: Not on file   Highest  education level: Bachelor's degree (e.g., BA, AB, BS)  Occupational History   Occupation: [facility]bins    Employer:  [facility]  Tobacco Use   Smoking status: Never   Smokeless tobacco: Never  Vaping Use   Vaping status: Never Used  Substance and Sexual Activity   Alcohol use: Yes    Alcohol/week: 2.0 standard drinks of alcohol    Types: 1 Glasses of wine, 1 Standard drinks Robbins equivalent per week    Comment: occasionally   Drug use: Not Currently    Types: Marijuana    Comment: once a month/ Occasional Edible   Sexual activity: Yes    Birth control/protection: Surgical    Comment: Husband vasectomy  Other Topics Concern   Not on file  Social History Narrative   Donna Handed    Lives in a one story home    Social Drivers of Donna   Robbins Resource Strain: Low Risk  (04/24/2023)   Overall Robbins Resource Strain (CARDIA)    Difficulty of Paying Living Expenses: Not hard at all  Food Insecurity: No Food Insecurity  (04/24/2023)   Hunger Vital Sign    Worried About Running Out of Food in the Last Year: Never true    Ran Out of Food in the Last Year: Never true  Transportation Needs: No Transportation Needs (04/24/2023)   Donna Robbins (Medical): No    Lack of Transportation (Non-Medical): No  Physical Activity: Unknown (04/24/2023)   Exercise Vital Sign    Days of Exercise per Week: 0 days    Minutes of Exercise per Session: Not on file  Stress: Stress Concern Present (04/24/2023)   [facility]na [facility]bins of Occupational [facility] - Occupational Stress Questionnaire    Feeling of Stress : Rather much  Social Connections: Unknown (04/24/2023)   Social Connection and Isolation Panel    Frequency of Communication with Friends and Family: Patient declined    Frequency of Social Gatherings with Friends and Family: Patient declined    Attends Religious Services: 1 to 4 times per year    Active Member of [facility]: Yes    Attends Engineer, structural: More than 4 times per year    Marital Status: Married  Catering Robbins Violence: Not At Risk (03/22/2021)   Humiliation, Afraid, Rape, and Kick questionnaire    Fear of Current Robbins Ex-Partner: No    Emotionally Abused: No    Physically Abused: No    Sexually Abused: No    Review of Systems: Positive for *** All other review of systems negative except as mentioned in the HPI.  Physical Exam: Vital signs There were no vitals taken for this visit.  Donna Robbins:   Alert,  Well-developed, well-nourished, pleasant and cooperative in NAD Lungs:  Clear throughout to auscultation.   Heart:  Regular rate and rhythm; no murmurs, clicks, rubs,  Robbins gallops. Abdomen:  Soft, nontender and nondistended. Normal bowel sounds.   Neuro/Psych:  Alert and cooperative. Normal mood and affect. A and O x 3   @Donna Robbins  Donna Commander, Donna Robbins, [facility] Robbins [phone] (pager) 12/03/2023 4:54 PM@

## 2023-12-04 ENCOUNTER — Encounter: Payer: Self-pay | Admitting: Internal Medicine

## 2023-12-04 ENCOUNTER — Ambulatory Visit: Admitting: Internal Medicine

## 2023-12-04 VITALS — BP 118/82 | HR 87 | Temp 98.2°F | Resp 16 | Ht 63.0 in | Wt 198.6 lb

## 2023-12-04 DIAGNOSIS — D123 Benign neoplasm of transverse colon: Secondary | ICD-10-CM | POA: Diagnosis not present

## 2023-12-04 DIAGNOSIS — Z860101 Personal history of adenomatous and serrated colon polyps: Secondary | ICD-10-CM | POA: Diagnosis not present

## 2023-12-04 DIAGNOSIS — F419 Anxiety disorder, unspecified: Secondary | ICD-10-CM | POA: Diagnosis not present

## 2023-12-04 DIAGNOSIS — Z860100 Personal history of colon polyps, unspecified: Secondary | ICD-10-CM

## 2023-12-04 DIAGNOSIS — D125 Benign neoplasm of sigmoid colon: Secondary | ICD-10-CM

## 2023-12-04 DIAGNOSIS — K5730 Diverticulosis of large intestine without perforation or abscess without bleeding: Secondary | ICD-10-CM | POA: Diagnosis not present

## 2023-12-04 DIAGNOSIS — Z1211 Encounter for screening for malignant neoplasm of colon: Secondary | ICD-10-CM | POA: Diagnosis not present

## 2023-12-04 DIAGNOSIS — K6289 Other specified diseases of anus and rectum: Secondary | ICD-10-CM | POA: Diagnosis not present

## 2023-12-04 DIAGNOSIS — G4730 Sleep apnea, unspecified: Secondary | ICD-10-CM | POA: Diagnosis not present

## 2023-12-04 DIAGNOSIS — E669 Obesity, unspecified: Secondary | ICD-10-CM | POA: Diagnosis not present

## 2023-12-04 MED ORDER — SODIUM CHLORIDE 0.9 % IV SOLN: 500.0000 mL | Freq: Once | INTRAVENOUS | Status: DC

## 2023-12-04 NOTE — Progress Notes (Signed)
 A/o x 3, VSS, gd SR's, pleased with anesthesia, report to RN

## 2023-12-04 NOTE — Patient Instructions (Addendum)
 Two small polyps removed today.  You still  have diverticulosis - thickened muscle rings and pouches in the colon wall. Please read the handout about this condition.  I will let you know pathology results and when to have another routine colonoscopy by mail and/or My Chart.  I appreciate the opportunity to care for you. Lupita CHARLENA Commander, MD, [facility]    Thank you for letting us  take care of your healthcare needs today. Please see handouts given to you on Polyps and Diverticulosis. No aspirin products for 2 weeks.  YOU HAD AN ENDOSCOPIC PROCEDURE TODAY AT THE [facility]:   Refer to the procedure report that was given to you for any specific questions about what was found during the examination.  If the procedure report does not answer your questions, please call your gastroenterologist to clarify.  If you requested that your care partner not be given the details of your procedure findings, then the procedure report has been included in a sealed envelope for you to review at your convenience later.  YOU SHOULD EXPECT: Some feelings of bloating in the abdomen. Passage of more gas than usual.  Walking can help get rid of the air that was put into your GI tract during the procedure and reduce the bloating. If you had a lower endoscopy (such as a colonoscopy or flexible sigmoidoscopy) you may notice spotting of blood in your stool or on the toilet paper. If you underwent a bowel prep for your procedure, you may not have a normal bowel movement for a few days.  Please Note:  You might notice some irritation and congestion in your nose or some drainage.  This is from the oxygen used during your procedure.  There is no need for concern and it should clear up in a day or so.  SYMPTOMS TO REPORT IMMEDIATELY:  Following lower endoscopy (colonoscopy or flexible sigmoidoscopy):  Excessive amounts of blood in the stool  Significant tenderness or worsening of abdominal pains  Swelling of the  abdomen that is new, acute  Fever of 100F or higher   For urgent or emergent issues, a gastroenterologist can be reached at any hour by calling [phone]. Do not use MyChart messaging for urgent concerns.    DIET:  We do recommend a small meal at first, but then you may proceed to your regular diet.  Drink plenty of fluids but you should avoid alcoholic beverages for 24 hours.  ACTIVITY:  You should plan to take it easy for the rest of today and you should NOT DRIVE or use heavy machinery until tomorrow (because of the sedation medicines used during the test).    FOLLOW UP: Our staff will call the number listed on your records the next business day following your procedure.  We will call around 7:15- 8:00 am to check on you and address any questions or concerns that you may have regarding the information given to you following your procedure. If we do not reach you, we will leave a message.     If any biopsies were taken you will be contacted by phone or by letter within the next 1-3 weeks.  Please call us  at [phone] if you have not heard about the biopsies in 3 weeks.    SIGNATURES/CONFIDENTIALITY: You and/or your care partner have signed paperwork which will be entered into your electronic medical record.  These signatures attest to the fact that that the information above on your After Visit Summary has been  reviewed and is understood.  Full responsibility of the confidentiality of this discharge information lies with you and/or your care-partner.

## 2023-12-04 NOTE — Op Note (Signed)
 [facility] Patient Name: Donna Robbins Procedure Date: 12/04/2023 10:01 AM MRN: [id number] Endoscopist: Lupita FORBES Commander , MD, [id number] Age: 53 Referring MD:  Date of Birth: Feb 11, 1971 Gender: Female Account #: [id number] Procedure:                Colonoscopy Indications:              Surveillance: Personal history of adenomatous                            polyps on last colonoscopy > 5 years ago, Last                            colonoscopy: 2018 Medicines:                Monitored Anesthesia Care Procedure:                Pre-Anesthesia Assessment:                           - Prior to the procedure, a History and Physical                            was performed, and patient medications and                            allergies were reviewed. The patient's tolerance of                            previous anesthesia was also reviewed. The risks                            and benefits of the procedure and the sedation                            options and risks were discussed with the patient.                            All questions were answered, and informed consent                            was obtained. Prior Anticoagulants: The patient has                            taken no anticoagulant or antiplatelet agents. ASA                            Grade Assessment: II - A patient with mild systemic                            disease. After reviewing the risks and benefits,                            the patient was deemed in satisfactory condition to  undergo the procedure.                           After obtaining informed consent, the colonoscope                            was passed under direct vision. Throughout the                            procedure, the patient's blood pressure, pulse, and                            oxygen saturations were monitored continuously. The                            Olympus Scope [id number] was introduced  through the                            anus and advanced to the the cecum, identified by                            appendiceal orifice and ileocecal valve. The                            colonoscopy was performed without difficulty. The                            patient tolerated the procedure well. The quality                            of the bowel preparation was good. The ileocecal                            valve, appendiceal orifice, and rectum were                            photographed. The bowel preparation used was SUPREP                            via split dose instruction. Scope In: 10:24:37 AM Scope Out: 10:43:00 AM Scope Withdrawal Time: 0 hours 15 minutes 51 seconds  Total Procedure Duration: 0 hours 18 minutes 23 seconds  Findings:                 The perianal and digital rectal examinations were                            normal.                           An 8 mm polyp was found in the sigmoid colon. The                            polyp was pedunculated. The polyp was removed with  a hot snare. Resection and retrieval were complete.                            Verification of patient identification for the                            specimen was done. Estimated blood loss: none.                           A 4 mm polyp was found in the hepatic flexure. The                            polyp was sessile. The polyp was removed with a                            cold snare. Resection and retrieval were complete.                            Verification of patient identification for the                            specimen was done. Estimated blood loss was minimal.                           Multiple diverticula were found in the sigmoid                            colon.                           Anal papilla(e) were hypertrophied.                           The exam was otherwise without abnormality on                            direct and retroflexion  views. Complications:            No immediate complications. Estimated Blood Loss:     Estimated blood loss was minimal. Impression:               - One 8 mm polyp in the sigmoid colon, removed with                            a hot snare. Resected and retrieved.                           - One 4 mm polyp at the hepatic flexure, removed                            with a cold snare. Resected and retrieved.                           - Diverticulosis in the sigmoid colon.                           -  Anal papilla(e) were hypertrophied.                           - The examination was otherwise normal on direct                            and retroflexion views.                           - Personal history of colonic polyp - 2 mm adenoma                            removed 2018. Recommendation:           - Patient has a contact number available for                            emergencies. The signs and symptoms of potential                            delayed complications were discussed with the                            patient. Return to normal activities tomorrow.                            Written discharge instructions were provided to the                            patient.                           - Resume previous diet.                           - Continue present medications.                           - No aspirin, ibuprofen, naproxen, or other                            non-steroidal anti-inflammatory drugs for 2 weeks                            after polyp removal. Lupita FORBES Commander, MD 12/04/2023 10:53:01 AM This report has been signed electronically.

## 2023-12-04 NOTE — Progress Notes (Signed)
 Called to room to assist during endoscopic procedure.  Patient ID and intended procedure confirmed with present staff. Received instructions for my participation in the procedure from the performing physician.

## 2023-12-05 ENCOUNTER — Telehealth: Payer: Self-pay

## 2023-12-05 NOTE — Telephone Encounter (Signed)
 LMOM

## 2023-12-08 ENCOUNTER — Ambulatory Visit: Payer: Self-pay | Admitting: Internal Medicine

## 2023-12-08 DIAGNOSIS — Z860101 Personal history of adenomatous and serrated colon polyps: Secondary | ICD-10-CM

## 2023-12-08 LAB — SURGICAL PATHOLOGY

## 2023-12-11 DIAGNOSIS — C50911 Malignant neoplasm of unspecified site of right female breast: Secondary | ICD-10-CM | POA: Diagnosis not present

## 2023-12-25 ENCOUNTER — Encounter: Payer: Self-pay | Admitting: Hematology and Oncology

## 2024-01-13 DIAGNOSIS — Z1331 Encounter for screening for depression: Secondary | ICD-10-CM | POA: Diagnosis not present

## 2024-01-13 DIAGNOSIS — Z01419 Encounter for gynecological examination (general) (routine) without abnormal findings: Secondary | ICD-10-CM | POA: Diagnosis not present

## 2024-02-02 ENCOUNTER — Other Ambulatory Visit: Payer: Self-pay | Admitting: Internal Medicine

## 2024-02-05 ENCOUNTER — Other Ambulatory Visit (HOSPITAL_COMMUNITY): Payer: Self-pay

## 2024-02-05 MED ORDER — CLONAZEPAM 0.5 MG PO TABS
0.2500 mg | ORAL_TABLET | Freq: Every day | ORAL | 0 refills | Status: DC
  Filled 2024-02-05: qty 30, 30d supply, fill #0

## 2024-03-22 ENCOUNTER — Other Ambulatory Visit: Payer: Self-pay

## 2024-03-22 ENCOUNTER — Other Ambulatory Visit: Payer: Self-pay | Admitting: Internal Medicine

## 2024-03-23 ENCOUNTER — Other Ambulatory Visit (HOSPITAL_COMMUNITY): Payer: Self-pay

## 2024-03-23 MED ORDER — CLONAZEPAM 0.5 MG PO TABS
0.2500 mg | ORAL_TABLET | Freq: Every day | ORAL | 1 refills | Status: AC
  Filled 2024-03-23: qty 30, 30d supply, fill #0

## 2024-03-30 ENCOUNTER — Other Ambulatory Visit (HOSPITAL_COMMUNITY): Payer: Self-pay

## 2024-04-30 ENCOUNTER — Other Ambulatory Visit (HOSPITAL_COMMUNITY): Payer: Self-pay

## 2024-04-30 ENCOUNTER — Ambulatory Visit: Admitting: Internal Medicine

## 2024-04-30 ENCOUNTER — Encounter: Payer: Self-pay | Admitting: Internal Medicine

## 2024-04-30 VITALS — BP 112/80 | HR 101 | Temp 98.5°F | Ht 63.0 in | Wt 203.4 lb

## 2024-04-30 DIAGNOSIS — H66001 Acute suppurative otitis media without spontaneous rupture of ear drum, right ear: Secondary | ICD-10-CM

## 2024-04-30 DIAGNOSIS — H6991 Unspecified Eustachian tube disorder, right ear: Secondary | ICD-10-CM

## 2024-04-30 MED ORDER — NEOMYCIN-POLYMYXIN-HC 3.5-10000-1 OT SOLN
3.0000 [drp] | Freq: Three times a day (TID) | OTIC | 0 refills | Status: AC
  Filled 2024-04-30: qty 10, 22d supply, fill #0

## 2024-04-30 NOTE — Patient Instructions (Signed)
 We have sent in the ear drops to use 3 drops 3 times a day for 1 week.. Then use as needed for 3-7 days at a time.

## 2024-04-30 NOTE — Progress Notes (Signed)
 "  Subjective:   Patient ID: Donna Robbins, female    DOB: 11/30/1970, 54 y.o.   MRN: [id number]  Discussed the use of AI scribe software for clinical note transcription with the patient, who gave verbal consent to proceed.  History of Present Illness Donna Robbins is a 54 year old female with a history of cancer who presents with ear discomfort and hearing issues.  She has been experiencing ear discomfort for the past week, describing her ears as 'crusty,' 'full,' and 'clogged,' with a sensation of ringing. There is difficulty hearing, particularly on one side, impacting her ability to interact and engage in daily activities. She has been using Flonase twice daily for a week and Sudafed, but these have not provided significant relief. She avoids Benadryl due to drowsiness. Ear drops with peroxide have been used to soften earwax, which she flushed out last night, resulting in some wax removal but not enough to alleviate her symptoms.  Her ear is regularly wet and itchy, raising concerns about a possible yeast infection or blockage. She mentions overlapping symptoms, including puffiness that preceded the ear issues, and drainage down the back of her throat without nasal discharge. No fever, chills, or body aches are present, but there is increased discomfort.  She has a history of cancer and is currently on tamoxifen . In terms of medication, she has been using Flonase and Sudafed, and she has tried ear drops with peroxide.  Review of Systems  Constitutional: Negative.   HENT:  Positive for congestion, ear pain and postnasal drip.   Eyes: Negative.   Respiratory:  Negative for cough, chest tightness and shortness of breath.   Cardiovascular:  Negative for chest pain, palpitations and leg swelling.  Gastrointestinal:  Negative for abdominal distention, abdominal pain, constipation, diarrhea, nausea and vomiting.  Musculoskeletal: Negative.   Skin: Negative.   Neurological:  Negative.   Psychiatric/Behavioral: Negative.      Objective:  Physical Exam Constitutional:      Appearance: She is well-developed.  HENT:     Head: Normocephalic and atraumatic.     Comments: Right TM bulging with cloudy fluid     Left Ear: Tympanic membrane normal.  Cardiovascular:     Rate and Rhythm: Normal rate and regular rhythm.  Pulmonary:     Effort: Pulmonary effort is normal. No respiratory distress.     Breath sounds: Normal breath sounds. No wheezing or rales.  Abdominal:     General: Bowel sounds are normal. There is no distension.     Palpations: Abdomen is soft.     Tenderness: There is no abdominal tenderness.  Musculoskeletal:     Cervical back: Normal range of motion.  Skin:    General: Skin is warm and dry.  Neurological:     Mental Status: She is alert and oriented to person, place, and time.     Coordination: Coordination normal.     Vitals:   04/30/24 0740  BP: 112/80  Pulse: (!) 101  Temp: 98.5 F (36.9 C)  TempSrc: Oral  SpO2: 98%  Weight: 203 lb 6.4 oz (92.3 kg)  Height: 5' 3 (1.6 m)    Assessment and Plan Assessment & Plan Chronic right ear effusion with otitis media and Eustachian tube dysfunction   She experiences fullness, clogging, and tinnitus in the right ear with fluid and small amount wax present, but no significant infection. Possible lymphatic involvement may affect drainage, and a yeast infection is considered in the differential. Current management  with Flonase and Sudafed is ineffective. Prescribed ear drops containing antifungal, antibacterial, and steroid to be used three drops TID for one week, then paused for one to two weeks. If symptoms recur, use for three to seven days as needed. Consider ENT referral if no improvement.   "

## 2024-11-18 ENCOUNTER — Ambulatory Visit: Admitting: Hematology and Oncology

## 2024-11-19 ENCOUNTER — Ambulatory Visit: Admitting: Hematology and Oncology
